# Patient Record
Sex: Female | Born: 1990 | Race: White | Hispanic: No | Marital: Married | Smoking: Never smoker
Health system: Southern US, Community
[De-identification: ages and names within clinical notes are randomized; demographics above are authoritative.]

## PROBLEM LIST (undated history)

## (undated) ENCOUNTER — Inpatient Hospital Stay: Payer: Self-pay

## (undated) DIAGNOSIS — Z87442 Personal history of urinary calculi: Secondary | ICD-10-CM

## (undated) DIAGNOSIS — F32A Depression, unspecified: Secondary | ICD-10-CM

## (undated) DIAGNOSIS — N200 Calculus of kidney: Secondary | ICD-10-CM

## (undated) DIAGNOSIS — F419 Anxiety disorder, unspecified: Secondary | ICD-10-CM

## (undated) DIAGNOSIS — O26839 Pregnancy related renal disease, unspecified trimester: Secondary | ICD-10-CM

## (undated) DIAGNOSIS — D649 Anemia, unspecified: Secondary | ICD-10-CM

## (undated) HISTORY — DX: Pregnancy related renal disease, unspecified trimester: O26.839

## (undated) HISTORY — PX: WISDOM TOOTH EXTRACTION: SHX21

## (undated) HISTORY — DX: Calculus of kidney: N20.0

---

## 2014-09-12 ENCOUNTER — Emergency Department
Admission: EM | Admit: 2014-09-12 | Discharge: 2014-09-12 | Disposition: A | Discharge status: Home / Self Care | Payer: Managed Care, Other (non HMO) | Attending: Emergency Medicine | Admitting: Emergency Medicine

## 2014-09-12 ENCOUNTER — Encounter: Payer: Self-pay | Admitting: *Deleted

## 2014-09-12 ENCOUNTER — Emergency Department: Payer: Managed Care, Other (non HMO)

## 2014-09-12 DIAGNOSIS — R1032 Left lower quadrant pain: Secondary | ICD-10-CM | POA: Diagnosis present

## 2014-09-12 DIAGNOSIS — R319 Hematuria, unspecified: Secondary | ICD-10-CM

## 2014-09-12 DIAGNOSIS — Z79899 Other long term (current) drug therapy: Secondary | ICD-10-CM | POA: Diagnosis not present

## 2014-09-12 DIAGNOSIS — N200 Calculus of kidney: Secondary | ICD-10-CM

## 2014-09-12 DIAGNOSIS — Z3202 Encounter for pregnancy test, result negative: Secondary | ICD-10-CM | POA: Insufficient documentation

## 2014-09-12 DIAGNOSIS — R109 Unspecified abdominal pain: Secondary | ICD-10-CM

## 2014-09-12 LAB — URINALYSIS COMPLETE WITH MICROSCOPIC (ARMC ONLY)
Bilirubin Urine: NEGATIVE
GLUCOSE, UA: NEGATIVE mg/dL
Leukocytes, UA: NEGATIVE
Nitrite: NEGATIVE
Protein, ur: 30 mg/dL — AB
SPECIFIC GRAVITY, URINE: 1.024 (ref 1.005–1.030)
pH: 7 (ref 5.0–8.0)

## 2014-09-12 LAB — COMPREHENSIVE METABOLIC PANEL
ALT: 37 U/L (ref 14–54)
AST: 27 U/L (ref 15–41)
Albumin: 4.8 g/dL (ref 3.5–5.0)
Alkaline Phosphatase: 71 U/L (ref 38–126)
Anion gap: 13 (ref 5–15)
BUN: 16 mg/dL (ref 6–20)
CALCIUM: 9.7 mg/dL (ref 8.9–10.3)
CO2: 23 mmol/L (ref 22–32)
Chloride: 105 mmol/L (ref 101–111)
Creatinine, Ser: 0.89 mg/dL (ref 0.44–1.00)
GFR calc Af Amer: >60 mL/min (ref >60)
GFR calc non Af Amer: >60 mL/min (ref >60)
Glucose, Bld: 106 mg/dL — ABNORMAL HIGH (ref 65–99)
Potassium: 3.6 mmol/L (ref 3.5–5.1)
Sodium: 141 mmol/L (ref 135–145)
Total Bilirubin: 1.7 mg/dL — ABNORMAL HIGH (ref 0.3–1.2)
Total Protein: 7.9 g/dL (ref 6.5–8.1)

## 2014-09-12 LAB — CBC
HEMATOCRIT: 41.9 % (ref 35.0–47.0)
HEMOGLOBIN: 14.1 g/dL (ref 12.0–16.0)
MCH: 29.4 pg (ref 26.0–34.0)
MCHC: 33.6 g/dL (ref 32.0–36.0)
MCV: 87.4 fL (ref 80.0–100.0)
Platelets: 342 10*3/uL (ref 150–440)
RBC: 4.8 MIL/uL (ref 3.80–5.20)
RDW: 12.3 % (ref 11.5–14.5)
WBC: 7.9 10*3/uL (ref 3.6–11.0)

## 2014-09-12 LAB — POCT PREGNANCY, URINE: PREG TEST UR: NEGATIVE

## 2014-09-12 MED ORDER — HYDROCODONE-ACETAMINOPHEN 5-325 MG PO TABS: 1.0000 | ORAL_TABLET | ORAL | Status: DC | PRN

## 2014-09-12 MED ORDER — ACYCLOVIR 400 MG PO TABS: 800.0000 mg | ORAL_TABLET | Freq: Every day | ORAL | Status: DC

## 2014-09-12 MED ORDER — ONDANSETRON HCL 4 MG/2ML IJ SOLN
4.0000 mg | Freq: Once | INTRAMUSCULAR | Status: AC
  Administered 2014-09-12: 4 mg via INTRAVENOUS

## 2014-09-12 MED ORDER — KETOROLAC TROMETHAMINE 10 MG PO TABS: 10.0000 mg | ORAL_TABLET | Freq: Three times a day (TID) | ORAL | Status: DC

## 2014-09-12 MED ORDER — SODIUM CHLORIDE 0.9 % IV SOLN
1000.0000 mL | Freq: Once | INTRAVENOUS | Status: AC
  Administered 2014-09-12: 1000 mL via INTRAVENOUS

## 2014-09-12 MED ORDER — ONDANSETRON HCL 4 MG/2ML IJ SOLN
INTRAMUSCULAR | Status: AC
  Filled 2014-09-12: qty 2

## 2014-09-12 MED ORDER — KETOROLAC TROMETHAMINE 60 MG/2ML IM SOLN
INTRAMUSCULAR | Status: AC
  Filled 2014-09-12: qty 2

## 2014-09-12 MED ORDER — KETOROLAC TROMETHAMINE 30 MG/ML IJ SOLN
30.0000 mg | Freq: Once | INTRAMUSCULAR | Status: AC
  Administered 2014-09-12: 30 mg via INTRAVENOUS

## 2014-09-12 NOTE — ED Notes (Signed)
Has ahd same pain off and on for past month, states when she has bm pain lessons, pain is worse now, had bm yesterday but pain started later

## 2014-09-12 NOTE — ED Notes (Signed)
States pain much improved, has vomired twice, face has more color in it

## 2014-09-12 NOTE — ED Provider Notes (Signed)
[facility] Emergency Department Provider Note ____________________________________________  Time seen: 1555  I have reviewed the triage vital signs and the nursing notes.  HISTORY  Chief Complaint Flank Pain    HPI Sherry Townsend is a 24 y.o. female who reports to the ED after the direct referral from the [facility], sighting abdominal pain, nausea, and vomiting, with sudden onset this morning. She reports her last oral intake was [employer] meal last night, she went to bed without discomfort, and awoke initially without any discomfort. She describes pain to the left lower quadrant with some referral into the left pelvic region as well. Her pain at onset was 8 out of 10, and now after initial triage fluids and IV medications she rates her pain at a 1 out of 10. She noted 3 episodes of nausea and vomiting today. She states that this pain is not related to her and denies any known history of kidneys stones, ovarian cysts, endometriosis or uterine fibroids.  History reviewed. No pertinent past medical history.  There are no active problems to display for this patient.  History reviewed. No pertinent past surgical history.  Current Outpatient Rx  Name  Route  Sig  Dispense  Refill  . acyclovir (ZOVIRAX) 400 MG tablet   Oral   Take 2 tablets (800 mg total) by mouth 5 (five) times daily.   70 tablet   0   . HYDROcodone-acetaminophen (NORCO) 5-325 MG per tablet   Oral   Take 1 tablet by mouth every 4 (four) hours as needed for moderate pain.   6 tablet   0   . ketorolac (TORADOL) 10 MG tablet   Oral   Take 1 tablet (10 mg total) by mouth every 8 (eight) hours.   15 tablet   0    Allergies Review of patient's allergies indicates no known allergies.  No family history on file.  Social History History  Substance Use Topics  . Smoking status: Never Smoker   . Smokeless tobacco: Not on file  . Alcohol Use: Yes   Review of  Systems  Constitutional: Negative for fever. Eyes: Negative for visual changes. ENT: Negative for sore throat. Cardiovascular: Negative for chest pain. Respiratory: Negative for shortness of breath. Gastrointestinal: Positive for abdominal pain, nausea and vomiting, which are intermittent and currently resolved. No diarrhea. Genitourinary: Negative for dysuria. Musculoskeletal: Negative for back pain. Skin: Negative for rash. Neurological: Negative for headaches, focal weakness or numbness. ____________________________________________  PHYSICAL EXAM:  VITAL SIGNS: ED Triage Vitals  Enc Vitals Group     BP 09/12/14 1000 115/65 mmHg     Pulse Rate 09/12/14 1000 95     Resp --      Temp 09/12/14 1000 97.4 F (36.3 C)     Temp Source 09/12/14 1000 Oral     SpO2 09/12/14 1000 100 %     Weight 09/12/14 1000 140 lb (63.504 kg)     Height 09/12/14 1000  (1.626 m)     Head Cir --      Peak Flow --      Pain Score 09/12/14 1458 2     Pain Loc --      Pain Edu? --      Excl. in GC? --    Constitutional: Alert and oriented. Well appearing and in no distress. Eyes: Conjunctivae are normal. PERRL. Normal extraocular movements. ENT   Head: Normocephalic and atraumatic.   Nose: No congestion/rhinnorhea.   Mouth/Throat: Mucous membranes are  moist.   Neck: No stridor. Hematological/Lymphatic/Immunilogical: No cervical lymphadenopathy. Cardiovascular: Normal rate, regular rhythm.  Respiratory: Normal respiratory effort.No wheezes/rales/rhonchi. Gastrointestinal: Soft and nontender. No distention. Minimal tenderness to deep palpation over the left lower quad into the left pelvic region. No CVA tenderness. Musculoskeletal: Nontender with normal range of motion in all extremities. Neurologic:  Normal speech and language. No gross focal neurologic deficits are appreciated. Skin:  Skin is warm, dry and intact. No rash noted. Psychiatric: Mood and affect are normal. Patient  exhibits appropriate insight and judgment. ___________________________________________   LABS (pertinent positives/negatives) Labs Reviewed  COMPREHENSIVE METABOLIC PANEL - Abnormal; Notable for the following:    Glucose, Bld 106 (*)    Total Bilirubin 1.7 (*)    All other components within normal limits  URINALYSIS COMPLETEWITH MICROSCOPIC ([facility] ONLY) - Abnormal; Notable for the following:    Color, Urine YELLOW (*)    APPearance HAZY (*)    Ketones, ur TRACE (*)    Hgb urine dipstick 2+ (*)    Protein, ur 30 (*)    Bacteria, UA RARE (*)    Squamous Epithelial / LPF 0-5 (*)    All other components within normal limits  CBC  POC URINE PREG, ED  POCT PREGNANCY, URINE  ____________________________________________   CT Abd/Pelvis - Stone Protocol IMPRESSION: Bilateral nephrolithiasis. No ureteral stones or hydronephrosis ____________________________________________  PROCEDURES  Zofran 4 mg IVP x 2 NS 1000 ml bolus Toradol 30 mg IVP ____________________________________________  INITIAL IMPRESSION / ASSESSMENT AND PLAN / ED COURSE  Microscopic hematuria and left flank pain likely due to kidney stones. Radiology results confirm renal stones bilaterally, not greater than 7mm, without obstruction. Patient reports complete resolution of pain at time of discharge. Patient given home management instructions and Norco, Toradol, and Zofran for acute or recurrent symptoms.  ____________________________________________  FINAL CLINICAL IMPRESSION(S) / ED DIAGNOSES  Final diagnoses:  Nephrolithiasis     Lissa HoardJenise V Bacon Gwenna Fuston, PA-C 09/12/14 1846  Governor Rooksebecca Lord, MD 09/13/14 [phone]

## 2014-09-12 NOTE — Discharge Instructions (Signed)
Kidney Stones °Kidney stones (urolithiasis) are deposits that form inside your kidneys. The intense pain is caused by the stone moving through the urinary tract. When the stone moves, the ureter goes into spasm around the stone. The stone is usually passed in the urine.  °CAUSES  °· A disorder that makes certain neck glands produce too much parathyroid hormone (primary hyperparathyroidism). °· A buildup of uric acid crystals, similar to gout in your joints. °· Narrowing (stricture) of the ureter. °· A kidney obstruction present at birth (congenital obstruction). °· Previous surgery on the kidney or ureters. °· Numerous kidney infections. °SYMPTOMS  °· Feeling sick to your stomach (nauseous). °· Throwing up (vomiting). °· Blood in the urine (hematuria). °· Pain that usually spreads (radiates) to the groin. °· Frequency or urgency of urination. °DIAGNOSIS  °· Taking a history and physical exam. °· Blood or urine tests. °· CT scan. °· Occasionally, an examination of the inside of the urinary bladder (cystoscopy) is performed. °TREATMENT  °· Observation. °· Increasing your fluid intake. °· Extracorporeal shock wave lithotripsy--This is a noninvasive procedure that uses shock waves to break up kidney stones. °· Surgery may be needed if you have severe pain or persistent obstruction. There are various surgical procedures. Most of the procedures are performed with the use of small instruments. Only small incisions are needed to accommodate these instruments, so recovery time is minimized. °The size, location, and chemical composition are all important variables that will determine the proper choice of action for you. Talk to your health care provider to better understand your situation so that you will minimize the risk of injury to yourself and your kidney.  °HOME CARE INSTRUCTIONS  °· Drink enough water and fluids to keep your urine clear or pale yellow. This will help you to pass the stone or stone fragments. °· Strain  all urine through the provided strainer. Keep all particulate matter and stones for your health care provider to see. The stone causing the pain may be as small as a grain of salt. It is very important to use the strainer each and every time you pass your urine. The collection of your stone will allow your health care provider to analyze it and verify that a stone has actually passed. The stone analysis will often identify what you can do to reduce the incidence of recurrences. °· Only take over-the-counter or prescription medicines for pain, discomfort, or fever as directed by your health care provider. °· Make a follow-up appointment with your health care provider as directed. °· Get follow-up X-rays if required. The absence of pain does not always mean that the stone has passed. It may have only stopped moving. If the urine remains completely obstructed, it can cause loss of kidney function or even complete destruction of the kidney. It is your responsibility to make sure X-rays and follow-ups are completed. Ultrasounds of the kidney can show blockages and the status of the kidney. Ultrasounds are not associated with any radiation and can be performed easily in a matter of minutes. °SEEK MEDICAL CARE IF: °· You experience pain that is progressive and unresponsive to any pain medicine you have been prescribed. °SEEK IMMEDIATE MEDICAL CARE IF:  °· Pain cannot be controlled with the prescribed medicine. °· You have a fever or shaking chills. °· The severity or intensity of pain increases over 18 hours and is not relieved by pain medicine. °· You develop a new onset of abdominal pain. °· You feel faint or pass out. °·   You are unable to urinate. MAKE SURE YOU:   Understand these instructions.  Will watch your condition.  Will get help right away if you are not doing well or get worse. Document Released: 03/28/2005 Document Revised: 11/28/2012 Document Reviewed: 08/29/2012 [facility] Patient Information 2015  [place]. This information is not intended to replace advice given to you by your health care provider. Make sure you discuss any questions you have with your health care provider.  Your CT scan confirmed kidney stones on both sides.  The largest measures 7mm, and the other 5mm.  These should pass without much difficulty.  Take the prescription meds as directed.  [facility] your provider [facility]. Hydrate well enough to keep urine clear. Strain urine if symptoms return.

## 2014-09-13 ENCOUNTER — Telehealth: Payer: Self-pay | Admitting: Emergency Medicine

## 2014-09-13 ENCOUNTER — Other Ambulatory Visit: Payer: Self-pay | Admitting: Emergency Medicine

## 2014-09-13 MED ORDER — ONDANSETRON HCL 4 MG PO TABS: 4.0000 mg | ORAL_TABLET | Freq: Three times a day (TID) | ORAL | Status: DC | PRN

## 2014-09-13 NOTE — ED Provider Notes (Signed)
-----------------------------------------   10:06 AM on 09/13/2014 -----------------------------------------  Nurse received phone call from patient patient was dispensed a prescription for acyclovir, however I reviewed the chart and there was no indication that this wasn't intended prescription. The a second or pressure and was canceled. The PAs note indicated the patient should receive Zofran and she did not. I did write for this prescription and the nurse did call this information. I also reviewed the chart closely and added on a urine culture given the presumptive diagnosis was possible kidney stone given the microhematuria but no ureterolithiasis seen specifically on the CT scan I chose to not on the urine culture.  Governor Rooksebecca Edwin Cherian, MD 09/13/14 1007

## 2014-09-14 LAB — URINE CULTURE

## 2015-04-12 NOTE — L&D Delivery Note (Signed)
Obstetrical Delivery Note   Date of Delivery:   09/20/2015 Primary OB:   [facility] Gestational Age/EDD: 2267w0d (Dated by 7wk3d ultrasound) Antepartum complications: anemia and nephrolithiasis  Delivered By:   Farrel ConnersGUTIERREZ, Jamilla Galli, CNM  Delivery Type:   spontaneous vaginal delivery after unsuccessful vacuum assisted attempt from outlet with two pulls and two pop offs. Procedure Details:   Called to see patient as fetal tachycardia had worsened to 200-210. Patient had already received a dose of Ampicillin and Gentamycin as well as 650  Mgm Tylenol. Station was +3. After bladder was drained of urine a flat Kiwi vacuum was applied to the fetal head. Vacuum was abandoned after two pulls and two pop offs. With good maternal effort, a viable female infant was born at [id number] in LOP with left nuchal hand. Baby was trying to cry and grimace and nares and mouth were suctioned with a bulb and the cord was clamped and then cut by FOB. Baby to warmer and awaiting neonatal team. Cord gases were obtained. Placenta delivered spontaneously intact. Uterine atony followed and was resolved with evacuation of clots from the lower uterine segment, bimanual, IV Pitocin and Cytotec 800 mcg PR. Anesthesia:    epidural and local for repair Intrapartum complications: Chorio/ thick meconium stained amniotic fluid/ fetal tachycardia/ maternal fever GBS:    negative Laceration:    Periurethral on left, superficial and small lac at introitus repaired for hemostasis. Episiotomy:    none Placenta:    Via active 3rd stage. To pathology: yes Estimated Blood Loss:  450ml  Baby:    Liveborn female, Apgars 6/8, weight 3600 gms (7#15oz)/ Margot AblesParker    Penni Penado, Jill SideOLLEEN, CNM

## 2015-04-20 ENCOUNTER — Emergency Department
Admission: EM | Admit: 2015-04-20 | Discharge: 2015-04-20 | Disposition: A | Discharge status: Home / Self Care | Payer: Managed Care, Other (non HMO) | Attending: Emergency Medicine | Admitting: Emergency Medicine

## 2015-04-20 ENCOUNTER — Encounter: Payer: Self-pay | Admitting: Emergency Medicine

## 2015-04-20 ENCOUNTER — Emergency Department: Payer: Managed Care, Other (non HMO)

## 2015-04-20 DIAGNOSIS — Z3A19 19 weeks gestation of pregnancy: Secondary | ICD-10-CM | POA: Insufficient documentation

## 2015-04-20 DIAGNOSIS — O209 Hemorrhage in early pregnancy, unspecified: Secondary | ICD-10-CM | POA: Diagnosis not present

## 2015-04-20 DIAGNOSIS — Z791 Long term (current) use of non-steroidal anti-inflammatories (NSAID): Secondary | ICD-10-CM | POA: Insufficient documentation

## 2015-04-20 DIAGNOSIS — N939 Abnormal uterine and vaginal bleeding, unspecified: Secondary | ICD-10-CM

## 2015-04-20 DIAGNOSIS — Z3490 Encounter for supervision of normal pregnancy, unspecified, unspecified trimester: Secondary | ICD-10-CM

## 2015-04-20 HISTORY — DX: Calculus of kidney: N20.0

## 2015-04-20 LAB — URINALYSIS COMPLETE WITH MICROSCOPIC (ARMC ONLY)
BILIRUBIN URINE: NEGATIVE
Bacteria, UA: NONE SEEN
Glucose, UA: NEGATIVE mg/dL
KETONES UR: NEGATIVE mg/dL
Nitrite: NEGATIVE
Protein, ur: 30 mg/dL — AB
Specific Gravity, Urine: 1.006 (ref 1.005–1.030)
pH: 6 (ref 5.0–8.0)

## 2015-04-20 LAB — COMPREHENSIVE METABOLIC PANEL
ALT: 33 U/L (ref 14–54)
AST: 26 U/L (ref 15–41)
Albumin: 4.3 g/dL (ref 3.5–5.0)
Alkaline Phosphatase: 78 U/L (ref 38–126)
Anion gap: 5 (ref 5–15)
BILIRUBIN TOTAL: 0.5 mg/dL (ref 0.3–1.2)
BUN: 7 mg/dL (ref 6–20)
CO2: 25 mmol/L (ref 22–32)
CREATININE: 0.55 mg/dL (ref 0.44–1.00)
Calcium: 10.1 mg/dL (ref 8.9–10.3)
Chloride: 106 mmol/L (ref 101–111)
GFR calc Af Amer: >60 mL/min (ref >60)
Glucose, Bld: 98 mg/dL (ref 65–99)
Potassium: 3.4 mmol/L — ABNORMAL LOW (ref 3.5–5.1)
Sodium: 136 mmol/L (ref 135–145)
TOTAL PROTEIN: 7.7 g/dL (ref 6.5–8.1)

## 2015-04-20 LAB — CBC WITH DIFFERENTIAL/PLATELET
BASOS ABS: 0 10*3/uL (ref 0–0.1)
Basophils Relative: 0 %
Eosinophils Absolute: 0.2 10*3/uL (ref 0–0.7)
Eosinophils Relative: 1 %
HCT: 35.4 % (ref 35.0–47.0)
Hemoglobin: 12.1 g/dL (ref 12.0–16.0)
LYMPHS ABS: 2.5 10*3/uL (ref 1.0–3.6)
Lymphocytes Relative: 17 %
MCH: 29.8 pg (ref 26.0–34.0)
MCHC: 34.1 g/dL (ref 32.0–36.0)
MCV: 87.5 fL (ref 80.0–100.0)
Monocytes Absolute: 0.9 10*3/uL (ref 0.2–0.9)
Monocytes Relative: 6 %
Neutro Abs: 11.4 10*3/uL — ABNORMAL HIGH (ref 1.4–6.5)
Neutrophils Relative %: 76 %
Platelets: 289 10*3/uL (ref 150–440)
RBC: 4.05 MIL/uL (ref 3.80–5.20)
RDW: 12.4 % (ref 11.5–14.5)
WBC: 15 10*3/uL — AB (ref 3.6–11.0)

## 2015-04-20 LAB — WET PREP, GENITAL
Clue Cells Wet Prep HPF POC: NONE SEEN
SPERM: NONE SEEN
Trich, Wet Prep: NONE SEEN
Yeast Wet Prep HPF POC: NONE SEEN

## 2015-04-20 MED ORDER — CEPHALEXIN 500 MG PO CAPS: 500.0000 mg | ORAL_CAPSULE | Freq: Three times a day (TID) | ORAL | Status: DC

## 2015-04-20 MED ORDER — CEPHALEXIN 500 MG PO CAPS
500.0000 mg | ORAL_CAPSULE | Freq: Once | ORAL | Status: AC
  Administered 2015-04-20: 500 mg via ORAL

## 2015-04-20 MED ORDER — CEPHALEXIN 500 MG PO CAPS
ORAL_CAPSULE | ORAL | Status: AC
  Administered 2015-04-20: 500 mg via ORAL
  Filled 2015-04-20: qty 1

## 2015-04-20 NOTE — ED Provider Notes (Signed)
[facility] Emergency Department Provider Note   ____________________________________________  Time seen: 2045  I have reviewed the triage vital signs and the nursing notes.   HISTORY  Chief Complaint Hematuria   History limited by: Not Limited   HPI Sherry Townsend is a 25 y.o. female at roughly [redacted] weeks pregnant who presents to the emergency department today because of concern for possible vaginal bleeding. The patient states that she has started noticing bright red blood. She is primarily notices and she wipes after urinating. The symptoms started yesterday. It has been intermittent. She does think it is coming below the urethral opening. She has additionally started having some lower abdominal cramping. She does have a history of kidney stones but states this does not feel similar to that. No fevers.     Past Medical History  Diagnosis Date  . Kidney stone     There are no active problems to display for this patient.   History reviewed. No pertinent past surgical history.  Current Outpatient Rx  Name  Route  Sig  Dispense  Refill  . HYDROcodone-acetaminophen (NORCO) 5-325 MG per tablet   Oral   Take 1 tablet by mouth every 4 (four) hours as needed for moderate pain.   6 tablet   0   . ketorolac (TORADOL) 10 MG tablet   Oral   Take 1 tablet (10 mg total) by mouth every 8 (eight) hours.   15 tablet   0   . ondansetron (ZOFRAN) 4 MG tablet   Oral   Take 1 tablet (4 mg total) by mouth every 8 (eight) hours as needed for nausea or vomiting.   10 tablet   0     Allergies Review of patient's allergies indicates no known allergies.  History reviewed. No pertinent family history.  Social History Social History  Substance Use Topics  . Smoking status: Never Smoker   . Smokeless tobacco: None  . Alcohol Use: Yes    Review of Systems  Constitutional: Negative for fever. Cardiovascular: Negative for chest pain. Respiratory:  Negative for shortness of breath. Gastrointestinal: Negative for abdominal pain, vomiting and diarrhea. Neurological: Negative for headaches, focal weakness or numbness.   10-point ROS otherwise negative.  ____________________________________________   PHYSICAL EXAM:  VITAL SIGNS: ED Triage Vitals  Enc Vitals Group     BP 04/20/15 1820 139/75 mmHg     Pulse Rate 04/20/15 1820 110     Resp 04/20/15 1820 20     Temp 04/20/15 1820 98 F (36.7 C)     Temp Source 04/20/15 1820 Oral     SpO2 04/20/15 1820 100 %     Weight 04/20/15 1820 141 lb (63.957 kg)     Height 04/20/15 1820 5\' 4"  (1.626 m)     Head Cir --      Peak Flow --      Pain Score 04/20/15 1821 4   Constitutional: Alert and oriented. Well appearing and in no distress. Eyes: Conjunctivae are normal. PERRL. Normal extraocular movements. ENT   Head: Normocephalic and atraumatic.   Nose: No congestion/rhinnorhea.   Mouth/Throat: Mucous membranes are moist.   Neck: No stridor. Hematological/Lymphatic/Immunilogical: No cervical lymphadenopathy. Cardiovascular: Normal rate, regular rhythm.  No murmurs, rubs, or gallops. Respiratory: Normal respiratory effort without tachypnea nor retractions. Breath sounds are clear and equal bilaterally. No wheezes/rales/rhonchi. Gastrointestinal: Soft and nontender. No distention. There is no CVA tenderness. Genitourinary: No external lesions. No blood or clots in vaginal vault. No CMT.  No adnexal fullness or tenderness.  Musculoskeletal: Normal range of motion in all extremities. No joint effusions.  No lower extremity tenderness nor edema. Neurologic:  Normal speech and language. No gross focal neurologic deficits are appreciated.  Skin:  Skin is warm, dry and intact. No rash noted. Psychiatric: Mood and affect are normal. Speech and behavior are normal. Patient exhibits appropriate insight and judgment.  ____________________________________________    LABS (pertinent  positives/negatives)  Labs Reviewed  WET PREP, GENITAL - Abnormal; Notable for the following:    WBC, Wet Prep HPF POC FEW (*)    All other components within normal limits  CBC WITH DIFFERENTIAL/PLATELET - Abnormal; Notable for the following:    WBC 15.0 (*)    Neutro Abs 11.4 (*)    All other components within normal limits  COMPREHENSIVE METABOLIC PANEL - Abnormal; Notable for the following:    Potassium 3.4 (*)    All other components within normal limits  URINALYSIS COMPLETEWITH MICROSCOPIC ([facility] ONLY) - Abnormal; Notable for the following:    Color, Urine YELLOW (*)    APPearance CLEAR (*)    Hgb urine dipstick 3+ (*)    Protein, ur 30 (*)    Leukocytes, UA 1+ (*)    Squamous Epithelial / LPF 6-30 (*)    All other components within normal limits  CHLAMYDIA/NGC RT PCR ([facility] ONLY)  ABO/RH     ____________________________________________   EKG  None  ____________________________________________    RADIOLOGY  US  IMPRESSION: Single live intrauterine pregnancy estimated gestational age [redacted] weeks 6 days for estimated date of delivery 09/08/2015.  This exam is performed on an emergent basis and does not comprehensively evaluate fetal size, dating, or anatomy; follow-up complete OB US should be considered if further fetal assessment is warranted.   ____________________________________________   PROCEDURES  Procedure(s) performed: None  Critical Care performed: No  ____________________________________________   INITIAL IMPRESSION / ASSESSMENT AND PLAN / ED COURSE  Pertinent labs & imaging results that were available during my care of the patient were reviewed by me and considered in my medical decision making (see chart for details).  Patient presented to the emergency department today with concerns for vaginal bleeding in the present since of a pregnancy. On exam patient appears well. Pelvic exam without any external lesions or obvious  bleeding. Blood work shows that she is Rh+. Nonanemic. Mild leukocytosis. Additionally urine shows some white blood cells as well as blood. This point I do think that it is unlikely the patient is suffering from kidney stone she does state that the pain is dissimilar to her kidney stone pain. Additionally she does not appear to be in any distress. However given that she did have some white blood cells in urine I will treat for urinary tract infection. Discussed with patient importance of follow-up.  ____________________________________________   FINAL CLINICAL IMPRESSION(S) / ED DIAGNOSES  Final diagnoses:  Abnormal vaginal bleeding  Pregnancy     Phineas SemenGraydon Harry Shuck, MD 04/20/15 2300

## 2015-04-20 NOTE — Discharge Instructions (Signed)
Please seek medical attention for any high fevers, chest pain, shortness of breath, change in behavior, persistent vomiting, bloody stool or any other new or concerning symptoms. ° ° °Prenatal Care °WHAT IS PRENATAL CARE?  °Prenatal care is the process of caring for a pregnant woman before she gives birth. Prenatal care makes sure that she and her baby remain as healthy as possible throughout pregnancy. Prenatal care may be provided by a midwife, family practice health care provider, or a childbirth and pregnancy specialist (obstetrician). Prenatal care may include physical examinations, testing, treatments, and education on nutrition, lifestyle, and social support services. °WHY IS PRENATAL CARE SO IMPORTANT?  °Early and consistent prenatal care increases the chance that you and your baby will remain healthy throughout your pregnancy. This type of care also decreases a baby's risk of being born too early (prematurely), or being born smaller than expected (small for gestational age). Any underlying medical conditions you may have that could pose a risk during your pregnancy are discussed during prenatal care visits. You will also be monitored regularly for any new conditions that may arise during your pregnancy so they can be treated quickly and effectively. °WHAT HAPPENS DURING PRENATAL CARE VISITS? °Prenatal care visits may include the following: °Discussion °Tell your health care provider about any new signs or symptoms you have experienced since your last visit. These might include: °· Nausea or vomiting. °· Increased or decreased level of energy. °· Difficulty sleeping. °· Back or leg pain. °· Weight changes. °· Frequent urination. °· Shortness of breath with physical activity. °· Changes in your skin, such as the development of a rash or itchiness. °· Vaginal discharge or bleeding. °· Feelings of excitement or nervousness. °· Changes in your baby's movements. °You may want to write down any questions or topics  you want to discuss with your health care provider and bring them with you to your appointment. °Examination °During your first prenatal care visit, you will likely have a complete physical exam. Your health care provider will often examine your vagina, cervix, and the position of your uterus, as well as check your heart, lungs, and other body systems. As your pregnancy progresses, your health care provider will measure the size of your uterus and your baby's position inside your uterus. He or she may also examine you for early signs of labor. Your prenatal visits may also include checking your blood pressure and, after about 10-12 weeks of pregnancy, listening to your baby's heartbeat. °Testing °Regular testing often includes: °· Urinalysis. This checks your urine for glucose, protein, or signs of infection. °· Blood count. This checks the levels of white and red blood cells in your body. °· Tests for sexually transmitted infections (STIs). Testing for STIs at the beginning of pregnancy is routinely done and is required in many states. °· Antibody testing. You will be checked to see if you are immune to certain illnesses, such as rubella, that can affect a developing fetus. °· Glucose screen. Around 24-28 weeks of pregnancy, your blood glucose level will be checked for signs of gestational diabetes. Follow-up tests may be recommended. °· Group B strep. This is a bacteria that is commonly found inside a woman's vagina. This test will inform your health care provider if you need an antibiotic to reduce the amount of this bacteria in your body prior to labor and childbirth. °· Ultrasound. Many pregnant women undergo an ultrasound screening around 18-20 weeks of pregnancy to evaluate the health of the fetus and check for   any developmental abnormalities.  HIV (human immunodeficiency virus) testing. Early in your pregnancy, you will be screened for HIV. If you are at high risk for HIV, this test may be repeated during  your third trimester of pregnancy. You may be offered other testing based on your age, personal or family medical history, or other factors.  HOW OFTEN SHOULD I PLAN TO SEE MY HEALTH CARE PROVIDER FOR PRENATAL CARE? Your prenatal care check-up schedule depends on any medical conditions you have before, or develop during, your pregnancy. If you do not have any underlying medical conditions, you will likely be seen for checkups:  Monthly, during the first 6 months of pregnancy.  Twice a month during months 7 and 8 of pregnancy.  Weekly starting in the 9th month of pregnancy and until delivery. If you develop signs of early labor or other concerning signs or symptoms, you may need to see your health care provider more often. Ask your health care provider what prenatal care schedule is best for you. WHAT CAN I DO TO KEEP MYSELF AND MY BABY AS HEALTHY AS POSSIBLE DURING MY PREGNANCY?  Take a prenatal vitamin containing 400 micrograms (0.4 mg) of folic acid every day. Your health care provider may also ask you to take additional vitamins such as iodine, vitamin D, iron, copper, and zinc.  Take 1500-2000 mg of calcium daily starting at your 20th week of pregnancy until you deliver your baby.  Make sure you are up to date on your vaccinations. Unless directed otherwise by your health care provider:  You should receive a tetanus, diphtheria, and pertussis (Tdap) vaccination between the 27th and 36th week of your pregnancy, regardless of when your last Tdap immunization occurred. This helps protect your baby from whooping cough (pertussis) after he or she is born.  You should receive an annual inactivated influenza vaccine (IIV) to help protect you and your baby from influenza. This can be done at any point during your pregnancy.  Eat a well-rounded diet that includes:  Fresh fruits and vegetables.  Lean proteins.  Calcium-rich foods such as milk, yogurt, hard cheeses, and dark, leafy  greens.  Whole grain breads.  Do noteat seafood high in mercury, including:  Swordfish.  Tilefish.  Shark.  King mackerel.  More than 6 oz tuna per week.  Do not eat:  Raw or undercooked meats or eggs.  Unpasteurized foods, such as soft cheeses (brie, blue, or feta), juices, and milks.  Lunch meats.  Hot dogs that have not been heated until they are steaming.  Drink enough water to keep your urine clear or pale yellow. For many women, this may be 10 or more 8 oz glasses of water each day. Keeping yourself hydrated helps deliver nutrients to your baby and may prevent the start of pre-term uterine contractions.  Do not use any tobacco products including cigarettes, chewing tobacco, or electronic cigarettes. If you need help quitting, ask your health care provider.  Do not drink beverages containing alcohol. No safe level of alcohol consumption during pregnancy has been determined.  Do not use any illegal drugs. These can harm your developing baby or cause a miscarriage.  Ask your health care provider or pharmacist before taking any prescription or over-the-counter medicines, herbs, or supplements.  Limit your caffeine intake to no more than 200 mg per day.  Exercise. Unless told otherwise by your health care provider, try to get 30 minutes of moderate exercise most days of the week. Do not  do high-impact  activities, contact sports, or activities with a high risk of falling, such as horseback riding or downhill skiing.  Get plenty of rest.  Avoid anything that raises your body temperature, such as hot tubs and saunas.  If you own a cat, do not empty its litter box. Bacteria contained in cat feces can cause an infection called toxoplasmosis. This can result in serious harm to the fetus.  Stay away from chemicals such as insecticides, lead, mercury, and cleaning or paint products that contain solvents.  Do not have any X-rays taken unless medically necessary.  Take a  childbirth and breastfeeding preparation class. Ask your health care provider if you need a referral or recommendation.   This information is not intended to replace advice given to you by your health care provider. Make sure you discuss any questions you have with your health care provider.   Document Released: 03/31/2003 Document Revised: 04/18/2014 Document Reviewed: 06/12/2013 Elsevier Interactive Patient Education [employer].

## 2015-04-20 NOTE — ED Notes (Signed)
Pelvic cart set up in pt exam room.

## 2015-04-20 NOTE — ED Notes (Signed)
Reports bright red blood noted when she wipes after urinating. [redacted] wks pregnant.

## 2015-04-21 LAB — ABO/RH: ABO/RH(D): B POS

## 2015-04-21 LAB — CHLAMYDIA/NGC RT PCR (ARMC ONLY)
CHLAMYDIA TR: NOT DETECTED
N gonorrhoeae: NOT DETECTED

## 2015-05-12 ENCOUNTER — Encounter: Payer: Self-pay | Admitting: *Deleted

## 2015-05-12 ENCOUNTER — Inpatient Hospital Stay
Admission: EM | Admit: 2015-05-12 | Discharge: 2015-05-14 | DRG: 781 | Disposition: A | Discharge status: Home / Self Care | Payer: Managed Care, Other (non HMO) | Attending: Obstetrics and Gynecology | Admitting: Obstetrics and Gynecology

## 2015-05-12 DIAGNOSIS — Z3A22 22 weeks gestation of pregnancy: Secondary | ICD-10-CM

## 2015-05-12 DIAGNOSIS — R109 Unspecified abdominal pain: Secondary | ICD-10-CM | POA: Diagnosis present

## 2015-05-12 DIAGNOSIS — O26892 Other specified pregnancy related conditions, second trimester: Principal | ICD-10-CM | POA: Diagnosis present

## 2015-05-12 DIAGNOSIS — D72829 Elevated white blood cell count, unspecified: Secondary | ICD-10-CM | POA: Diagnosis present

## 2015-05-12 DIAGNOSIS — N200 Calculus of kidney: Secondary | ICD-10-CM

## 2015-05-12 DIAGNOSIS — O26839 Pregnancy related renal disease, unspecified trimester: Secondary | ICD-10-CM

## 2015-05-12 DIAGNOSIS — N132 Hydronephrosis with renal and ureteral calculous obstruction: Secondary | ICD-10-CM | POA: Diagnosis present

## 2015-05-12 DIAGNOSIS — R10A1 Flank pain, right side: Secondary | ICD-10-CM | POA: Diagnosis present

## 2015-05-12 DIAGNOSIS — Z87442 Personal history of urinary calculi: Secondary | ICD-10-CM

## 2015-05-12 LAB — CBC
HEMATOCRIT: 30.2 % — AB (ref 35.0–47.0)
HEMOGLOBIN: 10.3 g/dL — AB (ref 12.0–16.0)
MCH: 29.9 pg (ref 26.0–34.0)
MCHC: 34.3 g/dL (ref 32.0–36.0)
MCV: 87.3 fL (ref 80.0–100.0)
Platelets: 270 10*3/uL (ref 150–440)
RBC: 3.46 MIL/uL — ABNORMAL LOW (ref 3.80–5.20)
RDW: 12.7 % (ref 11.5–14.5)
WBC: 19.7 10*3/uL — ABNORMAL HIGH (ref 3.6–11.0)

## 2015-05-12 LAB — URINALYSIS COMPLETE WITH MICROSCOPIC (ARMC ONLY)
Bilirubin Urine: NEGATIVE
GLUCOSE, UA: NEGATIVE mg/dL
Leukocytes, UA: NEGATIVE
Nitrite: NEGATIVE
PH: 6 (ref 5.0–8.0)
PROTEIN: 30 mg/dL — AB
SPECIFIC GRAVITY, URINE: 1.031 — AB (ref 1.005–1.030)

## 2015-05-12 LAB — BASIC METABOLIC PANEL
ANION GAP: 9 (ref 5–15)
BUN: 10 mg/dL (ref 6–20)
CALCIUM: 9.2 mg/dL (ref 8.9–10.3)
CO2: 20 mmol/L — AB (ref 22–32)
Chloride: 107 mmol/L (ref 101–111)
Creatinine, Ser: 0.9 mg/dL (ref 0.44–1.00)
GFR calc Af Amer: >60 mL/min (ref >60)
GFR calc non Af Amer: >60 mL/min (ref >60)
GLUCOSE: 86 mg/dL (ref 65–99)
Potassium: 3.6 mmol/L (ref 3.5–5.1)
Sodium: 136 mmol/L (ref 135–145)

## 2015-05-12 MED ORDER — DOCUSATE SODIUM 100 MG PO CAPS: 100.0000 mg | ORAL_CAPSULE | Freq: Two times a day (BID) | ORAL | Status: DC | PRN

## 2015-05-12 MED ORDER — SODIUM CHLORIDE 0.9 % IV BOLUS (SEPSIS)
1500.0000 mL | Freq: Once | INTRAVENOUS | Status: AC
Start: 2015-05-12 — End: 2015-05-13
  Administered 2015-05-12: 1500 mL via INTRAVENOUS

## 2015-05-12 MED ORDER — PRENATAL MULTIVITAMIN CH
1.0000 | ORAL_TABLET | Freq: Every day | ORAL | Status: DC
  Administered 2015-05-13: 1 via ORAL
  Filled 2015-05-12: qty 1

## 2015-05-12 MED ORDER — ONDANSETRON HCL 4 MG PO TABS: 8.0000 mg | ORAL_TABLET | Freq: Three times a day (TID) | ORAL | Status: DC | PRN

## 2015-05-12 MED ORDER — OXYCODONE HCL 5 MG PO TABS
5.0000 mg | ORAL_TABLET | Freq: Four times a day (QID) | ORAL | Status: DC | PRN
Start: 2015-05-12 — End: 2015-05-14
  Administered 2015-05-13: 10 mg via ORAL
  Administered 2015-05-13: 5 mg via ORAL
  Administered 2015-05-13: 10 mg via ORAL
  Administered 2015-05-13 (×2): 5 mg via ORAL
  Administered 2015-05-14 (×2): 10 mg via ORAL
  Filled 2015-05-12 (×2): qty 1
  Filled 2015-05-12 (×2): qty 2
  Filled 2015-05-12: qty 1
  Filled 2015-05-12 (×2): qty 2

## 2015-05-12 MED ORDER — SODIUM CHLORIDE 0.9 % IV SOLN: 0.5000 mg/h | INTRAVENOUS | Status: DC

## 2015-05-12 MED ORDER — CALCIUM CARBONATE ANTACID 500 MG PO CHEW: 2.0000 | CHEWABLE_TABLET | ORAL | Status: DC | PRN

## 2015-05-12 MED ORDER — ONDANSETRON HCL 4 MG/2ML IJ SOLN
4.0000 mg | Freq: Four times a day (QID) | INTRAMUSCULAR | Status: DC | PRN
  Administered 2015-05-12 – 2015-05-13 (×2): 4 mg via INTRAVENOUS
  Filled 2015-05-12 (×2): qty 2

## 2015-05-12 MED ORDER — HYDROMORPHONE HCL 1 MG/ML IJ SOLN
0.5000 mg | INTRAMUSCULAR | Status: AC | PRN
  Administered 2015-05-13: 0.5 mg via INTRAVENOUS
  Filled 2015-05-12: qty 1

## 2015-05-12 MED ORDER — HYDROMORPHONE HCL 1 MG/ML IJ SOLN
0.5000 mg | INTRAMUSCULAR | Status: DC | PRN
  Administered 2015-05-12: 0.5 mg via INTRAVENOUS
  Filled 2015-05-12: qty 1

## 2015-05-12 MED ORDER — DIPHENHYDRAMINE HCL 25 MG PO CAPS: 25.0000 mg | ORAL_CAPSULE | Freq: Four times a day (QID) | ORAL | Status: DC | PRN

## 2015-05-12 MED ORDER — CEFTRIAXONE SODIUM 1 G IJ SOLR
1.0000 g | Freq: Once | INTRAMUSCULAR | Status: AC
Start: 2015-05-12 — End: 2015-05-13
  Administered 2015-05-13: 1 g via INTRAVENOUS
  Filled 2015-05-12 (×2): qty 10

## 2015-05-12 MED ORDER — SODIUM CHLORIDE 0.9 % IV SOLN
INTRAVENOUS | Status: AC
  Administered 2015-05-13 (×4): via INTRAVENOUS

## 2015-05-12 MED ORDER — ACETAMINOPHEN 500 MG PO TABS: 1000.0000 mg | ORAL_TABLET | Freq: Four times a day (QID) | ORAL | Status: DC | PRN

## 2015-05-12 NOTE — ED Notes (Signed)
Pt reports right flank pain since 3am today.  Pt also has nausea.  Pt reports hx of kidney stones.  Pt is [redacted] weeks pregnant.   No vag bleeding.

## 2015-05-12 NOTE — OB Triage Note (Signed)
Rcved pt from ER. Found FHR with doppler (160). Signed consents and explained plan of care. Pt verbalized understanding.

## 2015-05-12 NOTE — H&P (Addendum)
Obstetrics Admission History & Physical  05/12/2015 - 10:30 PM Primary OBGYN: [facility]  Chief Complaint: right flank pain  History of Present Illness  25 y.o. G1 @ [redacted]w[redacted]d (Dating: EDC 6/4, dated by 7wk u/s), with the above CC. Pregnancy complicated by: h/o nephrolithiasis.  [name] states that starting last night and then worsened at 0300 today she started having right back/flank pain. She states that it's always there but can increase with sharp feelings. Prior h/o similar pains when she was first diagnosed with stones in the middle of last year. She does have some nausea and vomiting and not much of an appetite, but no fevers, chills, chest pain, SOB, decreased FM, uterine cramping, VB, LOF, dysuria or hematuria. She was seen a few weeks ago in the ER for hematuria and sent on keflex; fetal u/s done at that time was norma. No renal scan was done or a UCx but pelvic exam negative. She has had a normal anatomy scan in the office.   Review of Systems: her 12 point review of systems is negative or as noted in the History of Present Illness.  PMHx:  Past Medical History  Diagnosis Date  . Kidney stone    PSHx: No past surgical history on file. Medications: tylenol, PNV  Allergies: has No Known Allergies. OBHx:  OB History  Gravida Para Term Preterm AB SAB TAB Ectopic Multiple Living  1             # Outcome Date GA Lbr Len/2nd Weight Sex Delivery Anes PTL Lv  1 Current               GYNHx:  History of abnormal pap smears: No. History of STIs: No..             FHx: No family history on file. Soc Hx:  Social History   Social History  . Marital Status: Single    Spouse Name: N/A  . Number of Children: N/A  . Years of Education: N/A   Occupational History  . Not on file.   Social History Main Topics  . Smoking status: Never Smoker   . Smokeless tobacco: Not on file  . Alcohol Use: No  . Drug Use: Not on file  . Sexual Activity: Not on file   Other Topics  Concern  . Not on file   Social History Narrative    Objective    Current Vital Signs 24h Vital Sign Ranges  T 98 F (36.7 C) Temp  Avg: 97.8 F (36.6 C)  Min: 97.5 F (36.4 C)  Max: 98 F (36.7 C)  BP 110/68 mmHg BP  Min: 110/68  Max: 118/64  HR 86 Pulse  Avg: 95  Min: 86  Max: 104  RR 16 Resp  Avg: 17  Min: 16  Max: 18  SaO2 99 % Not Delivered SpO2  Avg: 99.7 %  Min: 99 %  Max: 100 %       24 Hour I/O Current Shift I/O  Time Ins Outs       EFM: normal fetal dopplers   General: Well nourished, well developed female in no acute distress.  Skin:  Warm and dry.  Cardiovascular: Regular rate and rhythm. Respiratory:  Clear to auscultation bilateral. Normal respiratory effort Abdomen: gravid, nttp Back: slightly tender to palpation in low right back and ?for CVAT Neuro/Psych:  Normal mood and affect.   Labs   Recent Labs Lab 05/12/15 1740  NA 136  K 3.6  CL 107  CO2 20*  BUN 10  CREATININE 0.90  GLUCOSE 86  CALCIUM 9.2    Recent Labs Lab 05/12/15 1740  WBC 19.7*  HGB 10.3*  HCT 30.2*  PLT 270   Results for Sherry Townsend, Sherry Townsend (MRN [id number]) as of 05/12/2015 22:32  Ref. Range 05/12/2015 17:40  Appearance Latest Ref Range: CLEAR  CLEAR (A)  Bacteria, UA Latest Ref Range: NONE SEEN  RARE (A)  Bilirubin Urine Latest Ref Range: NEGATIVE  NEGATIVE  Color, Urine Latest Ref Range: YELLOW  YELLOW (A)  Glucose Latest Ref Range: NEGATIVE mg/dL NEGATIVE  Hgb urine dipstick Latest Ref Range: NEGATIVE  2+ (A)  Ketones, ur Latest Ref Range: NEGATIVE mg/dL 2+ (A)  Leukocytes, UA Latest Ref Range: NEGATIVE  NEGATIVE  Mucous Unknown PRESENT  Nitrite Latest Ref Range: NEGATIVE  NEGATIVE  pH Latest Ref Range: 5.0-8.0  6.0  Protein Latest Ref Range: NEGATIVE mg/dL 30 (A)  RBC / HPF Latest Ref Range: 0-5 RBC/hpf TOO NUMEROUS TO C...  Specific Gravity, Urine Latest Ref Range: 1.005-1.030  1.031 (H)  Squamous Epithelial / LPF Latest Ref Range: NONE SEEN  6-30 (A)  WBC, UA  Latest Ref Range: 0-5 WBC/hpf 0-5   Radiology none  Assessment & Plan   25 y.o. G1 @ [redacted]w[redacted]d with possible pyelo vs stone vs GI process. Pt currently stable. Will admit to AP service *IUP: fetal status reassuring. qday PNV. Daily FHTs *Right flank pain: more likely renal process with stone and/or pyelo/ID etiology. Cr and electrolytes normal -Will treat with rocephin 1gm IV x 1 -1.5L NS bolus and then 16mL/h MIVF -Renal u/s ordered -UCx ordered -possibility of appy or GI etiology but low. Will continue to watch for s/s -Rpt CBC ordered for noon tomorrow *Analgesia: IV for breakthrough. PO PRNs with apap and oxycodone ordered *FEN/GI: regular diet. IVFs. anti-emetics ordered *PPx: OOB ad lib. *Dispo: possibly tomorrow if pain under control, s/s are better and CBC is improved. Would consider sending home on UTI ppx abx for rest of pregnancy given high risk for pyelo. I also d/w pt that this can be a recurrent issue and as long as no obstruction, normal labs and s/s are under control, she can be discharged to home. Pt and family amenable to plan  Cornelia Copa. MD [facility] Pager [phone]

## 2015-05-12 NOTE — ED Notes (Signed)
Pt now has right side abd pain.  No vag bleeding.  Pt increased in lower back.  Report called to kelly rn on 3rd floor.

## 2015-05-13 ENCOUNTER — Inpatient Hospital Stay: Payer: Managed Care, Other (non HMO)

## 2015-05-13 DIAGNOSIS — R109 Unspecified abdominal pain: Secondary | ICD-10-CM

## 2015-05-13 LAB — CBC WITH DIFFERENTIAL/PLATELET
BASOS ABS: 0.1 10*3/uL (ref 0–0.1)
Basophils Relative: 0 %
EOS PCT: 1 %
Eosinophils Absolute: 0.1 10*3/uL (ref 0–0.7)
HCT: 26.4 % — ABNORMAL LOW (ref 35.0–47.0)
HEMOGLOBIN: 8.8 g/dL — AB (ref 12.0–16.0)
LYMPHS ABS: 1.3 10*3/uL (ref 1.0–3.6)
LYMPHS PCT: 8 %
MCH: 29.7 pg (ref 26.0–34.0)
MCHC: 33.5 g/dL (ref 32.0–36.0)
MCV: 88.6 fL (ref 80.0–100.0)
Monocytes Absolute: 1.1 10*3/uL — ABNORMAL HIGH (ref 0.2–0.9)
Monocytes Relative: 7 %
NEUTROS ABS: 12.9 10*3/uL — AB (ref 1.4–6.5)
NEUTROS PCT: 84 %
PLATELETS: 221 10*3/uL (ref 150–440)
RBC: 2.98 MIL/uL — AB (ref 3.80–5.20)
RDW: 12.8 % (ref 11.5–14.5)
WBC: 15.4 10*3/uL — AB (ref 3.6–11.0)

## 2015-05-13 MED ORDER — ONDANSETRON HCL 4 MG/2ML IJ SOLN
4.0000 mg | INTRAMUSCULAR | Status: DC | PRN
  Administered 2015-05-13 – 2015-05-14 (×2): 4 mg via INTRAVENOUS
  Filled 2015-05-13 (×2): qty 2

## 2015-05-13 MED ORDER — MEPERIDINE HCL 25 MG/ML IJ SOLN
12.5000 mg | Freq: Once | INTRAMUSCULAR | Status: AC
  Administered 2015-05-13: 12.5 mg via INTRAVENOUS
  Filled 2015-05-13: qty 1

## 2015-05-13 MED ORDER — PROMETHAZINE HCL 25 MG/ML IJ SOLN
12.5000 mg | Freq: Four times a day (QID) | INTRAMUSCULAR | Status: DC | PRN
Start: 2015-05-13 — End: 2015-05-13

## 2015-05-13 MED ORDER — TAMSULOSIN HCL 0.4 MG PO CAPS
0.4000 mg | ORAL_CAPSULE | Freq: Every day | ORAL | Status: DC
  Administered 2015-05-13: 0.4 mg via ORAL
  Filled 2015-05-13 (×2): qty 1

## 2015-05-13 MED ORDER — PROMETHAZINE HCL 25 MG PO TABS
12.5000 mg | ORAL_TABLET | Freq: Four times a day (QID) | ORAL | Status: DC | PRN
  Administered 2015-05-13: 12.5 mg via ORAL
  Filled 2015-05-13: qty 1

## 2015-05-13 MED ORDER — PROMETHAZINE HCL 25 MG RE SUPP: 25.0000 mg | Freq: Four times a day (QID) | RECTAL | Status: DC | PRN

## 2015-05-13 NOTE — Consult Note (Signed)
Urology Consult  I have been asked to see the patient by Dr. Sallee Provencal, for evaluation and management of right flank pain, history of kidney stones.  Chief Complaint: Right flank pain  History of Present Illness: Sherry Townsend is a 25 y.o. year old with a history of bilateral nephrolithiasis who was admitted to the OB/GYN service currently [redacted] weeks pregnant with acute onset right flank pain which started 2 days ago.  On admission, she underwent renal ultrasound showed right severe hydronephrosis with bilateral nephrolithiasis.  She was also noted to have a leukocytosis to 19 upon admission, improved today to 15.  No fevers or chills. She has had significant nausea and vomiting which is improved with pain control.  UA on admission with microscopic hematuria but otherwise no evidence of infection. She was started on empiric antibiotics.  Today, she is doing much better. Her pain is migrated from her right flank down to her right lower quadrant. Her nausea and vomiting have resolved. She remains afebrile, normotensive, without tachycardia.  She does have a personal history of stones. She was seen in the emergency room back in June 2016 with bilateral nonobstructing stones the greatest of which was 7 mm in the right lower pole. She is never occurred intervention for stones.  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   Past Medical History  Diagnosis Date  . Kidney stone     No past surgical history on file.  Home Medications:    Medication List    ASK your doctor about these medications        acetaminophen 500 MG tablet  Commonly known as:  TYLENOL  Take 500 mg by mouth every 6 (six) hours as needed.     cephALEXin 500 MG capsule  Commonly known as:  KEFLEX  Take 1 capsule (500 mg total) by mouth 3 (three) times daily.     HYDROcodone-acetaminophen 5-325 MG tablet  Commonly known as:  NORCO  Take 1 tablet by mouth every 4 (four)  hours as needed for moderate pain.     ondansetron 4 MG tablet  Commonly known as:  ZOFRAN  Take 1 tablet (4 mg total) by mouth every 8 (eight) hours as needed for nausea or vomiting.     prenatal multivitamin Tabs tablet  Take 1 tablet by mouth daily at 12 noon.        Allergies: No Known Allergies  No family history on file.  Social History:  reports that she has never smoked. She does not have any smokeless tobacco history on file. She reports that she does not drink alcohol. Her drug history is not on file.  ROS: A complete review of systems was performed.  All systems are negative except for pertinent findings as noted.  Physical Exam:  Vital signs in last 24 hours: Temp:  [97.7 F (36.5 C)-98.5 F (36.9 C)] 98 F (36.7 C) (02/01 1617) Pulse Rate:  [74-92] 74 (02/01 1617) Resp:  [16-18] 18 (02/01 1617) BP: (86-110)/(47-68) 105/52 mmHg (02/01 1617) SpO2:  [99 %-100 %] 100 % (01/31 2357) Constitutional:  Alert and oriented, No acute distress HEENT: [place] AT, moist mucus membranes.  Trachea midline, no masses Cardiovascular: Regular rate and rhythm, no clubbing, cyanosis, or edema. Respiratory: Normal respiratory effort, lungs clear bilaterally GI: Gravid uterus. Mild tenderness in the right lateral and lower abdomen to deep palpation. GU: No CVA tenderness Skin: No rashes, bruises or suspicious lesions Lymph: No cervical or inguinal  adenopathy Neurologic: Grossly intact, no focal deficits, moving all 4 extremities Psychiatric: Normal mood and affect   Laboratory Data:   Recent Labs  05/12/15 1740 05/13/15 1132  WBC 19.7* 15.4*  HGB 10.3* 8.8*  HCT 30.2* 26.4*    Recent Labs  05/12/15 1740  NA 136  K 3.6  CL 107  CO2 20*  GLUCOSE 86  BUN 10  CREATININE 0.90  CALCIUM 9.2   No results for input(s): LABPT, INR in the last 72 hours. No results for input(s): LABURIN in the last 72 hours. Results for orders placed or performed during the hospital encounter  of 05/12/15  Urine culture     Status: None (Preliminary result)   Collection Time: 05/12/15  8:28 PM  Result Value Ref Range Status   Specimen Description URINE, RANDOM  Final   Special Requests NONE  Final   Culture NO GROWTH < 12 HOURS  Final   Report Status PENDING  Incomplete   UA from yesterday reviewed, 2+ ketones, 2 numerous to count red blood cells, 0-5 white blood cells, rare bacteria, 6:30 screw was epithelials.  Radiologic Imaging: US Renal  05/13/2015  CLINICAL DATA:  Right flank pain.  Kidney stone EXAM: RENAL / URINARY TRACT ULTRASOUND COMPLETE COMPARISON:  09/12/2014 CT FINDINGS: Right Kidney: Length: 12 cm. High-grade hydronephrosis without visualized obstruction. The bladder is empty and ureteral jets could not be evaluated. Upper pole stone measuring 6 mm Left Kidney: Length: 11 cm. Echogenicity within normal limits. No mass or hydronephrosis visualized. 12 mm pelvic calculus. Bladder: Completely decompressed.  Gravid uterus IMPRESSION: 1. High-grade right hydronephrosis. The bladder is empty and presence of ureteral jets could not be established. 2. Bilateral nephrolithiasis. Electronically Signed   By: Marnee Spring M.D.   On: 05/13/2015 01:37   CLINICAL DATA: Left flank pain on off for the past month. Hematuria.  EXAM: CT ABDOMEN AND PELVIS WITHOUT CONTRAST  TECHNIQUE: Multidetector CT imaging of the abdomen and pelvis was performed following the standard protocol without IV contrast.  COMPARISON: None.  FINDINGS: Lower chest: Lung bases are clear. No effusions. Heart is normal size.  Hepatobiliary: No biliary ductal dilatation or focal hepatic lesion. Gallbladder grossly unremarkable.  Pancreas: No focal abnormality or ductal dilatation.  Spleen: No focal abnormality. Normal size.  Adrenals/Urinary Tract: 5 mm nonobstructing stone in the midpole of the right kidney. 7 mm nonobstructing stone in the midpole of the left kidney. No ureteral  stones or hydronephrosis. Urinary bladder is decompressed, grossly unremarkable.  Stomach/Bowel: Stomach, large and small bowel grossly unremarkable. Appendix is visualized and unremarkable.  Vascular/Lymphatic: No retroperitoneal or mesenteric adenopathy. Aorta normal caliber.  Reproductive: No mass or other significant abnormality.  Other: No free fluid or free air.  Musculoskeletal: No focal bone lesion or acute bony abnormality.  IMPRESSION: Bilateral nephrolithiasis. No ureteral stones or hydronephrosis.   Electronically Signed  By: Charlett Nose M.D.  On: 09/12/2014 17:03   Impression/Assessment:  25 year old female currently [redacted] weeks pregnant with severe right hydronephrosis, history of nephrolithiasis with presumed right ureteral stone, leukocytosis which is improving. No other signs or symptoms of sepsis including no fevers, chills, tachycardia or hypotension, or evidence of UTI.  Her pain is also well controlled with pain medications.  Discussed various options today including surgical intervention versus expulsive therapy/observation. Based on previous imaging, the stone is likely of a size that should be able to pass spontaneously, unless it is enlarged significantly over the past 6 months.  We discussed the risk and  benefits of each option in detail today including the risk of intervention including the extremely low but real risk of fetal loss.    She would prefer to continue medical expulsive therapy.  We discussed warning symptoms including fevers, chills, inability to tolerate anything by mouth, uncontrolled pain, or any other concerning symptoms at which time she was instructed to seek medical attention immediately as this can be a sign of a life-threatening infection and may require urgent intervention.  Plan:  #1 recommend initiation of Flomax 0.4 mg daily to help with ureteral discomfort and stone passage #2 repeat CBC tomorrow, if normalized, okay for  discharge #3 strain urine during admission and upon discharge #4 patient should follow up with [facility] after delivery to address her nonobstructing kidney stones and assess for resolution of hydronephrosis #5 please call urology urgently if clinical status changes   05/13/2015, 6:34 PM  Vanna Scotland,  MD  Thank you for involving me in this patient's care. [place] page with any further questions or concerns.

## 2015-05-13 NOTE — Progress Notes (Addendum)
PAD #1 Right flank pain   S: Has been nauseous this AM. The pain continues on the right flank, but usually the pain meds help control pain to an extent. The worse the pain gets, the more nauseous she feels. She tolerated liquids, but has not ate any solid food this AM. Feeling baby move. No diarrhea.  O: BP 97/57 mmHg  Pulse 86  Temp(Src) 98.1 F (36.7 C) (Oral)  Resp 17  Ht  (1.626 m)  Wt 65.772 kg (145 lb)  BMI 24.88 kg/m2  SpO2 100%  LMP 12/07/2014  Afebrile since admission General: alert/awake/ sitting up in bed Positive CVAT on right Results for orders placed or performed during the hospital encounter of 05/12/15 (from the past 24 hour(s))  Basic metabolic panel     Status: Abnormal   Collection Time: 05/12/15  5:40 PM  Result Value Ref Range   Sodium 136 135 - 145 mmol/L   Potassium 3.6 3.5 - 5.1 mmol/L   Chloride 107 101 - 111 mmol/L   CO2 20 (L) 22 - 32 mmol/L   Glucose, Bld 86 65 - 99 mg/dL   BUN 10 6 - 20 mg/dL   Creatinine, Ser 4.09 0.44 - 1.00 mg/dL   Calcium 9.2 8.9 - 81.1 mg/dL   GFR calc non Af Amer >60 >60 mL/min   GFR calc Af Amer >60 >60 mL/min   Anion gap 9 5 - 15  CBC     Status: Abnormal   Collection Time: 05/12/15  5:40 PM  Result Value Ref Range   WBC 19.7 (H) 3.6 - 11.0 K/uL   RBC 3.46 (L) 3.80 - 5.20 MIL/uL   Hemoglobin 10.3 (L) 12.0 - 16.0 g/dL   HCT 91.4 (L) 78.2 - 95.6 %   MCV 87.3 80.0 - 100.0 fL   MCH 29.9 26.0 - 34.0 pg   MCHC 34.3 32.0 - 36.0 g/dL   RDW 21.3 08.6 - 57.8 %   Platelets 270 150 - 440 K/uL  Urinalysis complete, with microscopic ([facility] only)     Status: Abnormal   Collection Time: 05/12/15  5:40 PM  Result Value Ref Range   Color, Urine YELLOW (A) YELLOW   APPearance CLEAR (A) CLEAR   Glucose, UA NEGATIVE NEGATIVE mg/dL   Bilirubin Urine NEGATIVE NEGATIVE   Ketones, ur 2+ (A) NEGATIVE mg/dL   Specific Gravity, Urine 1.031 (H) 1.005 - 1.030   Hgb urine dipstick 2+ (A) NEGATIVE   pH 6.0 5.0 - 8.0   Protein, ur 30 (A)  NEGATIVE mg/dL   Nitrite NEGATIVE NEGATIVE   Leukocytes, UA NEGATIVE NEGATIVE   RBC / HPF TOO NUMEROUS TO COUNT 0 - 5 RBC/hpf   WBC, UA 0-5 0 - 5 WBC/hpf   Bacteria, UA RARE (A) NONE SEEN   Squamous Epithelial / LPF 6-30 (A) NONE SEEN   Mucous PRESENT   Urine culture     Status: None (Preliminary result)   Collection Time: 05/12/15  8:28 PM  Result Value Ref Range   Specimen Description URINE, RANDOM    Special Requests NONE    Culture NO GROWTH < 12 HOURS    Report Status PENDING    Renal ultrasound reveals stones bilaterally and high grade right hydronephrosis. Utereral jets not seen/ bladder empty  A: Right flank pain most likely due to stones and right hydronephrosis Nausea persisting Elevated WBC on admission  P: repeat CBC at 1200 Consider urology consult at that time. Add phenergan if needed  Sherry Townsend,  Sherry Townsend, CNM  WBC count decreased to 15.4K Resting comfortably at this time No abdominal pain Consulted Dr Leonides Sake, urologist who will be by to see patient today Sherry Townsend, CNM

## 2015-05-13 NOTE — Discharge Summary (Signed)
Discharge Summary   Admit Date: 05/12/2015 Discharge Date: 05/14/2015 Discharging Service: Obstetrics  Primary OBGYN: [facility] Admitting Physician: Damon Bing, MD  Discharge Physician: Tresea Mall, CNM  Referring Provider: Self  Primary Care Provider: None  Admission Diagnoses: *Intrauterine pregnancy @ 22/2 *Right flank pain  Discharge Diagnoses: *Presumed Right Ureteral Stone  Consult Orders: IP CONSULT TO UROLOGY   Surgeries/Procedures Performed: Imaging per consult with Urology  History and Physical: Obstetrics Admission History & Physical  05/12/2015 - 10:30 PM Primary OBGYN: [facility]  Chief Complaint: right flank pain  History of Present Illness  25 y.o. G1 @ [redacted]w[redacted]d (Dating: EDC 6/4, dated by 7wk u/s), with the above CC. Pregnancy complicated by: h/o nephrolithiasis.  Ms. Favor Kreh states that starting last night and then worsened at 0300 today she started having right back/flank pain. She states that it's always there but can increase with sharp feelings. Prior h/o similar pains when she was first diagnosed with stones in the middle of last year. She does have some nausea and vomiting and not much of an appetite, but no fevers, chills, chest pain, SOB, decreased FM, uterine cramping, VB, LOF, dysuria or hematuria. She was seen a few weeks ago in the ER for hematuria and sent on keflex; fetal u/s done at that time was norma. No renal scan was done or a UCx but pelvic exam negative. She has had a normal anatomy scan in the office.   Review of Systems: her 12 point review of systems is negative or as noted in the History of Present Illness.  PMHx:  Past Medical History  Diagnosis Date  . Kidney stone    PSHx: No past surgical history on file. Medications: tylenol, PNV  Allergies: has No Known Allergies. OBHx:  OB History  Gravida Para Term Preterm AB SAB TAB Ectopic Multiple Living  1             # Outcome  Date GA Lbr Len/2nd Weight Sex Delivery Anes PTL Lv  1 Current               GYNHx:  History of abnormal pap smears: No. History of STIs: No..  FHx: No family history on file. Soc Hx:  Social History   Social History  . Marital Status: Single    Spouse Name: N/A  . Number of Children: N/A  . Years of Education: N/A   Occupational History  . Not on file.   Social History Main Topics  . Smoking status: Never Smoker   . Smokeless tobacco: Not on file  . Alcohol Use: No  . Drug Use: Not on file  . Sexual Activity: Not on file   Other Topics Concern  . Not on file   Social History Narrative    Objective    Current Vital Signs 24h Vital Sign Ranges  T 98 F (36.7 C) Temp Avg: 97.8 F (36.6 C) Min: 97.5 F (36.4 C) Max: 98 F (36.7 C)  BP 110/68 mmHg BP Min: 110/68 Max: 118/64  HR 86 Pulse Avg: 95 Min: 86 Max: 104  RR 16 Resp Avg: 17 Min: 16 Max: 18  SaO2 99 % Not Delivered SpO2 Avg: 99.7 % Min: 99 % Max: 100 %       24 Hour I/O Current Shift I/O  Time Ins Outs       EFM: normal fetal dopplers   General: Well nourished, well developed female in no acute distress.  Skin: Warm and dry.  Cardiovascular: Regular  rate and rhythm. Respiratory: Clear to auscultation bilateral. Normal respiratory effort Abdomen: gravid, nttp Back: slightly tender to palpation in low right back and ?for CVAT Neuro/Psych: Normal mood and affect.   Labs   Last Labs      Recent Labs Lab 05/12/15 1740  NA 136  K 3.6  CL 107  CO2 20*  BUN 10  CREATININE 0.90  GLUCOSE 86  CALCIUM 9.2      Last Labs      Recent Labs Lab 05/12/15 1740  WBC 19.7*  HGB 10.3*  HCT 30.2*  PLT 270     CBC Latest Ref Rng 05/14/2015 05/13/2015 05/12/2015  WBC 3.6 - 11.0 K/uL 12.0(H) 15.4(H) 19.7(H)  Hemoglobin 12.0 - 16.0 g/dL 8.2(L)  8.8(L) 10.3(L)  Hematocrit 35.0 - 47.0 % 24.3(L) 26.4(L) 30.2(L)  Platelets 150 - 440 K/uL 207 221 270     Results for ALYSABETH, SCALIA (MRN [id number]) as of 05/12/2015 22:32  Ref. Range 05/12/2015 17:40  Appearance Latest Ref Range: CLEAR  CLEAR (A)  Bacteria, UA Latest Ref Range: NONE SEEN  RARE (A)  Bilirubin Urine Latest Ref Range: NEGATIVE  NEGATIVE  Color, Urine Latest Ref Range: YELLOW  YELLOW (A)  Glucose Latest Ref Range: NEGATIVE mg/dL NEGATIVE  Hgb urine dipstick Latest Ref Range: NEGATIVE  2+ (A)  Ketones, ur Latest Ref Range: NEGATIVE mg/dL 2+ (A)  Leukocytes, UA Latest Ref Range: NEGATIVE  NEGATIVE  Mucous Unknown PRESENT  Nitrite Latest Ref Range: NEGATIVE  NEGATIVE  pH Latest Ref Range: 5.0-8.0  6.0  Protein Latest Ref Range: NEGATIVE mg/dL 30 (A)  RBC / HPF Latest Ref Range: 0-5 RBC/hpf TOO NUMEROUS TO C...  Specific Gravity, Urine Latest Ref Range: 1.005-1.030  1.031 (H)  Squamous Epithelial / LPF Latest Ref Range: NONE SEEN  6-30 (A)  WBC, UA Latest Ref Range: 0-5 WBC/hpf 0-5   Radiology none  Assessment & Plan  25 y.o. G1 @ [redacted]w[redacted]d with possible pyelo vs stone vs GI process. Pt currently stable. Will admit to AP service *IUP: fetal status reassuring. qday PNV. Daily FHTs *Right flank pain: more likely renal process with stone and/or pyelo/ID etiology. Cr and electrolytes normal -Will treat with rocephin 1gm IV x 1 -1.5L NS bolus and then 13mL/h MIVF -Renal u/s ordered -UCx ordered -possibility of appy or GI etiology but low. Will continue to watch for s/s -Rpt CBC ordered for noon tomorrow *Analgesia: IV for breakthrough. PO PRNs with apap and oxycodone ordered *FEN/GI: regular diet. IVFs. anti-emetics ordered *PPx: OOB ad lib. *Dispo: Home today     Hospital Course: *IUP: fetal status reassuring. qday prenatal vitamins. Daily FHTs *Right flank pain: Differential diagnosis more likely renal  process with stone and/or pyelo/ID etiology; possibility of GI etiology such as appendicitis considered but considered low. Admission Cr and electrolytes normal with WBC 19.7. Patient treated with rocephin 1gm IV x 1 and IVF bolus and MIVF. Renal u/s showed: Right Kidney: Length: 12 cm. High-grade hydronephrosis without visualized obstruction. The bladder is empty and ureteral jets could not be evaluated. Upper pole stone measuring 6 mm Left Kidney: Length: 11 cm. Echogenicity within normal limits. No mass or hydronephrosis visualized. 12 mm pelvic calculus. Bladder: Completely decompressed. Gravid uterus IMPRESSION: 1. High-grade right hydronephrosis. The bladder is empty and presence of ureteral jets could not be established. 2. Bilateral nephrolithiasis.  *Analgesia: IV for breakthrough. PO PRNs with apap and oxycodone ordered *FEN/GI: regular diet. IVFs. anti-emetics ordered *PPx: OOB ad lib.  Discharge Exam:  ROS normal. General: alert and oriented and in no distress. Pain level is controlled. Taking PO and voiding without difficulty. Ambulating without difficulty. + Fetal Movement. No LOF, VB. No contractions.   Discharge Disposition:  Home  Discharge Medications:   Medication List    STOP taking these medications        acetaminophen 500 MG tablet  Commonly known as:  TYLENOL     cephALEXin 500 MG capsule  Commonly known as:  KEFLEX     HYDROcodone-acetaminophen 5-325 MG tablet  Commonly known as:  NORCO     ondansetron 4 MG tablet  Commonly known as:  ZOFRAN      TAKE these medications        oxyCODONE-acetaminophen 5-325 MG tablet  Commonly known as:  ROXICET  Take 1-2 tablets by mouth every 6 (six) hours as needed for severe pain.     prenatal multivitamin Tabs tablet  Take 1 tablet by mouth daily at 12 noon.     tamsulosin 0.4 MG Caps capsule  Commonly known as:  FLOMAX  Take 1 capsule (0.4 mg total) by mouth daily.       Follow up in 1-2 weeks with  [facility]  [facility], CNM   This patient and plan were discussed with Dr Elesa Massed 05/14/2015

## 2015-05-14 DIAGNOSIS — O26839 Pregnancy related renal disease, unspecified trimester: Secondary | ICD-10-CM

## 2015-05-14 DIAGNOSIS — N200 Calculus of kidney: Secondary | ICD-10-CM

## 2015-05-14 HISTORY — DX: Calculus of kidney: O26.839

## 2015-05-14 HISTORY — DX: Calculus of kidney: N20.0

## 2015-05-14 LAB — URINE CULTURE: Culture: 3000

## 2015-05-14 LAB — CBC
HCT: 24.3 % — ABNORMAL LOW (ref 35.0–47.0)
HEMOGLOBIN: 8.2 g/dL — AB (ref 12.0–16.0)
MCH: 29.4 pg (ref 26.0–34.0)
MCHC: 33.5 g/dL (ref 32.0–36.0)
MCV: 87.8 fL (ref 80.0–100.0)
Platelets: 207 10*3/uL (ref 150–440)
RBC: 2.77 MIL/uL — AB (ref 3.80–5.20)
RDW: 13 % (ref 11.5–14.5)
WBC: 12 10*3/uL — ABNORMAL HIGH (ref 3.6–11.0)

## 2015-05-14 MED ORDER — OXYCODONE-ACETAMINOPHEN 5-325 MG PO TABS: 1.0000 | ORAL_TABLET | Freq: Four times a day (QID) | ORAL | Status: DC | PRN

## 2015-05-14 MED ORDER — TAMSULOSIN HCL 0.4 MG PO CAPS: 0.4000 mg | ORAL_CAPSULE | Freq: Every day | ORAL | Status: DC

## 2015-05-14 NOTE — Progress Notes (Signed)
Discharge instr reviewed with pt and husband.  Verb u/o.  Rx given for home use.  Pt wanted to make her own appt with [facility];  Address and phone number provided.  Inst. To keep regular OB appt,.

## 2015-05-19 ENCOUNTER — Ambulatory Visit (INDEPENDENT_AMBULATORY_CARE_PROVIDER_SITE_OTHER): Payer: Managed Care, Other (non HMO) | Admitting: Urology

## 2015-05-19 ENCOUNTER — Encounter: Payer: Self-pay | Admitting: Urology

## 2015-05-19 VITALS — BP 108/69 | HR 101 | Ht 64.0 in | Wt 148.0 lb

## 2015-05-19 DIAGNOSIS — O26839 Pregnancy related renal disease, unspecified trimester: Secondary | ICD-10-CM | POA: Diagnosis not present

## 2015-05-19 DIAGNOSIS — N201 Calculus of ureter: Secondary | ICD-10-CM

## 2015-05-19 DIAGNOSIS — N200 Calculus of kidney: Secondary | ICD-10-CM

## 2015-05-19 LAB — URINALYSIS, COMPLETE
Bilirubin, UA: NEGATIVE
GLUCOSE, UA: NEGATIVE
Ketones, UA: NEGATIVE
LEUKOCYTES UA: NEGATIVE
Nitrite, UA: NEGATIVE
PH UA: 7 (ref 5.0–7.5)
PROTEIN UA: NEGATIVE
RBC, UA: NEGATIVE
Specific Gravity, UA: <1.005 — ABNORMAL LOW (ref 1.005–1.030)
Urobilinogen, Ur: 0.2 mg/dL (ref 0.2–1.0)

## 2015-05-19 LAB — MICROSCOPIC EXAMINATION
BACTERIA UA: NONE SEEN
RBC, UA: NONE SEEN /hpf (ref 0–?)

## 2015-05-19 NOTE — Progress Notes (Signed)
05/19/2015 10:49 AM   Sherry Townsend 28-Jul-1990 [id number]  Referring provider: Nadara Mustard, MD [address] [place], [place] [address]  Chief Complaint  Patient presents with  . New Patient (Initial Visit)    renal stones    HPI: Sherry Townsend is a 24yo seen in followup for nephrolithiasis.  She is [redacted] weeks pregnant. She was seen in the hospital for nephrolithiasis and right hydronephrosis. Her last pain episode  Was 5 days ago. She was not given a strainer at discharge. No gross hematuria. She has mild frequency and nocturia   PMH: Past Medical History  Diagnosis Date  . Kidney stone   . Kidney stone complicating pregnancy 05/14/2015    Surgical History: Past Surgical History  Procedure Laterality Date  . Wisdom tooth extraction      Home Medications:    Medication List       This list is accurate as of: 05/19/15 10:49 AM.  Always use your most recent med list.               oxyCODONE-acetaminophen 5-325 MG tablet  Commonly known as:  ROXICET  Take 1-2 tablets by mouth every 6 (six) hours as needed for severe pain.     prenatal multivitamin Tabs tablet  Take 1 tablet by mouth daily at 12 noon.     tamsulosin 0.4 MG Caps capsule  Commonly known as:  FLOMAX  Take 1 capsule (0.4 mg total) by mouth daily.        Allergies: No Known Allergies  Family History: Family History  Problem Relation Age of Onset  . Nephrolithiasis Maternal Grandfather   . Nephrolithiasis Maternal Aunt     Social History:  reports that she has never smoked. She does not have any smokeless tobacco history on file. She reports that she does not drink alcohol or use illicit drugs.  ROS: UROLOGY Frequent Urination?: Yes Hard to postpone urination?: No Burning/pain with urination?: No Get up at night to urinate?: Yes Leakage of urine?: No Urine stream starts and stops?: No Trouble starting stream?: No Do you have to strain to urinate?: No Blood in urine?:  No Urinary tract infection?: No Sexually transmitted disease?: No Injury to kidneys or bladder?: No Painful intercourse?: No Weak stream?: No Currently pregnant?: Yes Vaginal bleeding?: No Last menstrual period?: Pregnant  Gastrointestinal Nausea?: No Vomiting?: No Indigestion/heartburn?: No Diarrhea?: No Constipation?: No  Constitutional Fever: No Night sweats?: No Weight loss?: No Fatigue?: No  Skin Skin rash/lesions?: No Itching?: No  Eyes Blurred vision?: No Double vision?: No  Ears/Nose/Throat Sore throat?: No Sinus problems?: No  Hematologic/Lymphatic Swollen glands?: No Easy bruising?: No  Cardiovascular Leg swelling?: No Chest pain?: No  Respiratory Cough?: No Shortness of breath?: No  Endocrine Excessive thirst?: No  Musculoskeletal Back pain?: No Joint pain?: No  Neurological Headaches?: No Dizziness?: No  Psychologic Depression?: No  Physical Exam: BP 108/69 mmHg  Pulse 101  Ht  (1.626 m)  Wt 67.132 kg (148 lb)  BMI 25.39 kg/m2  LMP 12/07/2014  Constitutional:  Alert and oriented, No acute distress. HEENT: [place] AT, moist mucus membranes.  Trachea midline, no masses. Cardiovascular: No clubbing, cyanosis, or edema. Respiratory: Normal respiratory effort, no increased work of breathing. GI: Abdomen is soft, nontender, nondistended, no abdominal masses GU: No CVA tenderness.  Skin: No rashes, bruises or suspicious lesions. Lymph: No cervical or inguinal adenopathy. Neurologic: Grossly intact, no focal deficits, moving all 4 extremities. Psychiatric: Normal mood and affect.  Laboratory Data: Lab Results  Component Value Date   WBC 12.0* 05/14/2015   HGB 8.2* 05/14/2015   HCT 24.3* 05/14/2015   MCV 87.8 05/14/2015   PLT 207 05/14/2015    Lab Results  Component Value Date   CREATININE 0.90 05/12/2015    No results found for: PSA  No results found for: TESTOSTERONE  No results found for: HGBA1C  Urinalysis     Component Value Date/Time   COLORURINE YELLOW* 05/12/2015 1740   APPEARANCEUR CLEAR* 05/12/2015 1740   LABSPEC 1.031* 05/12/2015 1740   PHURINE 6.0 05/12/2015 1740   GLUCOSEU NEGATIVE 05/12/2015 1740   HGBUR 2+* 05/12/2015 1740   BILIRUBINUR NEGATIVE 05/12/2015 1740   KETONESUR 2+* 05/12/2015 1740   PROTEINUR 30* 05/12/2015 1740   NITRITE NEGATIVE 05/12/2015 1740   LEUKOCYTESUR NEGATIVE 05/12/2015 1740    Pertinent Imaging: Renal US  Assessment & Plan:    1. Kidney stone complicating pregnancy, unspecified trimester -repeat renal US - Urinalysis, Complete   No Follow-up on file.  Malen Gauze, MD  [facility] [address] [place], [place] [address] [phone]

## 2015-05-28 ENCOUNTER — Ambulatory Visit
Admission: RE | Admit: 2015-05-28 | Discharge: 2015-05-28 | Disposition: A | Discharge status: Home / Self Care | Payer: Managed Care, Other (non HMO) | Source: Ambulatory Visit | Attending: Urology | Admitting: Urology

## 2015-05-28 DIAGNOSIS — O26839 Pregnancy related renal disease, unspecified trimester: Secondary | ICD-10-CM | POA: Insufficient documentation

## 2015-05-28 DIAGNOSIS — N200 Calculus of kidney: Secondary | ICD-10-CM | POA: Diagnosis present

## 2015-05-28 DIAGNOSIS — N201 Calculus of ureter: Secondary | ICD-10-CM | POA: Insufficient documentation

## 2015-06-02 ENCOUNTER — Ambulatory Visit (INDEPENDENT_AMBULATORY_CARE_PROVIDER_SITE_OTHER): Payer: Managed Care, Other (non HMO) | Admitting: Urology

## 2015-06-02 ENCOUNTER — Encounter: Payer: Self-pay | Admitting: Urology

## 2015-06-02 VITALS — BP 101/65 | HR 84 | Temp 97.5°F | Resp 16 | Wt 149.0 lb

## 2015-06-02 DIAGNOSIS — N200 Calculus of kidney: Secondary | ICD-10-CM

## 2015-06-02 NOTE — Progress Notes (Signed)
06/02/2015 9:45 AM   Sherry Townsend 02-03-1991 [id number]  Referring provider: Nadara Mustard, MD [address] [place], [place] [address]  Chief Complaint  Patient presents with  . Discuss [place] results    HPI: Ms Sherry Townsend is a 25yo here for followup for renal calculi.  She denies any flank pain. She underwent renal US which showed 1.6cm left renal pelvis calculus and a 4mm right mid pole calculus. She denies worsening LUTS.   PMH: Past Medical History  Diagnosis Date  . Kidney stone   . Kidney stone complicating pregnancy 05/14/2015    Surgical History: Past Surgical History  Procedure Laterality Date  . Wisdom tooth extraction      Home Medications:    Medication List       This list is accurate as of: 06/02/15  9:45 AM.  Always use your most recent med list.               oxyCODONE-acetaminophen 5-325 MG tablet  Commonly known as:  ROXICET  Take 1-2 tablets by mouth every 6 (six) hours as needed for severe pain.     prenatal multivitamin Tabs tablet  Take 1 tablet by mouth daily at 12 noon.     tamsulosin 0.4 MG Caps capsule  Commonly known as:  FLOMAX  Take 1 capsule (0.4 mg total) by mouth daily.        Allergies: No Known Allergies  Family History: Family History  Problem Relation Age of Onset  . Nephrolithiasis Maternal Grandfather   . Nephrolithiasis Maternal Aunt     Social History:  reports that she has never smoked. She does not have any smokeless tobacco history on file. She reports that she does not drink alcohol or use illicit drugs.  ROS: UROLOGY Frequent Urination?: No Hard to postpone urination?: No Burning/pain with urination?: No Get up at night to urinate?: No Leakage of urine?: No Urine stream starts and stops?: No Trouble starting stream?: No Do you have to strain to urinate?: No Blood in urine?: No Urinary tract infection?: No Sexually transmitted disease?: No Injury to kidneys or bladder?: No Painful  intercourse?: No Weak stream?: No Currently pregnant?: Yes Vaginal bleeding?: No Last menstrual period?: Pregnant  Gastrointestinal Nausea?: No Vomiting?: No Indigestion/heartburn?: No Diarrhea?: No Constipation?: No  Constitutional Fever: No Night sweats?: No Weight loss?: No Fatigue?: No  Skin Skin rash/lesions?: No Itching?: No  Eyes Blurred vision?: No Double vision?: No  Ears/Nose/Throat Sore throat?: No Sinus problems?: No  Hematologic/Lymphatic Swollen glands?: No Easy bruising?: No  Cardiovascular Leg swelling?: No Chest pain?: No  Respiratory Cough?: No Shortness of breath?: No  Endocrine Excessive thirst?: No  Musculoskeletal Back pain?: No Joint pain?: No  Neurological Headaches?: No Dizziness?: No  Psychologic Depression?: No Anxiety?: No  Physical Exam: BP 101/65 mmHg  Pulse 84  Temp(Src) 97.5 F (36.4 C)  Resp 16  Wt 67.586 kg (149 lb)  LMP 12/07/2014  Constitutional:  Alert and oriented, No acute distress. HEENT: [place] AT, moist mucus membranes.  Trachea midline, no masses. Cardiovascular: No clubbing, cyanosis, or edema. Respiratory: Normal respiratory effort, no increased work of breathing. GI: Abdomen is soft, nontender, nondistended, no abdominal masses GU: No CVA tenderness.  Skin: No rashes, bruises or suspicious lesions. Lymph: No cervical or inguinal adenopathy. Neurologic: Grossly intact, no focal deficits, moving all 4 extremities. Psychiatric: Normal mood and affect.  Laboratory Data: Lab Results  Component Value Date   WBC 12.0* 05/14/2015   HGB 8.2* 05/14/2015  HCT 24.3* 05/14/2015   MCV 87.8 05/14/2015   PLT 207 05/14/2015    Lab Results  Component Value Date   CREATININE 0.90 05/12/2015    No results found for: PSA  No results found for: TESTOSTERONE  No results found for: HGBA1C  Urinalysis    Component Value Date/Time   COLORURINE YELLOW* 05/12/2015 1740   APPEARANCEUR CLEAR* 05/12/2015  1740   LABSPEC 1.031* 05/12/2015 1740   PHURINE 6.0 05/12/2015 1740   GLUCOSEU Negative 05/19/2015 1028   HGBUR 2+* 05/12/2015 1740   BILIRUBINUR Negative 05/19/2015 1028   BILIRUBINUR NEGATIVE 05/12/2015 1740   KETONESUR 2+* 05/12/2015 1740   PROTEINUR 30* 05/12/2015 1740   NITRITE Negative 05/19/2015 1028   NITRITE NEGATIVE 05/12/2015 1740   LEUKOCYTESUR Negative 05/19/2015 1028   LEUKOCYTESUR NEGATIVE 05/12/2015 1740    Pertinent Imaging: renal US  Assessment & Plan:   1. Bilateral renal calculi -continue observation -RTC 1 month  There are no diagnoses linked to this encounter.  No Follow-up on file.  Malen Gauze, MD  [facility] [address], [address] [place], [place] [address] [phone]

## 2015-06-05 DIAGNOSIS — N200 Calculus of kidney: Secondary | ICD-10-CM | POA: Insufficient documentation

## 2015-06-23 ENCOUNTER — Encounter: Payer: Self-pay | Admitting: Urology

## 2015-06-23 ENCOUNTER — Ambulatory Visit (INDEPENDENT_AMBULATORY_CARE_PROVIDER_SITE_OTHER): Payer: Managed Care, Other (non HMO) | Admitting: Urology

## 2015-06-23 VITALS — BP 108/72 | HR 91 | Ht 64.0 in | Wt 153.0 lb

## 2015-06-23 DIAGNOSIS — N200 Calculus of kidney: Secondary | ICD-10-CM | POA: Diagnosis not present

## 2015-06-23 LAB — URINALYSIS, COMPLETE
BILIRUBIN UA: NEGATIVE
Glucose, UA: NEGATIVE
Ketones, UA: NEGATIVE
LEUKOCYTES UA: NEGATIVE
Nitrite, UA: NEGATIVE
PH UA: 7 (ref 5.0–7.5)
PROTEIN UA: NEGATIVE
Specific Gravity, UA: 1.015 (ref 1.005–1.030)
Urobilinogen, Ur: 0.2 mg/dL (ref 0.2–1.0)

## 2015-06-23 LAB — MICROSCOPIC EXAMINATION
BACTERIA UA: NONE SEEN
RBC, UA: >30 /hpf — AB (ref 0–?)

## 2015-06-23 NOTE — Progress Notes (Signed)
06/23/2015 9:50 AM   [name] 1990-12-02 [id number]  Referring provider: Nadara Mustard, MD [address] [place], [place] [address]  Chief Complaint  Patient presents with  . Nephrolithiasis    HPI: Sherry Townsend is a 24yo seen in followup for bilateral nephrolithiasis. Her renal US 1 months ago showed a left 1.6cm renal pelvis calculi and a right 5-71mm mid pole calculus . No pain. No stone events since last visit   PMH: Past Medical History  Diagnosis Date  . Kidney stone   . Kidney stone complicating pregnancy 05/14/2015    Surgical History: Past Surgical History  Procedure Laterality Date  . Wisdom tooth extraction      Home Medications:    Medication List       This list is accurate as of: 06/23/15  9:50 AM.  Always use your most recent med list.               oxyCODONE-acetaminophen 5-325 MG tablet  Commonly known as:  ROXICET  Take 1-2 tablets by mouth every 6 (six) hours as needed for severe pain.     prenatal multivitamin Tabs tablet  Take 1 tablet by mouth daily at 12 noon.        Allergies: No Known Allergies  Family History: Family History  Problem Relation Age of Onset  . Nephrolithiasis Maternal Grandfather   . Nephrolithiasis Maternal Aunt     Social History:  reports that she has never smoked. She does not have any smokeless tobacco history on file. She reports that she does not drink alcohol or use illicit drugs.  ROS: UROLOGY Frequent Urination?: No Hard to postpone urination?: No Burning/pain with urination?: No Get up at night to urinate?: No Leakage of urine?: No Urine stream starts and stops?: No Trouble starting stream?: No Do you have to strain to urinate?: No Blood in urine?: No Urinary tract infection?: No Sexually transmitted disease?: No Injury to kidneys or bladder?: No Painful intercourse?: No Weak stream?: No Currently pregnant?: Yes Vaginal bleeding?: No Last menstrual period?: pregnant  [redacted]wks  Gastrointestinal Nausea?: No Vomiting?: No Indigestion/heartburn?: No Diarrhea?: No Constipation?: No  Constitutional Fever: No Night sweats?: No Weight loss?: No Fatigue?: No  Skin Skin rash/lesions?: No Itching?: No  Eyes Blurred vision?: No Double vision?: No  Ears/Nose/Throat Sore throat?: No Sinus problems?: No  Hematologic/Lymphatic Swollen glands?: No Easy bruising?: No  Cardiovascular Leg swelling?: No Chest pain?: No  Respiratory Cough?: No Shortness of breath?: No  Endocrine Excessive thirst?: No  Musculoskeletal Back pain?: No Joint pain?: No  Neurological Headaches?: No Dizziness?: No  Psychologic Depression?: No Anxiety?: No  Physical Exam: BP 108/72 mmHg  Pulse 91  Ht  (1.626 m)  Wt 69.4 kg (153 lb)  BMI 26.25 kg/m2  LMP 12/07/2014  Constitutional:  Alert and oriented, No acute distress. HEENT: [place] AT, moist mucus membranes.  Trachea midline, no masses. Cardiovascular: No clubbing, cyanosis, or edema. Respiratory: Normal respiratory effort, no increased work of breathing. GI: Abdomen is soft, nontender, nondistended, no abdominal masses GU: No CVA tenderness.  Skin: No rashes, bruises or suspicious lesions. Lymph: No cervical or inguinal adenopathy. Neurologic: Grossly intact, no focal deficits, moving all 4 extremities. Psychiatric: Normal mood and affect.  Laboratory Data: Lab Results  Component Value Date   WBC 12.0* 05/14/2015   HGB 8.2* 05/14/2015   HCT 24.3* 05/14/2015   MCV 87.8 05/14/2015   PLT 207 05/14/2015    Lab Results  Component Value Date  CREATININE 0.90 05/12/2015    No results found for: PSA  No results found for: TESTOSTERONE  No results found for: HGBA1C  Urinalysis    Component Value Date/Time   COLORURINE YELLOW* 05/12/2015 1740   APPEARANCEUR CLEAR* 05/12/2015 1740   LABSPEC 1.031* 05/12/2015 1740   PHURINE 6.0 05/12/2015 1740   GLUCOSEU Negative 05/19/2015 1028    HGBUR 2+* 05/12/2015 1740   BILIRUBINUR Negative 05/19/2015 1028   BILIRUBINUR NEGATIVE 05/12/2015 1740   KETONESUR 2+* 05/12/2015 1740   PROTEINUR 30* 05/12/2015 1740   NITRITE Negative 05/19/2015 1028   NITRITE NEGATIVE 05/12/2015 1740   LEUKOCYTESUR Negative 05/19/2015 1028   LEUKOCYTESUR NEGATIVE 05/12/2015 1740    Pertinent Imaging: Renal US  Assessment & Plan:    1. Nephrolithiasis -renal US in 1 month -RTC 1 month - Urinalysis, Complete   No Follow-up on file.  Malen GauzeMCKENZIE, Meilin Brosh L, MD  [facility] [address], [address] [place], [place] [address] [phone]

## 2015-07-01 ENCOUNTER — Ambulatory Visit
Admission: RE | Admit: 2015-07-01 | Discharge: 2015-07-01 | Disposition: A | Discharge status: Home / Self Care | Payer: Managed Care, Other (non HMO) | Source: Ambulatory Visit | Attending: Urology | Admitting: Urology

## 2015-07-01 DIAGNOSIS — N200 Calculus of kidney: Secondary | ICD-10-CM | POA: Diagnosis present

## 2015-07-01 DIAGNOSIS — N1330 Unspecified hydronephrosis: Secondary | ICD-10-CM | POA: Diagnosis not present

## 2015-07-21 ENCOUNTER — Ambulatory Visit (INDEPENDENT_AMBULATORY_CARE_PROVIDER_SITE_OTHER): Payer: Managed Care, Other (non HMO) | Admitting: Urology

## 2015-07-21 VITALS — BP 118/72 | HR 112 | Wt 159.0 lb

## 2015-07-21 DIAGNOSIS — N200 Calculus of kidney: Secondary | ICD-10-CM

## 2015-07-21 LAB — MICROSCOPIC EXAMINATION
Bacteria, UA: NONE SEEN
Epithelial Cells (non renal): >10 /hpf — AB (ref 0–10)

## 2015-07-21 LAB — URINALYSIS, COMPLETE
Bilirubin, UA: NEGATIVE
GLUCOSE, UA: NEGATIVE
KETONES UA: NEGATIVE
NITRITE UA: NEGATIVE
Protein, UA: NEGATIVE
SPEC GRAV UA: 1.015 (ref 1.005–1.030)
Urobilinogen, Ur: 0.2 mg/dL (ref 0.2–1.0)
pH, UA: 7 (ref 5.0–7.5)

## 2015-07-21 NOTE — Progress Notes (Signed)
07/21/2015 11:18 AM   Sherry Townsend 03/03/1991 [id number]  Referring provider: Nadara Mustardobert P Harris, MD [address] [place], [place] [address]  Chief Complaint  Patient presents with  . Results    26month [redacted]wks pregnant    HPI: Ms Barry DienesOwens is a 25yo who is [redacted] weeks pregnant with large bilateral renal calculi. She denies any pain.   No hematuria. No LUTS.    PMH: Past Medical History  Diagnosis Date  . Kidney stone   . Kidney stone complicating pregnancy 05/14/2015    Surgical History: Past Surgical History  Procedure Laterality Date  . Wisdom tooth extraction      Home Medications:    Medication List       This list is accurate as of: 07/21/15 11:18 AM.  Always use your most recent med list.               ferrous sulfate 325 (65 FE) MG tablet  Take 325 mg by mouth daily with breakfast.     oxyCODONE-acetaminophen 5-325 MG tablet  Commonly known as:  ROXICET  Take 1-2 tablets by mouth every 6 (six) hours as needed for severe pain.     prenatal multivitamin Tabs tablet  Take 1 tablet by mouth daily at 12 noon.        Allergies: No Known Allergies  Family History: Family History  Problem Relation Age of Onset  . Nephrolithiasis Maternal Grandfather   . Nephrolithiasis Maternal Aunt     Social History:  reports that she has never smoked. She does not have any smokeless tobacco history on file. She reports that she does not drink alcohol or use illicit drugs.  ROS: UROLOGY Frequent Urination?: No Hard to postpone urination?: No Burning/pain with urination?: No Get up at night to urinate?: No Leakage of urine?: No Urine stream starts and stops?: No Trouble starting stream?: No Do you have to strain to urinate?: No Blood in urine?: No Urinary tract infection?: No Sexually transmitted disease?: No Injury to kidneys or bladder?: No Painful intercourse?: No Weak stream?: No Currently pregnant?: Yes Vaginal bleeding?: No Last menstrual period?:  Pregnant  Gastrointestinal Nausea?: No Vomiting?: No Indigestion/heartburn?: No Diarrhea?: No Constipation?: No  Constitutional Fever: No Night sweats?: No Weight loss?: No Fatigue?: No  Skin Skin rash/lesions?: No Itching?: No  Eyes Blurred vision?: No Double vision?: No  Ears/Nose/Throat Sore throat?: No Sinus problems?: No  Hematologic/Lymphatic Swollen glands?: No Easy bruising?: No  Cardiovascular Leg swelling?: No Chest pain?: No  Respiratory Cough?: No Shortness of breath?: No  Endocrine Excessive thirst?: No  Musculoskeletal Back pain?: No Joint pain?: No  Neurological Headaches?: No Dizziness?: No  Psychologic Depression?: No Anxiety?: No  Physical Exam: BP 118/72 mmHg  Pulse 112  Wt 72.122 kg (159 lb)  LMP 12/07/2014  Constitutional:  Alert and oriented, No acute distress. HEENT: [place] AT, moist mucus membranes.  Trachea midline, no masses. Cardiovascular: No clubbing, cyanosis, or edema. Respiratory: Normal respiratory effort, no increased work of breathing. GI: Abdomen is soft, nontender, nondistended, no abdominal masses GU: No CVA tenderness.  Skin: No rashes, bruises or suspicious lesions. Lymph: No cervical or inguinal adenopathy. Neurologic: Grossly intact, no focal deficits, moving all 4 extremities. Psychiatric: Normal mood and affect.  Laboratory Data: Lab Results  Component Value Date   WBC 12.0* 05/14/2015   HGB 8.2* 05/14/2015   HCT 24.3* 05/14/2015   MCV 87.8 05/14/2015   PLT 207 05/14/2015    Lab Results  Component Value Date  CREATININE 0.90 05/12/2015    No results found for: PSA  No results found for: TESTOSTERONE  No results found for: HGBA1C  Urinalysis    Component Value Date/Time   COLORURINE YELLOW* 05/12/2015 1740   APPEARANCEUR Hazy* 06/23/2015 0915   APPEARANCEUR CLEAR* 05/12/2015 1740   LABSPEC 1.031* 05/12/2015 1740   PHURINE 6.0 05/12/2015 1740   GLUCOSEU Negative 06/23/2015 0915    HGBUR 2+* 05/12/2015 1740   BILIRUBINUR Negative 06/23/2015 0915   BILIRUBINUR NEGATIVE 05/12/2015 1740   KETONESUR 2+* 05/12/2015 1740   PROTEINUR Negative 06/23/2015 0915   PROTEINUR 30* 05/12/2015 1740   NITRITE Negative 06/23/2015 0915   NITRITE NEGATIVE 05/12/2015 1740   LEUKOCYTESUR Negative 06/23/2015 0915   LEUKOCYTESUR NEGATIVE 05/12/2015 1740    Pertinent Imaging:   Assessment & Plan:    1. Nephrolithiasis -renal US 4 weeks - Urinalysis, Complete   No Follow-up on file.  Wilkie Aye, MD  [facility] [address] [place], [place] [address] [phone]

## 2015-08-24 ENCOUNTER — Ambulatory Visit
Admission: RE | Admit: 2015-08-24 | Discharge: 2015-08-24 | Disposition: A | Discharge status: Home / Self Care | Payer: Managed Care, Other (non HMO) | Source: Ambulatory Visit | Attending: Urology | Admitting: Urology

## 2015-08-24 DIAGNOSIS — N1339 Other hydronephrosis: Secondary | ICD-10-CM | POA: Diagnosis not present

## 2015-08-24 DIAGNOSIS — N200 Calculus of kidney: Secondary | ICD-10-CM | POA: Insufficient documentation

## 2015-08-31 ENCOUNTER — Ambulatory Visit (INDEPENDENT_AMBULATORY_CARE_PROVIDER_SITE_OTHER): Payer: Managed Care, Other (non HMO) | Admitting: Urology

## 2015-08-31 ENCOUNTER — Encounter: Payer: Self-pay | Admitting: Urology

## 2015-08-31 VITALS — BP 119/72 | HR 75 | Ht 64.0 in | Wt 168.0 lb

## 2015-08-31 DIAGNOSIS — N200 Calculus of kidney: Secondary | ICD-10-CM | POA: Diagnosis not present

## 2015-08-31 NOTE — Progress Notes (Signed)
08/31/2015 3:58 PM   Sherry Townsend 06/19/1990 [id number]  Referring provider: Nadara Mustardobert P Harris, MD [address] [place], [place] [address]  Chief Complaint  Patient presents with  . Nephrolithiasis    73month  . Results    HPI:  1 - Nephrolithiasis - First episode urolithiasis diagnosed 05/2015 during 2nd trimester  With Rt ureteral and bilateral renal stones. 08/2015 US (at 38wk preg) - minimal hydro / physiologic and Rt 7mm mid, 13mm Lt lower stones.  Today "Sherry Townsend" is seen in f/u above. No interval colic. Expecting first daughter anytime. Planning on delivery [facility] with [facility]. 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    PMH: Past Medical History  Diagnosis Date  . Kidney stone   . Kidney stone complicating pregnancy 05/14/2015    Surgical History: Past Surgical History  Procedure Laterality Date  . Wisdom tooth extraction      Home Medications:    Medication List       This list is accurate as of: 08/31/15  3:58 PM.  Always use your most recent med list.               ferrous sulfate 325 (65 FE) MG tablet  Take 325 mg by mouth daily with breakfast.     oxyCODONE-acetaminophen 5-325 MG tablet  Commonly known as:  ROXICET  Take 1-2 tablets by mouth every 6 (six) hours as needed for severe pain.     prenatal multivitamin Tabs tablet  Take 1 tablet by mouth daily at 12 noon.        Allergies: No Known Allergies  Family History: Family History  Problem Relation Age of Onset  . Nephrolithiasis Maternal Grandfather   . Nephrolithiasis Maternal Aunt     Social History:  reports that she has never smoked. She does not have any smokeless tobacco history on file. She reports that she does not drink alcohol or use illicit drugs.  ROS: UROLOGY Frequent Urination?: No Hard to postpone urination?: No Burning/pain with urination?: No Get up at night to urinate?: No Leakage of urine?: No Urine stream starts and stops?: No Trouble  starting stream?: No Do you have to strain to urinate?: No Blood in urine?: No Urinary tract infection?: No Sexually transmitted disease?: No Injury to kidneys or bladder?: No Painful intercourse?: No Weak stream?: No Currently pregnant?: No Vaginal bleeding?: No Last menstrual period?: Pregnant [redacted]wks  Gastrointestinal Nausea?: No Vomiting?: No Indigestion/heartburn?: No Diarrhea?: No Constipation?: No  Constitutional Fever: No Night sweats?: No Weight loss?: No Fatigue?: No  Skin Skin rash/lesions?: No Itching?: No  Eyes Blurred vision?: No Double vision?: No  Ears/Nose/Throat Sore throat?: No Sinus problems?: No  Hematologic/Lymphatic Swollen glands?: No Easy bruising?: No  Cardiovascular Leg swelling?: No Chest pain?: No  Respiratory Cough?: No Shortness of breath?: No  Endocrine Excessive thirst?: No  Musculoskeletal Back pain?: No Joint pain?: No  Neurological Headaches?: No Dizziness?: No  Psychologic Depression?: No Anxiety?: No  Physical Exam: BP 119/72 mmHg  Pulse 75  Ht 5\' 4"  (1.626 m)  Wt 168 lb (76.204 kg)  BMI 28.82 kg/m2  LMP 12/07/2014  Constitutional:  Alert and oriented, No acute distress. HEENT: [place] AT, moist mucus membranes.  Trachea midline, no masses. Cardiovascular: No clubbing, cyanosis, or edema. Respiratory: Normal respiratory effort, no increased work of breathing. GI: Abdomen is soft, nontender, nondistended, no abdominal masses GU: No CVA tenderness. Gravid uterus above umbilicus. No LE edema. Skin: No rashes, bruises or suspicious  lesions. Lymph: No cervical or inguinal adenopathy. Neurologic: Grossly intact, no focal deficits, moving all 4 extremities. Psychiatric: Normal mood and affect.  Laboratory Data: Lab Results  Component Value Date   WBC 12.0* 05/14/2015   HGB 8.2* 05/14/2015   HCT 24.3* 05/14/2015   MCV 87.8 05/14/2015   PLT 207 05/14/2015    Lab Results  Component Value Date    CREATININE 0.90 05/12/2015    No results found for: PSA  No results found for: TESTOSTERONE  No results found for: HGBA1C  Urinalysis    Component Value Date/Time   COLORURINE YELLOW* 05/12/2015 1740   APPEARANCEUR Cloudy* 07/21/2015 1118   APPEARANCEUR CLEAR* 05/12/2015 1740   LABSPEC 1.031* 05/12/2015 1740   PHURINE 6.0 05/12/2015 1740   GLUCOSEU Negative 07/21/2015 1118   HGBUR 2+* 05/12/2015 1740   BILIRUBINUR Negative 07/21/2015 1118   BILIRUBINUR NEGATIVE 05/12/2015 1740   KETONESUR 2+* 05/12/2015 1740   PROTEINUR Negative 07/21/2015 1118   PROTEINUR 30* 05/12/2015 1740   NITRITE Negative 07/21/2015 1118   NITRITE NEGATIVE 05/12/2015 1740   LEUKOCYTESUR Trace* 07/21/2015 1118   LEUKOCYTESUR NEGATIVE 05/12/2015 1740    Pertinent Imaging:  as per HPI  Assessment & Plan:    1. Nephrolithiasis - small non-obstructing intra-renal stones. VERY unlikely these will impact rest of pregnancy. She would like to get stone free in elective setting after pregnancy over and I agree. Also STRONGLY emphasized importnace of GU eval with imaging in future BEFORE coming off any birth control or future elective pregnancies if possible.  2 - RTC 4mos with KUB, will likely be aroudn 3-8most post-partum by then.    Return in about 4 months (around 01/01/2016).  Sebastian Ache, MD  [facility] [address] [place], [place] [address] [phone]

## 2015-09-19 ENCOUNTER — Inpatient Hospital Stay
Admission: EM | Admit: 2015-09-19 | Discharge: 2015-09-22 | DRG: 775 | Disposition: A | Discharge status: Home / Self Care | Payer: Managed Care, Other (non HMO) | Attending: Certified Nurse Midwife | Admitting: Certified Nurse Midwife

## 2015-09-19 ENCOUNTER — Encounter: Payer: Self-pay | Admitting: *Deleted

## 2015-09-19 DIAGNOSIS — Z3A40 40 weeks gestation of pregnancy: Secondary | ICD-10-CM

## 2015-09-19 DIAGNOSIS — N200 Calculus of kidney: Secondary | ICD-10-CM | POA: Diagnosis present

## 2015-09-19 DIAGNOSIS — Z888 Allergy status to other drugs, medicaments and biological substances status: Secondary | ICD-10-CM | POA: Diagnosis not present

## 2015-09-19 DIAGNOSIS — O411230 Chorioamnionitis, third trimester, not applicable or unspecified: Secondary | ICD-10-CM | POA: Diagnosis present

## 2015-09-19 LAB — CHLAMYDIA/NGC RT PCR (ARMC ONLY)
CHLAMYDIA TR: NOT DETECTED
N GONORRHOEAE: NOT DETECTED

## 2015-09-19 LAB — CBC
HEMATOCRIT: 35.5 % (ref 35.0–47.0)
HEMOGLOBIN: 11.9 g/dL — AB (ref 12.0–16.0)
MCH: 29.2 pg (ref 26.0–34.0)
MCHC: 33.5 g/dL (ref 32.0–36.0)
MCV: 87.4 fL (ref 80.0–100.0)
Platelets: 253 10*3/uL (ref 150–440)
RBC: 4.06 MIL/uL (ref 3.80–5.20)
RDW: 15 % — ABNORMAL HIGH (ref 11.5–14.5)
WBC: 16.1 10*3/uL — ABNORMAL HIGH (ref 3.6–11.0)

## 2015-09-19 LAB — TYPE AND SCREEN
ABO/RH(D): B POS
Antibody Screen: NEGATIVE

## 2015-09-19 LAB — OB RESULTS CONSOLE GC/CHLAMYDIA
CHLAMYDIA, DNA PROBE: NEGATIVE
Gonorrhea: NEGATIVE

## 2015-09-19 MED ORDER — OXYTOCIN BOLUS FROM INFUSION
500.0000 mL | INTRAVENOUS | Status: DC
  Administered 2015-09-20: 500 mL via INTRAVENOUS

## 2015-09-19 MED ORDER — LACTATED RINGERS IV SOLN
500.0000 mL | INTRAVENOUS | Status: DC | PRN
  Administered 2015-09-20: 300 mL via INTRAVENOUS

## 2015-09-19 MED ORDER — AMMONIA AROMATIC IN INHA
0.3000 mL | Freq: Once | RESPIRATORY_TRACT | Status: DC | PRN
Start: 2015-09-19 — End: 2015-09-20

## 2015-09-19 MED ORDER — OXYTOCIN 40 UNITS IN LACTATED RINGERS INFUSION - SIMPLE MED
2.5000 [IU]/h | INTRAVENOUS | Status: DC
  Filled 2015-09-19: qty 1000

## 2015-09-19 MED ORDER — LIDOCAINE HCL (PF) 1 % IJ SOLN: 30.0000 mL | INTRAMUSCULAR | Status: DC | PRN

## 2015-09-19 MED ORDER — TERBUTALINE SULFATE 1 MG/ML IJ SOLN: 0.2500 mg | Freq: Once | INTRAMUSCULAR | Status: DC | PRN

## 2015-09-19 MED ORDER — BUTORPHANOL TARTRATE 1 MG/ML IJ SOLN
1.0000 mg | INTRAMUSCULAR | Status: DC | PRN
  Administered 2015-09-19: 1 mg via INTRAVENOUS
  Filled 2015-09-19: qty 1

## 2015-09-19 MED ORDER — ONDANSETRON HCL 4 MG/2ML IJ SOLN
4.0000 mg | Freq: Four times a day (QID) | INTRAMUSCULAR | Status: DC | PRN
  Administered 2015-09-20 (×2): 4 mg via INTRAVENOUS
  Filled 2015-09-19 (×2): qty 2

## 2015-09-19 MED ORDER — LACTATED RINGERS IV SOLN
INTRAVENOUS | Status: DC
  Administered 2015-09-19: 18:00:00 via INTRAVENOUS

## 2015-09-19 MED ORDER — OXYTOCIN 40 UNITS IN LACTATED RINGERS INFUSION - SIMPLE MED
1.0000 m[IU]/min | INTRAVENOUS | Status: DC
  Administered 2015-09-19: 1 m[IU]/min via INTRAVENOUS

## 2015-09-19 MED ORDER — MISOPROSTOL 200 MCG PO TABS
800.0000 ug | ORAL_TABLET | Freq: Once | ORAL | Status: AC | PRN
  Administered 2015-09-20: 800 ug via RECTAL

## 2015-09-19 NOTE — H&P (Signed)
OB History & Physical   History of Present Illness:  Chief Complaint:  "My bag of water started leaking at 4PM. It started out clear then it became green. My contractions started to get more regular after that. HPI:  Sherry Townsend is a 25 y.o. G1P0 female at [redacted]w[redacted]d dated by a 7wk3d ultrasound.  Her pregnancy has been complicated by nephrolithiasis.  She presented to L&D for evaluation of onset of labor/ SROM.  Patient was scheduled for IOL tomorrow night. Was 1 cm dilated in the office yesterday when she had a reactive NST and a normal AFI.    Prenatal care site: Prenatal care at [facility]. She desires to breastfeed. Received TDAP 4/24. Undecided about contraception      Maternal Medical History:   Past Medical History  Diagnosis Date  . Kidney stone   . Kidney stone complicating pregnancy 05/14/2015    Past Surgical History  Procedure Laterality Date  . Wisdom tooth extraction      Allergies  Allergen Reactions  . Toradol [Ketorolac Tromethamine] Other (See Comments)    Numbness of legs    Prior to Admission medications   Medication Sig Start Date End Date Taking? Authorizing Provider  ferrous sulfate 325 (65 FE) MG tablet Take 325 mg by mouth daily with breakfast.   Yes Historical Provider, MD  Prenatal Vit-Fe Fumarate-FA (PRENATAL MULTIVITAMIN) TABS tablet Take 1 tablet by mouth daily at 12 noon.   Yes Historical Provider, MD    Social History: She  reports that she has never smoked. She does not have any smokeless tobacco history on file. She reports that she does not drink alcohol or use illicit drugs.  Family History: family history includes Heart disease in her maternal grandmother; Nephrolithiasis in her maternal aunt and maternal grandfather.   Review of Systems: Negative x 10 systems reviewed except as noted in the HPI.      Physical Exam:  Vital Signs: BP 130/84 mmHg  Pulse 87  Temp(Src) 98.2 F (36.8 C) (Oral)  Resp 14  Ht  (1.626 m)  Wt 78.019 kg (172  lb)  BMI 29.51 kg/m2  LMP 12/07/2014 General: no acute distress.  HEENT: normocephalic, atraumatic Heart: regular rate & rhythm.  No murmurs Lungs: clear to auscultation bilaterally Abdomen: soft, gravid, non-tender;  EFW: 6#10oz Pelvic:   External: Normal external female genitalia  Cervix: Dilation: 1.5 / Effacement (%): 80 / Station: -1   ROM: grossly ruptured with moderate meconium stained amniotic fluid Extremities: non-tender, symmetric, trace edema bilaterally.  DTRs: +2 Neurologic: Alert & oriented x 3.    Pertinent Results:  Prenatal Labs: Blood type/Rh B positive  Antibody screen negative  Rubella varicella Immune immune  RPR Non reactive  HBsAg negative  HIV Negative  GC negative  Chlamydia negative  Genetic screening Informaseq negative (XX)  1 hour GTT 138  3 hour GTT NA  GBS negative on 5/8   Baseline FHR: baseline 140 with accelerations to 170s with moderate variability Toco: contractions q2-25min apart, palpate moderate   Assessment:  Sherry Townsend is a 25 y.o. G1P0 female at [redacted]w[redacted]d with SROM for meconium stained amniotic fluid and in early labor  Cat 1 tracing -monitor closely for fetal tolerance  No cervical change over the last couple hours, although contractions a little stronger Plan:  1. Admit to Labor & Delivery  2. CBC, T&S, Clrs, IVF 3. GBS negative.   4. Consents obtained. 5. Stadol for pain, epidural when progressing 6. Will start some gently  Pitocin augmentation.  Farrel ConnersGUTIERREZ, Shannyn Jankowiak  09/19/2015 10:13 PM

## 2015-09-20 ENCOUNTER — Inpatient Hospital Stay: Payer: Managed Care, Other (non HMO) | Admitting: Anesthesiology

## 2015-09-20 ENCOUNTER — Encounter: Payer: Self-pay | Admitting: Anesthesiology

## 2015-09-20 MED ORDER — BENZOCAINE-MENTHOL 20-0.5 % EX AERO
1.0000 "application " | INHALATION_SPRAY | CUTANEOUS | Status: DC | PRN
  Filled 2015-09-20: qty 56

## 2015-09-20 MED ORDER — MISOPROSTOL 200 MCG PO TABS
ORAL_TABLET | ORAL | Status: AC
  Filled 2015-09-20: qty 4

## 2015-09-20 MED ORDER — WITCH HAZEL-GLYCERIN EX PADS: 1.0000 "application " | MEDICATED_PAD | CUTANEOUS | Status: DC | PRN

## 2015-09-20 MED ORDER — NALOXONE HCL 0.4 MG/ML IJ SOLN: 0.4000 mg | INTRAMUSCULAR | Status: DC | PRN

## 2015-09-20 MED ORDER — HYDROCODONE-ACETAMINOPHEN 7.5-325 MG/15ML PO SOLN: 10.0000 mL | ORAL | Status: DC | PRN

## 2015-09-20 MED ORDER — DIPHENHYDRAMINE HCL 50 MG/ML IJ SOLN: 12.5000 mg | INTRAMUSCULAR | Status: DC | PRN

## 2015-09-20 MED ORDER — NALBUPHINE HCL 10 MG/ML IJ SOLN: 5.0000 mg | INTRAMUSCULAR | Status: DC | PRN

## 2015-09-20 MED ORDER — SODIUM CHLORIDE 0.9% FLUSH: 3.0000 mL | INTRAVENOUS | Status: DC | PRN

## 2015-09-20 MED ORDER — ONDANSETRON HCL 4 MG/2ML IJ SOLN: 4.0000 mg | Freq: Three times a day (TID) | INTRAMUSCULAR | Status: DC | PRN

## 2015-09-20 MED ORDER — IBUPROFEN 100 MG/5ML PO SUSP
600.0000 mg | Freq: Four times a day (QID) | ORAL | Status: DC | PRN
  Administered 2015-09-20 – 2015-09-21 (×5): 600 mg via ORAL
  Filled 2015-09-20 (×6): qty 30

## 2015-09-20 MED ORDER — NALBUPHINE HCL 10 MG/ML IJ SOLN: 5.0000 mg | Freq: Once | INTRAMUSCULAR | Status: DC | PRN

## 2015-09-20 MED ORDER — SODIUM CHLORIDE 0.9 % IV SOLN
INTRAVENOUS | Status: AC
  Administered 2015-09-20: 09:00:00
  Filled 2015-09-20: qty 2000

## 2015-09-20 MED ORDER — SODIUM CHLORIDE 0.9 % IV SOLN
INTRAVENOUS | Status: DC | PRN
  Administered 2015-09-20 (×2): 4 mL via EPIDURAL

## 2015-09-20 MED ORDER — DIBUCAINE 1 % RE OINT: 1.0000 "application " | TOPICAL_OINTMENT | RECTAL | Status: DC | PRN

## 2015-09-20 MED ORDER — FAMOTIDINE IN NACL 20-0.9 MG/50ML-% IV SOLN
20.0000 mg | Freq: Two times a day (BID) | INTRAVENOUS | Status: DC
  Administered 2015-09-20: 20 mg via INTRAVENOUS
  Filled 2015-09-20: qty 50

## 2015-09-20 MED ORDER — SODIUM CHLORIDE FLUSH 0.9 % IV SOLN
INTRAVENOUS | Status: AC
  Filled 2015-09-20: qty 30

## 2015-09-20 MED ORDER — NALOXONE HCL 2 MG/2ML IJ SOSY
1.0000 ug/kg/h | PREFILLED_SYRINGE | INTRAMUSCULAR | Status: DC | PRN
  Filled 2015-09-20: qty 2

## 2015-09-20 MED ORDER — FENTANYL 2.5 MCG/ML W/ROPIVACAINE 0.2% IN NS 100 ML EPIDURAL INFUSION (ARMC-ANES)
EPIDURAL | Status: AC
  Administered 2015-09-20: 10 mL/h via EPIDURAL
  Filled 2015-09-20: qty 100

## 2015-09-20 MED ORDER — LIDOCAINE-EPINEPHRINE (PF) 1.5 %-1:200000 IJ SOLN
INTRAMUSCULAR | Status: DC | PRN
  Administered 2015-09-20: 3 mL via EPIDURAL

## 2015-09-20 MED ORDER — ACETAMINOPHEN 160 MG/5ML PO SOLN
650.0000 mg | Freq: Four times a day (QID) | ORAL | Status: DC | PRN
  Filled 2015-09-20: qty 20.3

## 2015-09-20 MED ORDER — GENTAMICIN SULFATE 40 MG/ML IJ SOLN
100.0000 mg | Freq: Once | INTRAMUSCULAR | Status: DC
Start: 2015-09-20 — End: 2015-09-20

## 2015-09-20 MED ORDER — ONDANSETRON HCL 4 MG PO TABS: 4.0000 mg | ORAL_TABLET | ORAL | Status: DC | PRN

## 2015-09-20 MED ORDER — PRENATAL MULTIVITAMIN CH
1.0000 | ORAL_TABLET | Freq: Every day | ORAL | Status: DC
  Administered 2015-09-21: 1 via ORAL
  Filled 2015-09-20: qty 1

## 2015-09-20 MED ORDER — SODIUM CHLORIDE 0.9 % IV SOLN: 2.0000 g | Freq: Four times a day (QID) | INTRAVENOUS | Status: DC

## 2015-09-20 MED ORDER — GENTAMICIN SULFATE 40 MG/ML IJ SOLN
100.0000 mg | Freq: Once | INTRAVENOUS | Status: AC
  Administered 2015-09-20: 100 mg via INTRAVENOUS
  Filled 2015-09-20: qty 2.5

## 2015-09-20 MED ORDER — AMMONIA AROMATIC IN INHA
RESPIRATORY_TRACT | Status: AC
  Filled 2015-09-20: qty 10

## 2015-09-20 MED ORDER — GENTAMICIN IN SALINE 1.6-0.9 MG/ML-% IV SOLN
80.0000 mg | Freq: Once | INTRAVENOUS | Status: AC
  Administered 2015-09-20: 80 mg via INTRAVENOUS

## 2015-09-20 MED ORDER — COCONUT OIL OIL
1.0000 "application " | TOPICAL_OIL | Status: DC | PRN
  Administered 2015-09-20: 1 via TOPICAL
  Filled 2015-09-20: qty 120

## 2015-09-20 MED ORDER — MEPERIDINE HCL 25 MG/ML IJ SOLN: 6.2500 mg | INTRAMUSCULAR | Status: DC | PRN

## 2015-09-20 MED ORDER — DOCUSATE SODIUM 50 MG/5ML PO LIQD
100.0000 mg | Freq: Every day | ORAL | Status: DC
  Administered 2015-09-20 – 2015-09-21 (×2): 100 mg via ORAL
  Filled 2015-09-20 (×4): qty 10

## 2015-09-20 MED ORDER — OXYTOCIN 10 UNIT/ML IJ SOLN
INTRAMUSCULAR | Status: AC
  Filled 2015-09-20: qty 2

## 2015-09-20 MED ORDER — DIPHENHYDRAMINE HCL 25 MG PO CAPS: 25.0000 mg | ORAL_CAPSULE | ORAL | Status: DC | PRN

## 2015-09-20 MED ORDER — LIDOCAINE HCL (PF) 1 % IJ SOLN
INTRAMUSCULAR | Status: DC | PRN
  Administered 2015-09-20: 3 mL via SUBCUTANEOUS

## 2015-09-20 MED ORDER — FENTANYL 2.5 MCG/ML W/ROPIVACAINE 0.2% IN NS 100 ML EPIDURAL INFUSION (ARMC-ANES): 10.0000 mL/h | EPIDURAL | Status: DC

## 2015-09-20 MED ORDER — ONDANSETRON HCL 4 MG/2ML IJ SOLN: 4.0000 mg | INTRAMUSCULAR | Status: DC | PRN

## 2015-09-20 MED ORDER — ACETAMINOPHEN 160 MG/5ML PO SOLN
650.0000 mg | Freq: Once | ORAL | Status: AC
Start: 2015-09-20 — End: 2015-09-20
  Administered 2015-09-20: 640 mg via ORAL
  Filled 2015-09-20: qty 20
  Filled 2015-09-20: qty 5

## 2015-09-20 MED ORDER — SIMETHICONE 80 MG PO CHEW: 80.0000 mg | CHEWABLE_TABLET | ORAL | Status: DC | PRN

## 2015-09-20 MED ORDER — GENTAMICIN IN SALINE 1.6-0.9 MG/ML-% IV SOLN
80.0000 mg | Freq: Once | INTRAVENOUS | Status: DC
  Filled 2015-09-20: qty 50

## 2015-09-20 MED ORDER — LIDOCAINE HCL (PF) 1 % IJ SOLN
INTRAMUSCULAR | Status: AC
  Filled 2015-09-20: qty 30

## 2015-09-20 MED ORDER — AMPICILLIN SODIUM 2 G IJ SOLR
2.0000 g | Freq: Four times a day (QID) | INTRAMUSCULAR | Status: AC
  Administered 2015-09-20 (×2): 2 g via INTRAVENOUS
  Filled 2015-09-20 (×3): qty 2000

## 2015-09-20 NOTE — Anesthesia Procedure Notes (Signed)
Epidural Patient location during procedure: OB Start time: 09/20/2015 1:24 AM End time: 09/20/2015 1:33 AM  Staffing Anesthesiologist: Lenard SimmerKARENZ, Greycen Felter Performed by: anesthesiologist   Preanesthetic Checklist Completed: patient identified, site marked, surgical consent, pre-op evaluation, timeout performed, IV checked, risks and benefits discussed and monitors and equipment checked  Epidural Patient position: sitting Prep: ChloraPrep Patient monitoring: heart rate, continuous pulse ox and blood pressure Approach: midline Location: L3-L4 Injection technique: LOR saline  Needle:  Needle type: Tuohy  Needle gauge: 17 G Needle length: 9 cm and 9 Needle insertion depth: 5 cm Catheter type: closed end flexible Catheter size: 19 Gauge Catheter at skin depth: 9 cm Test dose: negative and 1.5% lidocaine with Epi 1:200 K  Assessment Events: blood not aspirated, injection not painful, no injection resistance, negative IV test and no paresthesia  Additional Notes   Patient tolerated the insertion well without complications.Reason for block:procedure for pain

## 2015-09-20 NOTE — Discharge Summary (Signed)
Physician Obstetric Discharge Summary  Patient ID: Sherry Townsend MRN: [id number] DOB/AGE: 26/08/1990 24 y.o.   Date of Admission: 09/19/2015  Date of Discharge: 09/22/2015  Admitting Diagnosis: Onset of Labor at 3989w0d  Secondary Diagnosis: Meconium stained amniotic fluid  Mode of Delivery: normal spontaneous vaginal delivery 09/20/2015 after unsuccessful vacuum assisted delivery     Discharge Diagnosis: Term intrauterine pregnancy-delivered at 41 weeks. Chorioamnionitis and fetal tachycardia, persistent occiput posterior   Intrapartum Procedures: epidural, pitocin augmentation and placement of intrauterine catheter/ antibiotics/ attempted vacuum assisted delivery   Post partum procedures: IV antibiotics  Complications:none   Brief Hospital Course  Sherry Townsend is a G1P1001 who had a SVD on 09/20/2015 complicated by chorioamnionitis and persistent OP presentation and thick meconium stained amniotic fluid;  for further details of this delivery, please refer to the delivery note.  Patient had an uncomplicated postpartum course. Her fever resolved on the day of delivery. By time of discharge on PPD#2, her pain was controlled on oral pain medications; she had appropriate lochia and was ambulating, voiding without difficulty and tolerating regular diet.  She was deemed stable for discharge to home.     Labs: CBC Latest Ref Rng 09/21/2015 09/19/2015 05/14/2015  WBC 3.6 - 11.0 K/uL 25.1(H) 16.1(H) 12.0(H)  Hemoglobin 12.0 - 16.0 g/dL 4.0(J9.1(L) 11.9(L) 8.2(L)  Hematocrit 35.0 - 47.0 % 26.9(L) 35.5 24.3(L)  Platelets 150 - 440 K/uL 190 253 207   B POS/ RI/ VI/ GBS neg  Physical exam:  Blood pressure 115/68, pulse 88, temperature 97.8 F (36.6 C), temperature source Oral, resp. rate 19, height 5\' 4"  (1.626 m), weight 78.019 kg (172 lb), last menstrual period 12/07/2014, SpO2 96 %, unknown if currently breastfeeding. General: alert and no distress Lochia: appropriate Abdomen: soft, NT Uterine  Fundus: NT/ firm at U and sl deviated to the left (needs to urinate) Extremities: No evidence of DVT seen on physical exam. +1 lower extremity edema.  Discharge Instructions: Per After Visit Summary. Activity: Advance as tolerated. Pelvic rest for 6 weeks.  Also refer to Discharge Instructions Diet: Regular Medications:   Medication List    TAKE these medications        ferrous sulfate 325 (65 FE) MG tablet  Take 325 mg by mouth daily with breakfast.     ibuprofen 100 MG/5ML suspension  Commonly known as:  ADVIL,MOTRIN  Take 30 mLs (600 mg total) by mouth every 6 (six) hours as needed for mild pain or moderate pain.     norethindrone 0.35 MG tablet  Commonly known as:  CAMILA  Take 1 tablet (0.35 mg total) by mouth daily.  Start taking on:  11/01/2015     oxyCODONE-acetaminophen 5-325 MG tablet  Commonly known as:  ROXICET  Take 1-2 tablets by mouth every 6 (six) hours as needed for severe pain.     prenatal multivitamin Tabs tablet  Take 1 tablet by mouth daily at 12 noon.       Outpatient follow up:  Follow-up Information    Schedule an appointment as soon as possible for a visit with Sherry Townsend, CNM.   Specialty:  Certified Nurse Midwife   Why:  6 week postpartum check   Contact information:   [address] [place] [address] [phone]      Postpartum contraception: oral progesterone-only contraceptive  Discharged Condition: stable  Discharged to: home   Newborn Data: Disposition:home with mother/ cultures were negative  Apgars: APGAR (1 MIN): 6   APGAR (5 MINS): 8  APGAR (10 MINS):    Baby Feeding: Breast/ Sherry Townsend, CNM 09/22/2015 1:01 PM

## 2015-09-20 NOTE — Progress Notes (Signed)
L&D Progress Note  S: c/o increased pain on her LLQ.  O: 127/68  completely dilated and 0 station since 0600. Labored down for about an hour before starting to push. Has been pushing intermittently for about an hour on her side with the peanut ball to try to get baby to turn OA from LOP position. Had to stop pushing as she began to have pain in her LLQ and began coughing and vomiting/dry heaving with contractions-so could not push. Turned to left side and anesthesia contacted about a bolus of the epidural. Epidural rate increased and was effective. Also begun on Pepcid and given Zofran. She has been feeling warm to touch, but just now has a fever of 100.4.  FHR 170s with accelerations to 180s, moderate variability, some decelerations with contractions/ but mostly the ultrasound gets pushed out of position with pushing. IUPC: contractions q1-4 min apart on 7 miu/min, will have runs of contractions then space. Station is +1 to +2.  A: Chorioamnionitis Occiput posterior presentation  P: Start ampicillin and gentamycin Tylenol 650 mgm po  Continue to push-may try to assist with vacuum if baby low enough.  Farrel ConnersGUTIERREZ, Yordy Matton, CNM

## 2015-09-20 NOTE — Progress Notes (Signed)
L&D Progress Note  S: Desires epidural, nauseous with contractions   O: 130/84, afebrile General: sitting up, coughing, vomiting/dry heaving with contractions FHR 140 with accelerations to 150s to 170s, moderate variability, occasional variable, early deceleration Cervix: 3/80%/-1 IUPC inserted after AROM of forebag-mvus 180 on 3 miu/min Pitocin  A/P: Patient very uncomfortable-will consult anesthesia for epidural After comfortable will titrate Pitocin to 200 mvus Overall reassuring FHR tracing.  Farrel ConnersGUTIERREZ, Sherry Townsend, CNM

## 2015-09-20 NOTE — Anesthesia Preprocedure Evaluation (Signed)
Anesthesia Evaluation  Patient identified by MRN, date of birth, ID band Patient awake    Reviewed: Allergy & Precautions, H&P , NPO status , Patient's Chart, lab work & pertinent test results, reviewed documented beta blocker date and time   History of Anesthesia Complications Negative for: history of anesthetic complications  Airway Mallampati: III  TM Distance: >3 FB Neck ROM: full    Dental no notable dental hx. (+) Teeth Intact   Pulmonary neg pulmonary ROS,    Pulmonary exam normal breath sounds clear to auscultation       Cardiovascular Exercise Tolerance: Good negative cardio ROS Normal cardiovascular exam Rhythm:regular Rate:Normal     Neuro/Psych negative neurological ROS  negative psych ROS   GI/Hepatic negative GI ROS, Neg liver ROS,   Endo/Other  negative endocrine ROS  Renal/GU Renal disease (kidney stones)  negative genitourinary   Musculoskeletal   Abdominal   Peds  Hematology negative hematology ROS (+)   Anesthesia Other Findings Past Medical History:   Kidney stone                                                 Kidney stone complicating pregnancy             05/14/2015     Reproductive/Obstetrics (+) Pregnancy                             Anesthesia Physical Anesthesia Plan  ASA: II  Anesthesia Plan: Epidural   Post-op Pain Management:    Induction:   Airway Management Planned:   Additional Equipment:   Intra-op Plan:   Post-operative Plan:   Informed Consent: I have reviewed the patients History and Physical, chart, labs and discussed the procedure including the risks, benefits and alternatives for the proposed anesthesia with the patient or authorized representative who has indicated his/her understanding and acceptance.   Dental Advisory Given  Plan Discussed with: Anesthesiologist, CRNA and Surgeon  Anesthesia Plan Comments:          Anesthesia Quick Evaluation

## 2015-09-21 LAB — RPR: RPR: NONREACTIVE

## 2015-09-21 LAB — CBC
HCT: 26.9 % — ABNORMAL LOW (ref 35.0–47.0)
Hemoglobin: 9.1 g/dL — ABNORMAL LOW (ref 12.0–16.0)
MCH: 29.6 pg (ref 26.0–34.0)
MCHC: 33.9 g/dL (ref 32.0–36.0)
MCV: 87.3 fL (ref 80.0–100.0)
PLATELETS: 190 10*3/uL (ref 150–440)
RBC: 3.08 MIL/uL — AB (ref 3.80–5.20)
RDW: 15.3 % — ABNORMAL HIGH (ref 11.5–14.5)
WBC: 25.1 10*3/uL — ABNORMAL HIGH (ref 3.6–11.0)

## 2015-09-21 NOTE — Progress Notes (Signed)
Pt reportedly doing well (per nursing) Unable to see patient as multiple attempts, she is in [facility] or other places Counseled to call with any concerns

## 2015-09-21 NOTE — Anesthesia Postprocedure Evaluation (Signed)
Anesthesia Post Note  Patient: Sherry Townsend  Procedure(s) Performed: * No procedures listed *  Patient location during evaluation: Mother Baby Anesthesia Type: Epidural Level of consciousness: awake and alert Pain management: pain level controlled Vital Signs Assessment: post-procedure vital signs reviewed and stable Respiratory status: spontaneous breathing Cardiovascular status: stable Postop Assessment: no headache Anesthetic complications: no    Last Vitals:  Filed Vitals:   09/20/15 2327 09/21/15 0404  BP: 105/60 103/67  Pulse: 102 82  Temp: 36.7 C 36.4 C  Resp: 18 12    Last Pain:  Filed Vitals:   09/21/15 0554  PainSc: 2                  Calbert Hulsebus,  Alessandra BevelsJennifer M

## 2015-09-22 LAB — SURGICAL PATHOLOGY

## 2015-09-22 MED ORDER — IBUPROFEN 100 MG/5ML PO SUSP: 600.0000 mg | Freq: Four times a day (QID) | ORAL | Status: DC | PRN

## 2015-09-22 MED ORDER — NORETHINDRONE 0.35 MG PO TABS: 1.0000 | ORAL_TABLET | Freq: Every day | ORAL | Status: DC

## 2015-09-22 NOTE — Progress Notes (Signed)
D/C order from MD.  Reviewed d/c instructions and prescriptions with patient and answered any questions.  Patient d/c home with infant via wheelchair by nursing/auxillary. 

## 2015-09-22 NOTE — Discharge Instructions (Signed)
Vaginal Delivery, Care After Refer to this sheet in the next few weeks. These discharge instructions provide you with information on caring for yourself after delivery. Your caregiver may also give you specific instructions. Your treatment has been planned according to the most current medical practices available, but problems sometimes occur. Call your caregiver if you have any problems or questions after you go home. HOME CARE INSTRUCTIONS 1. Take over-the-counter or prescription medicines only as directed by your caregiver or pharmacist. 2. Do not drink alcohol, especially if you are breastfeeding or taking medicine to relieve pain. 3. Do not smoke tobacco. 4. Continue to use good perineal care. Good perineal care includes: 1. Wiping your perineum from back to front 2. Keeping your perineum clean. 3. You can do sitz baths twice a day, to help keep this area clean 5. Do not use tampons, douche or have sex until your caregiver says it is okay. 6. Shower only and avoid sitting in submerged water, aside from sitz baths 7. Wear a well-fitting bra that provides breast support. 8. Eat healthy foods. 9. Drink enough fluids to keep your urine clear or pale yellow. 10. Eat high-fiber foods such as whole grain cereals and breads, brown rice, beans, and fresh fruits and vegetables every day. These foods may help prevent or relieve constipation. 11. Avoid constipation with high fiber foods or medications, such as miralax or metamucil 12. Follow your caregiver's recommendations regarding resumption of activities such as climbing stairs, driving, lifting, exercising, or traveling. No heavy lifting x 4 weeks. No driving x 1 week. 13. No tampons, douching or intercourse x 6 weeks 14. Try to have someone help you with your household activities and your newborn for at least a few days after you leave the hospital. 15. Rest as much as possible. Try to rest or take a nap when your newborn is  sleeping. 16. Increase your activities gradually. 17. Keep all of your scheduled postpartum appointments. It is very important to keep your scheduled follow-up appointments. At these appointments, your caregiver will be checking to make sure that you are healing physically and emotionally. SEEK MEDICAL CARE IF:   You are passing large clots from your vagina. Save any clots to show your caregiver.  You have a foul smelling discharge from your vagina.  You have trouble urinating.  You are urinating frequently.  You have pain when you urinate.  You have a change in your bowel movements.  You have increasing redness, pain, or swelling near your vaginal incision (episiotomy) or vaginal tear.  You have pus draining from your episiotomy or vaginal tear.  Your episiotomy or vaginal tear is separating.  You have painful, hard, or reddened breasts.  You have a severe headache.  You have blurred vision or see spots.  You feel sad or depressed.  You have thoughts of hurting yourself or your newborn.  You have questions about your care, the care of your newborn, or medicines.  You are dizzy or light-headed.  You have a rash.  You have nausea or vomiting.  You were breastfeeding and have not had a menstrual period within 12 weeks after you stopped breastfeeding.  You are not breastfeeding and have not had a menstrual period by the 12th week after delivery.  You have a fever 100.4 or more SEEK IMMEDIATE MEDICAL CARE IF:   You have persistent pain.  You have chest pain.  You have shortness of breath.  You faint.  You have leg pain.  You  have stomach pain.  Your vaginal bleeding saturates three or more sanitary pads in 1 hour. MAKE SURE YOU:   Understand these instructions.  Will watch your condition.  Will get help right away if you are not doing well or get worse. Document Released: 03/25/2000 Document Revised: 08/12/2013 Document Reviewed: 11/23/2011 [facility]  Patient Information 2015 [place], [place]. This information is not intended to replace advice given to you by your health care provider. Make sure you discuss any questions you have with your health care provider.  Sitz Bath A sitz bath is a warm water bath taken in the sitting position. The water covers only the hips and butt (buttocks). We recommend using one that fits in the toilet, to help with ease of use and cleanliness. It may be used for either healing or cleaning purposes. Sitz baths are also used to relieve pain, itching, or muscle tightening (spasms). The water may contain medicine. Moist heat will help you heal and relax.  HOME CARE  Take 3 to 4 sitz baths a day. 18. Fill the bathtub half-full with warm water. 19. Sit in the water and open the drain a little. 20. Turn on the warm water to keep the tub half-full. Keep the water running constantly. 21. Soak in the water for 15 to 20 minutes. 22. After the sitz bath, pat the affected area dry. GET HELP RIGHT AWAY IF: You get worse instead of better. Stop the sitz baths if you get worse. MAKE SURE YOU:  Understand these instructions.  Will watch your condition.  Will get help right away if you are not doing well or get worse. Document Released: 05/05/2004 Document Revised: 12/21/2011 Document Reviewed: 07/26/2010 [facility] Patient Information 2015 [place], [place]. This information is not intended to replace advice given to you by your health care provider. Make sure you discuss any questions you have with your health care provider.

## 2015-12-15 ENCOUNTER — Ambulatory Visit (INDEPENDENT_AMBULATORY_CARE_PROVIDER_SITE_OTHER): Payer: Managed Care, Other (non HMO) | Admitting: Urology

## 2015-12-15 VITALS — Ht 63.5 in | Wt 134.6 lb

## 2015-12-15 DIAGNOSIS — N200 Calculus of kidney: Secondary | ICD-10-CM

## 2015-12-15 LAB — URINALYSIS, COMPLETE
Bilirubin, UA: NEGATIVE
GLUCOSE, UA: NEGATIVE
KETONES UA: NEGATIVE
Nitrite, UA: NEGATIVE
SPEC GRAV UA: 1.01 (ref 1.005–1.030)
Urobilinogen, Ur: 0.2 mg/dL (ref 0.2–1.0)
pH, UA: 6 (ref 5.0–7.5)

## 2015-12-15 LAB — MICROSCOPIC EXAMINATION
RBC, UA: >30 /hpf — AB (ref 0–?)
WBC, UA: >30 /hpf — AB (ref 0–?)

## 2015-12-15 NOTE — Progress Notes (Signed)
12/15/2015 9:57 AM   Sherry Townsend 05/14/1990 [id number]  Referring provider: Nadara Mustardobert P Harris, MD [address] [place], [place] [address]  Chief Complaint  Patient presents with  . Nephrolithiasis    HPI: Sherry Townsend is a 24yo seen in followup for nephrolithiasis and ureteral calculus.  She has intermittent moderate sharp left flank pain. She underwent CT scan which showed a 5mm left UVJ calculus, 13mm left renal and 3mm right renal calculus. Pain is well controlled currently      PMH: Past Medical History:  Diagnosis Date  . Kidney stone   . Kidney stone complicating pregnancy 05/14/2015    Surgical History: Past Surgical History:  Procedure Laterality Date  . WISDOM TOOTH EXTRACTION      Home Medications:    Medication List       Accurate as of 12/15/15  9:57 AM. Always use your most recent med list.          HYDROcodone-acetaminophen 5-325 MG tablet Commonly known as:  NORCO/VICODIN Take by mouth.   ibuprofen 100 MG/5ML suspension Commonly known as:  ADVIL,MOTRIN Take 30 mLs (600 mg total) by mouth every 6 (six) hours as needed for mild pain or moderate pain.   norethindrone 0.35 MG tablet Commonly known as:  MICRONOR,CAMILA,ERRIN Take 1 tablet by mouth daily.   promethazine 25 MG tablet Commonly known as:  PHENERGAN Take by mouth.       Allergies:  Allergies  Allergen Reactions  . Toradol [Ketorolac Tromethamine] Other (See Comments)    Numbness of legs    Family History: Family History  Problem Relation Age of Onset  . Nephrolithiasis Maternal Grandfather   . Nephrolithiasis Maternal Aunt   . Heart disease Maternal Grandmother     Social History:  reports that she has never smoked. She does not have any smokeless tobacco history on file. She reports that she does not drink alcohol or use drugs.  ROS: UROLOGY Frequent Urination?: No Hard to postpone urination?: No Burning/pain with urination?: No Get up at night to urinate?:  No Leakage of urine?: No Urine stream starts and stops?: No Trouble starting stream?: No Do you have to strain to urinate?: No Blood in urine?: No Urinary tract infection?: No Sexually transmitted disease?: No Injury to kidneys or bladder?: No Painful intercourse?: No Weak stream?: No Currently pregnant?: No Vaginal bleeding?: No Last menstrual period?: No  Gastrointestinal Nausea?: No Vomiting?: No Indigestion/heartburn?: No Diarrhea?: No Constipation?: No  Constitutional Fever: No Night sweats?: No Weight loss?: No Fatigue?: No  Skin Skin rash/lesions?: No Itching?: No  Eyes Blurred vision?: No Double vision?: No  Ears/Nose/Throat Sore throat?: No Sinus problems?: No  Hematologic/Lymphatic Swollen glands?: No Easy bruising?: No  Cardiovascular Leg swelling?: No Chest pain?: No  Respiratory Cough?: No Shortness of breath?: No  Endocrine Excessive thirst?: No  Musculoskeletal Back pain?: No Joint pain?: No  Neurological Headaches?: No Dizziness?: No  Psychologic Depression?: No Anxiety?: No  Physical Exam: Ht 5' 3.5" (1.613 m)   Wt 61.1 kg (134 lb 9.6 oz)   BMI 23.47 kg/m   Constitutional:  Alert and oriented, No acute distress. HEENT: [place] AT, moist mucus membranes.  Trachea midline, no masses. Cardiovascular: No clubbing, cyanosis, or edema. Respiratory: Normal respiratory effort, no increased work of breathing. GI: Abdomen is soft, nontender, nondistended, no abdominal masses GU: No CVA tenderness.  Skin: No rashes, bruises or suspicious lesions. Lymph: No cervical or inguinal adenopathy. Neurologic: Grossly intact, no focal deficits, moving all 4 extremities.  Psychiatric: Normal mood and affect.  Laboratory Data: Lab Results  Component Value Date   WBC 25.1 (H) 09/21/2015   HGB 9.1 (L) 09/21/2015   HCT 26.9 (L) 09/21/2015   MCV 87.3 09/21/2015   PLT 190 09/21/2015    Lab Results  Component Value Date   CREATININE 0.90  05/12/2015    No results found for: PSA  No results found for: TESTOSTERONE  No results found for: HGBA1C  Urinalysis    Component Value Date/Time   COLORURINE YELLOW (A) 05/12/2015 1740   APPEARANCEUR Cloudy (A) 07/21/2015 1118   LABSPEC 1.031 (H) 05/12/2015 1740   PHURINE 6.0 05/12/2015 1740   GLUCOSEU Negative 07/21/2015 1118   HGBUR 2+ (A) 05/12/2015 1740   BILIRUBINUR Negative 07/21/2015 1118   KETONESUR 2+ (A) 05/12/2015 1740   PROTEINUR Negative 07/21/2015 1118   PROTEINUR 30 (A) 05/12/2015 1740   NITRITE Negative 07/21/2015 1118   NITRITE NEGATIVE 05/12/2015 1740   LEUKOCYTESUR Trace (A) 07/21/2015 1118    Pertinent Imaging: CT stone study  Assessment & Plan:    1. Nephrolithiasis - Schedule for bilateral URS - Urinalysis, Complete - CULTURE, URINE COMPREHENSIVE   No Follow-up on file.  Wilkie Aye, MD  [facility] [address], [address] [place], [place] [address] [phone]

## 2015-12-16 ENCOUNTER — Other Ambulatory Visit: Payer: Self-pay | Admitting: Radiology

## 2015-12-16 ENCOUNTER — Telehealth: Payer: Self-pay | Admitting: Radiology

## 2015-12-16 DIAGNOSIS — N200 Calculus of kidney: Secondary | ICD-10-CM

## 2015-12-16 NOTE — Telephone Encounter (Signed)
Notified patient of surgery scheduled with Dr. Apolinar JunesBrandon on 12/28/2015,pre-admit phone interview on 12/18/2015 between 9 AM and 1p.m., and to call Friday prior to surgery for arrival time to same day surgery. Patient voices understanding.

## 2015-12-18 ENCOUNTER — Encounter
Admission: RE | Admit: 2015-12-18 | Discharge: 2015-12-18 | Disposition: A | Discharge status: Home / Self Care | Payer: Managed Care, Other (non HMO) | Source: Ambulatory Visit | Attending: Urology | Admitting: Urology

## 2015-12-18 HISTORY — DX: Anemia, unspecified: D64.9

## 2015-12-18 LAB — CULTURE, URINE COMPREHENSIVE

## 2015-12-18 NOTE — Patient Instructions (Signed)
  Your procedure is scheduled on: 12-28-15 Report to Same Day Surgery 2nd floor medical mall To find out your arrival time please call [phone] between 1PM - 3PM on 12-25-15  Remember: Instructions that are not followed completely may result in serious medical risk, up to and including death, or upon the discretion of your surgeon and anesthesiologist your surgery may need to be rescheduled.    _x___ 1. Do not eat food or drink liquids after midnight. No gum chewing or hard candies.     __x__ 2. No Alcohol for 24 hours before or after surgery.   __x__3. No Smoking for 24 prior to surgery.   ____  4. Bring all medications with you on the day of surgery if instructed.    __x__ 5. Notify your doctor if there is any change in your medical condition     (cold, fever, infections).     Do not wear jewelry, make-up, hairpins, clips or nail polish.  Do not wear lotions, powders, or perfumes. You may wear deodorant.  Do not shave 48 hours prior to surgery. Men may shave face and neck.  Do not bring valuables to the hospital.    [facility] is not responsible for any belongings or valuables.               Contacts, dentures or bridgework may not be worn into surgery.  Leave your suitcase in the car. After surgery it may be brought to your room.  For patients admitted to the hospital, discharge time is determined by your treatment team.   Patients discharged the day of surgery will not be allowed to drive home.    Please read over the following fact sheets that you were given:   [facility] Preparing for Surgery and or MRSA Information   ____ Take these medicines the morning of surgery with A SIP OF WATER:    1. NONE  2.  3.  4.  5.  6.  ____ Fleet Enema (as directed)   ____ Use CHG Soap or sage wipes as directed on instruction sheet   ____ Use inhalers on the day of surgery and bring to hospital day of surgery  ____ Stop metformin 2 days prior to surgery    ____ Take 1/2 of  usual insulin dose the night before surgery and none on the morning of  surgery.   ____ Stop aspirin or coumadin, or plavix  _x__ Stop Anti-inflammatories such as Advil, Aleve, Ibuprofen, Motrin, Naproxen,          Naprosyn, Goodies powders or aspirin products 7 DAYS PRIOR TO SURGERY-Ok to take Tylenol/ HYDROCODONE  ____ Stop supplements until after surgery.    ____ Bring C-Pap to the hospital.

## 2015-12-28 ENCOUNTER — Ambulatory Visit
Admission: RE | Admit: 2015-12-28 | Discharge: 2015-12-28 | Disposition: A | Discharge status: Home / Self Care | Payer: Managed Care, Other (non HMO) | Source: Ambulatory Visit | Attending: Urology | Admitting: Urology

## 2015-12-28 ENCOUNTER — Encounter
Admission: RE | Disposition: A | Discharge status: Home / Self Care | Payer: Self-pay | Source: Ambulatory Visit | Attending: Urology

## 2015-12-28 ENCOUNTER — Ambulatory Visit: Payer: Managed Care, Other (non HMO) | Admitting: Anesthesiology

## 2015-12-28 ENCOUNTER — Ambulatory Visit: Payer: Managed Care, Other (non HMO)

## 2015-12-28 DIAGNOSIS — Z87442 Personal history of urinary calculi: Secondary | ICD-10-CM | POA: Insufficient documentation

## 2015-12-28 DIAGNOSIS — Z8249 Family history of ischemic heart disease and other diseases of the circulatory system: Secondary | ICD-10-CM | POA: Diagnosis not present

## 2015-12-28 DIAGNOSIS — Z888 Allergy status to other drugs, medicaments and biological substances status: Secondary | ICD-10-CM | POA: Diagnosis not present

## 2015-12-28 DIAGNOSIS — Z79899 Other long term (current) drug therapy: Secondary | ICD-10-CM | POA: Diagnosis not present

## 2015-12-28 DIAGNOSIS — D649 Anemia, unspecified: Secondary | ICD-10-CM | POA: Insufficient documentation

## 2015-12-28 DIAGNOSIS — Z841 Family history of disorders of kidney and ureter: Secondary | ICD-10-CM | POA: Insufficient documentation

## 2015-12-28 DIAGNOSIS — N200 Calculus of kidney: Secondary | ICD-10-CM

## 2015-12-28 HISTORY — PX: CYSTOSCOPY W/ RETROGRADES: SHX1426

## 2015-12-28 HISTORY — PX: CYSTOSCOPY WITH STENT PLACEMENT: SHX5790

## 2015-12-28 HISTORY — PX: URETEROSCOPY WITH HOLMIUM LASER LITHOTRIPSY: SHX6645

## 2015-12-28 LAB — POCT PREGNANCY, URINE: PREG TEST UR: NEGATIVE

## 2015-12-28 SURGERY — CYSTOSCOPY, WITH RETROGRADE PYELOGRAM
Anesthesia: General | Site: Ureter | Laterality: Left | Wound class: Clean Contaminated

## 2015-12-28 MED ORDER — ONDANSETRON HCL 4 MG/2ML IJ SOLN
4.0000 mg | Freq: Once | INTRAMUSCULAR | Status: AC | PRN
  Administered 2015-12-28: 4 mg via INTRAVENOUS

## 2015-12-28 MED ORDER — FENTANYL CITRATE (PF) 100 MCG/2ML IJ SOLN
INTRAMUSCULAR | Status: DC | PRN
  Administered 2015-12-28: 200 ug via INTRAVENOUS

## 2015-12-28 MED ORDER — OXYBUTYNIN CHLORIDE 5 MG PO TABS: 5.0000 mg | ORAL_TABLET | Freq: Three times a day (TID) | ORAL | 0 refills | Status: DC | PRN

## 2015-12-28 MED ORDER — CEFAZOLIN SODIUM-DEXTROSE 2-4 GM/100ML-% IV SOLN
2.0000 g | INTRAVENOUS | Status: AC
  Administered 2015-12-28: 2 g via INTRAVENOUS

## 2015-12-28 MED ORDER — FAMOTIDINE 20 MG PO TABS
ORAL_TABLET | ORAL | Status: AC
  Administered 2015-12-28: 20 mg via ORAL
  Filled 2015-12-28: qty 1

## 2015-12-28 MED ORDER — ACETAMINOPHEN 10 MG/ML IV SOLN
INTRAVENOUS | Status: AC
  Filled 2015-12-28: qty 100

## 2015-12-28 MED ORDER — ONDANSETRON HCL 4 MG/2ML IJ SOLN
INTRAMUSCULAR | Status: AC
  Administered 2015-12-28: 4 mg via INTRAVENOUS
  Filled 2015-12-28: qty 2

## 2015-12-28 MED ORDER — FENTANYL CITRATE (PF) 100 MCG/2ML IJ SOLN
25.0000 ug | INTRAMUSCULAR | Status: DC | PRN
  Administered 2015-12-28 (×2): 25 ug via INTRAVENOUS

## 2015-12-28 MED ORDER — LIDOCAINE HCL (CARDIAC) 20 MG/ML IV SOLN
INTRAVENOUS | Status: DC | PRN
  Administered 2015-12-28: 100 mg via INTRAVENOUS

## 2015-12-28 MED ORDER — CEFAZOLIN SODIUM-DEXTROSE 2-4 GM/100ML-% IV SOLN
INTRAVENOUS | Status: AC
  Administered 2015-12-28: 2 g via INTRAVENOUS
  Filled 2015-12-28: qty 100

## 2015-12-28 MED ORDER — HYDROMORPHONE HCL 1 MG/ML IJ SOLN
INTRAMUSCULAR | Status: DC | PRN
  Administered 2015-12-28: 1 mg via INTRAVENOUS

## 2015-12-28 MED ORDER — TAMSULOSIN HCL 0.4 MG PO CAPS: 0.4000 mg | ORAL_CAPSULE | Freq: Every day | ORAL | 0 refills | Status: DC

## 2015-12-28 MED ORDER — FAMOTIDINE 20 MG PO TABS
20.0000 mg | ORAL_TABLET | Freq: Once | ORAL | Status: AC
  Administered 2015-12-28: 20 mg via ORAL

## 2015-12-28 MED ORDER — LACTATED RINGERS IV SOLN
INTRAVENOUS | Status: DC
  Administered 2015-12-28: 11:00:00 via INTRAVENOUS

## 2015-12-28 MED ORDER — PROPOFOL 10 MG/ML IV BOLUS
INTRAVENOUS | Status: DC | PRN
  Administered 2015-12-28: 150 mg via INTRAVENOUS

## 2015-12-28 MED ORDER — FENTANYL CITRATE (PF) 100 MCG/2ML IJ SOLN
INTRAMUSCULAR | Status: AC
  Administered 2015-12-28: 25 ug via INTRAVENOUS
  Filled 2015-12-28: qty 2

## 2015-12-28 MED ORDER — ONDANSETRON HCL 4 MG/2ML IJ SOLN
INTRAMUSCULAR | Status: DC | PRN
  Administered 2015-12-28: 4 mg via INTRAVENOUS

## 2015-12-28 MED ORDER — DEXAMETHASONE SODIUM PHOSPHATE 4 MG/ML IJ SOLN
INTRAMUSCULAR | Status: DC | PRN
  Administered 2015-12-28: 5 mg via INTRAVENOUS

## 2015-12-28 MED ORDER — ACETAMINOPHEN 10 MG/ML IV SOLN
INTRAVENOUS | Status: DC | PRN
  Administered 2015-12-28: 1000 mg via INTRAVENOUS

## 2015-12-28 MED ORDER — IOPAMIDOL (ISOVUE-300) INJECTION 61%
INTRAVENOUS | Status: DC | PRN
  Administered 2015-12-28: 3 mL via URETHRAL

## 2015-12-28 MED ORDER — ROCURONIUM BROMIDE 100 MG/10ML IV SOLN
INTRAVENOUS | Status: DC | PRN
Start: 2015-12-28 — End: 2015-12-28
  Administered 2015-12-28: 40 mg via INTRAVENOUS

## 2015-12-28 MED ORDER — HYDROCODONE-ACETAMINOPHEN 5-325 MG PO TABS: 1.0000 | ORAL_TABLET | Freq: Four times a day (QID) | ORAL | 0 refills | Status: DC | PRN

## 2015-12-28 SURGICAL SUPPLY — 28 items
BAG DRAIN CYSTO-URO LG1000N (MISCELLANEOUS) ×3 IMPLANT
BASKET ZERO TIP 1.9FR (BASKET) ×3 IMPLANT
CATH FOL 2WAY LX 16X5 (CATHETERS) IMPLANT
CATH URETL 5X70 OPEN END (CATHETERS) ×3 IMPLANT
CONRAY 43 FOR UROLOGY 50M (MISCELLANEOUS) ×3 IMPLANT
DRAPE UTILITY 15X26 TOWEL STRL (DRAPES) ×3 IMPLANT
FIBER LASER LITHO 273 (Laser) ×3 IMPLANT
GLOVE BIO SURGEON STRL SZ 6.5 (GLOVE) ×3 IMPLANT
GOWN STRL REUS W/ TWL LRG LVL3 (GOWN DISPOSABLE) ×4 IMPLANT
GOWN STRL REUS W/ TWL LRG LVL4 (GOWN DISPOSABLE) ×4 IMPLANT
GOWN STRL REUS W/TWL LRG LVL3 (GOWN DISPOSABLE) ×2
GOWN STRL REUS W/TWL LRG LVL4 (GOWN DISPOSABLE) ×2
GUIDEWIRE SUPER STIFF (WIRE) ×3 IMPLANT
HOLDER FOLEY CATH W/STRAP (MISCELLANEOUS) IMPLANT
KIT RM TURNOVER CYSTO AR (KITS) ×3 IMPLANT
PACK CYSTO AR (MISCELLANEOUS) ×3 IMPLANT
PREP PVP WINGED SPONGE (MISCELLANEOUS) IMPLANT
SENSORWIRE 0.038 NOT ANGLED (WIRE) ×3
SET CYSTO W/LG BORE CLAMP LF (SET/KITS/TRAYS/PACK) ×3 IMPLANT
SHEATH URETERAL 12FRX35CM (MISCELLANEOUS) ×3 IMPLANT
SOL .9 NS 3000ML IRR  AL (IV SOLUTION) ×1
SOL .9 NS 3000ML IRR UROMATIC (IV SOLUTION) ×2 IMPLANT
STENT URET 6FRX24 CONTOUR (STENTS) ×3 IMPLANT
STENT URET 6FRX26 CONTOUR (STENTS) IMPLANT
SURGILUBE 2OZ TUBE FLIPTOP (MISCELLANEOUS) ×3 IMPLANT
SYRINGE IRR TOOMEY STRL 70CC (SYRINGE) ×3 IMPLANT
WATER STERILE IRR 1000ML POUR (IV SOLUTION) ×3 IMPLANT
WIRE SENSOR 0.038 NOT ANGLED (WIRE) ×2 IMPLANT

## 2015-12-28 NOTE — H&P (View-Only) (Signed)
12/15/2015 9:57 AM   Sherry Townsend 05/14/1990 [id number]  Referring provider: Nadara Mustardobert P Harris, MD [address] [place], [place] [address]  Chief Complaint  Patient presents with  . Nephrolithiasis    HPI: Sherry Townsend is a 24yo seen in followup for nephrolithiasis and ureteral calculus.  She has intermittent moderate sharp left flank pain. She underwent CT scan which showed a 5mm left UVJ calculus, 13mm left renal and 3mm right renal calculus. Pain is well controlled currently      PMH: Past Medical History:  Diagnosis Date  . Kidney stone   . Kidney stone complicating pregnancy 05/14/2015    Surgical History: Past Surgical History:  Procedure Laterality Date  . WISDOM TOOTH EXTRACTION      Home Medications:    Medication List       Accurate as of 12/15/15  9:57 AM. Always use your most recent med list.          HYDROcodone-acetaminophen 5-325 MG tablet Commonly known as:  NORCO/VICODIN Take by mouth.   ibuprofen 100 MG/5ML suspension Commonly known as:  ADVIL,MOTRIN Take 30 mLs (600 mg total) by mouth every 6 (six) hours as needed for mild pain or moderate pain.   norethindrone 0.35 MG tablet Commonly known as:  MICRONOR,CAMILA,ERRIN Take 1 tablet by mouth daily.   promethazine 25 MG tablet Commonly known as:  PHENERGAN Take by mouth.       Allergies:  Allergies  Allergen Reactions  . Toradol [Ketorolac Tromethamine] Other (See Comments)    Numbness of legs    Family History: Family History  Problem Relation Age of Onset  . Nephrolithiasis Maternal Grandfather   . Nephrolithiasis Maternal Aunt   . Heart disease Maternal Grandmother     Social History:  reports that she has never smoked. She does not have any smokeless tobacco history on file. She reports that she does not drink alcohol or use drugs.  ROS: UROLOGY Frequent Urination?: No Hard to postpone urination?: No Burning/pain with urination?: No Get up at night to urinate?:  No Leakage of urine?: No Urine stream starts and stops?: No Trouble starting stream?: No Do you have to strain to urinate?: No Blood in urine?: No Urinary tract infection?: No Sexually transmitted disease?: No Injury to kidneys or bladder?: No Painful intercourse?: No Weak stream?: No Currently pregnant?: No Vaginal bleeding?: No Last menstrual period?: No  Gastrointestinal Nausea?: No Vomiting?: No Indigestion/heartburn?: No Diarrhea?: No Constipation?: No  Constitutional Fever: No Night sweats?: No Weight loss?: No Fatigue?: No  Skin Skin rash/lesions?: No Itching?: No  Eyes Blurred vision?: No Double vision?: No  Ears/Nose/Throat Sore throat?: No Sinus problems?: No  Hematologic/Lymphatic Swollen glands?: No Easy bruising?: No  Cardiovascular Leg swelling?: No Chest pain?: No  Respiratory Cough?: No Shortness of breath?: No  Endocrine Excessive thirst?: No  Musculoskeletal Back pain?: No Joint pain?: No  Neurological Headaches?: No Dizziness?: No  Psychologic Depression?: No Anxiety?: No  Physical Exam: Ht 5' 3.5" (1.613 m)   Wt 61.1 kg (134 lb 9.6 oz)   BMI 23.47 kg/m   Constitutional:  Alert and oriented, No acute distress. HEENT: [place] AT, moist mucus membranes.  Trachea midline, no masses. Cardiovascular: No clubbing, cyanosis, or edema. Respiratory: Normal respiratory effort, no increased work of breathing. GI: Abdomen is soft, nontender, nondistended, no abdominal masses GU: No CVA tenderness.  Skin: No rashes, bruises or suspicious lesions. Lymph: No cervical or inguinal adenopathy. Neurologic: Grossly intact, no focal deficits, moving all 4 extremities.  Psychiatric: Normal mood and affect.  Laboratory Data: Lab Results  Component Value Date   WBC 25.1 (H) 09/21/2015   HGB 9.1 (L) 09/21/2015   HCT 26.9 (L) 09/21/2015   MCV 87.3 09/21/2015   PLT 190 09/21/2015    Lab Results  Component Value Date   CREATININE 0.90  05/12/2015    No results found for: PSA  No results found for: TESTOSTERONE  No results found for: HGBA1C  Urinalysis    Component Value Date/Time   COLORURINE YELLOW (A) 05/12/2015 1740   APPEARANCEUR Cloudy (A) 07/21/2015 1118   LABSPEC 1.031 (H) 05/12/2015 1740   PHURINE 6.0 05/12/2015 1740   GLUCOSEU Negative 07/21/2015 1118   HGBUR 2+ (A) 05/12/2015 1740   BILIRUBINUR Negative 07/21/2015 1118   KETONESUR 2+ (A) 05/12/2015 1740   PROTEINUR Negative 07/21/2015 1118   PROTEINUR 30 (A) 05/12/2015 1740   NITRITE Negative 07/21/2015 1118   NITRITE NEGATIVE 05/12/2015 1740   LEUKOCYTESUR Trace (A) 07/21/2015 1118    Pertinent Imaging: CT stone study  Assessment & Plan:    1. Nephrolithiasis - Schedule for bilateral URS - Urinalysis, Complete - CULTURE, URINE COMPREHENSIVE   No Follow-up on file.  Wilkie Aye, MD  [facility] [address], [address] [place], [place] [address] [phone]

## 2015-12-28 NOTE — Anesthesia Preprocedure Evaluation (Addendum)
Anesthesia Evaluation  Patient identified by MRN, date of birth, ID band Patient awake    Reviewed: Allergy & Precautions, NPO status , Patient's Chart, lab work & pertinent test results  History of Anesthesia Complications Negative for: history of anesthetic complications  Airway Mallampati: II       Dental   Pulmonary neg pulmonary ROS,           Cardiovascular negative cardio ROS       Neuro/Psych negative neurological ROS     GI/Hepatic negative GI ROS, Neg liver ROS,   Endo/Other  negative endocrine ROS  Renal/GU Renal disease (stones)     Musculoskeletal   Abdominal   Peds  Hematology  (+) anemia ,   Anesthesia Other Findings   Reproductive/Obstetrics                            Anesthesia Physical Anesthesia Plan  ASA: II  Anesthesia Plan: General   Post-op Pain Management:    Induction: Intravenous  Airway Management Planned: Oral ETT  Additional Equipment:   Intra-op Plan:   Post-operative Plan:   Informed Consent: I have reviewed the patients History and Physical, chart, labs and discussed the procedure including the risks, benefits and alternatives for the proposed anesthesia with the patient or authorized representative who has indicated his/her understanding and acceptance.     Plan Discussed with:   Anesthesia Plan Comments:        Anesthesia Quick Evaluation

## 2015-12-28 NOTE — Anesthesia Postprocedure Evaluation (Signed)
Anesthesia Post Note  Patient: Sherry Townsend  Procedure(s) Performed: Procedure(s) (LRB): CYSTOSCOPY WITH RETROGRADE PYELOGRAM (Left) CYSTOSCOPY WITH STENT PLACEMENT (Left) URETEROSCOPY WITH HOLMIUM LASER LITHOTRIPSY (Left)  Patient location during evaluation: PACU Anesthesia Type: General Level of consciousness: awake and alert Pain management: pain level controlled Vital Signs Assessment: post-procedure vital signs reviewed and stable Respiratory status: spontaneous breathing and respiratory function stable Cardiovascular status: stable Anesthetic complications: no    Last Vitals:  Vitals:   12/28/15 1343 12/28/15 1355  BP: 108/60 111/66  Pulse: 98 92  Resp: 18 16  Temp:  36.4 C    Last Pain:  Vitals:   12/28/15 1355  TempSrc: Oral  PainSc: 2                  Chandelle Harkey K

## 2015-12-28 NOTE — Transfer of Care (Signed)
Immediate Anesthesia Transfer of Care Note  Patient: Sherry Townsend  Procedure(s) Performed: Procedure(s): CYSTOSCOPY WITH RETROGRADE PYELOGRAM (Left) CYSTOSCOPY WITH STENT PLACEMENT (Left) URETEROSCOPY WITH HOLMIUM LASER LITHOTRIPSY (Left)  Patient Location: PACU  Anesthesia Type:General  Level of Consciousness: awake, alert , oriented and patient cooperative  Airway & Oxygen Therapy: Patient Spontanous Breathing and Patient connected to nasal cannula oxygen  Post-op Assessment: Report given to RN and Post -op Vital signs reviewed and stable  Post vital signs: Reviewed and stable  Last Vitals:  Vitals:   12/28/15 1016 12/28/15 1243  BP: 127/84 125/71  Pulse: 85 (!) 115  Resp: 16 13  Temp: (!) 35.6 C 36.9 C    Last Pain:  Vitals:   12/28/15 1016  TempSrc: Tympanic         Complications: No apparent anesthesia complications

## 2015-12-28 NOTE — Anesthesia Procedure Notes (Signed)
Procedure Name: Intubation Date/Time: 12/28/2015 10:52 AM Performed by: Shirlee LimerickMARION, Kiyra Slaubaugh Pre-anesthesia Checklist: Patient identified Patient Re-evaluated:Patient Re-evaluated prior to inductionOxygen Delivery Method: Circle system utilized Preoxygenation: Pre-oxygenation with 100% oxygen Intubation Type: IV induction Laryngoscope Size: Mac and 3 Grade View: Grade I Tube size: 7.0 mm Number of attempts: 1 Placement Confirmation: ETT inserted through vocal cords under direct vision,  positive ETCO2 and breath sounds checked- equal and bilateral Secured at: 21 cm Tube secured with: Tape Dental Injury: Teeth and Oropharynx as per pre-operative assessment

## 2015-12-28 NOTE — Interval H&P Note (Signed)
History and Physical Interval Note:  12/28/2015 10:28 AM  Derenda Barry DienesOwens  has presented today for surgery, with the diagnosis of bilateral calculi,left ureteral calculus  The various methods of treatment have been discussed with the patient and family. After consideration of risks, benefits and other options for treatment, the patient has consented to  Procedure(s): CYSTOSCOPY WITH RETROGRADE PYELOGRAM (Bilateral) STONE EXTRACTION WITH LASER (Bilateral) CYSTOSCOPY WITH STENT PLACEMENT (Bilateral) as a surgical intervention .  The patient's history has been reviewed, patient examined, no change in status, stable for surgery.  I have reviewed the patient's chart and labs.  Questions were answered to the patient's satisfaction.    RRR CTAB  Will focus on left stones today primarily, possible right.  No further left flank pain, presumably passed small ureteral calculus.    Vanna ScotlandAshley Celia Friedland

## 2015-12-28 NOTE — Op Note (Signed)
Date of procedure: 12/28/15  Preoperative diagnosis:  1. Left renal stone   Postoperative diagnosis:  1. Same as above   Procedure: 1. Left ureteroscopy 2. Laser lithotripsy 3. Basket extraction of Stone fragment 4. Left retrograde pyelogram 5. Left ureteral stent placement  Surgeon: Vanna Scotland, MD  Anesthesia: General  Complications: None  Intraoperative findings: Large 1.2 cm renal pelvis stone.  EBL: Minimal  Specimens: Stone fragment  Drains: 6 x 24 French double-J ureteral stent on left  Indication: Sherry Townsend is a 25 y.o. patient with history of left flank pain who underwent a CT scan revealing a 5 mm left UVJ stone as well as a 1.2 cm left nonobstructing stone at 3 mm right nonobstructing stone. Her pain on the left has subsequently subsided and she presents today for definitive management of her large left-sided stone. We did discuss in the periodically area possibly considering right ureteroscopy pending our progress on the left side..  After reviewing the management options for treatment, she elected to proceed with the above surgical procedure(s). We have discussed the potential benefits and risks of the procedure, side effects of the proposed treatment, the likelihood of the patient achieving the goals of the procedure, and any potential problems that might occur during the procedure or recuperation. Informed consent has been obtained.  Description of procedure:  The patient was taken to the operating room and general anesthesia was induced.  The patient was placed in the dorsal lithotomy position, prepped and draped in the usual sterile fashion, and preoperative antibiotics were administered. A preoperative time-out was performed.   A rigid 21 French cystoscope was advanced per urethra into the bladder. Attention was turned to the left ureteral orifice which was cannulated using a sensor wire up to level of the kidney. This snapped in place as a safety wire. A  semirigid ureteroscope was then advanced alongside the wire into the distal ureter and able to be advanced all the way to the proximal ureter. There was no ureteral stones or stone fragments identified. A Super Stiff wire was then advanced through the scope up to level of the kidney under fluoroscopic guidance leaving 2 wires in place. On scout imaging, a large 1.2 cm renal pelvic stone was appreciated. A Cook ureteral access sheath was then advanced the proximal ureter under fluoroscopic guidance without any difficulty. The safety wire was excluded from this. The inner cannula and Super Stiff wire removed. A 7 French flexible ureteroscope was then advanced through the access sheath up to level of the renal pelvis. The large stone was identified within the renal pelvis and pushed up into an upper pole calyx to help facilitate fragmentation. A 275  laser fiber was then brought in and using the laser settings of 0.2 J and 40 Hz, the stone was dusted. The stone dusted quite nicely into very small particles. This was done over the course of approximate 45 minutes The large size of the stone. Next, 1.9 [place] nitinol basket was used to extract all major stone fragments and a good amount of the dust.  Some of these fragments were sent off for stone analysis. The scope was then backed to level of the proximal ureter and contrast was injected through the scope to create a retrograde pyelogram roadmap of the kidney. This revealed no hydronephrosis or filling defects. The stone was no longer radiographic ally evident.  Each and every calyx was strictly visualized. There is no residual stones identified. The scope was then backed down the  length of the ureter inspecting along the way. There is no ureteral injury appreciated or any significant stone fragments. A 6 x 24 French double-J ureteral sent was then advanced over the safety wire up to level of the renal pelvis. Large partially drawn until full coil was noted within the  renal pelvis. The wires and fully withdrawn and a full coil was noted within the bladder. The bladder was then drained using an access sheath. Stent position was adequate. She was then cleaned and dried, repositioned the supine position, reversed from anesthesia, taken to the PACU in stable condition.  Plan: Patient will follow-up in 1 week for cystoscopy, stent removal.  Vanna ScotlandAshley Teena Mangus, M.D.

## 2015-12-28 NOTE — Anesthesia Postprocedure Evaluation (Signed)
Anesthesia Post Note  Patient: Sherry Townsend  Procedure(s) Performed: Procedure(s) (LRB): CYSTOSCOPY WITH RETROGRADE PYELOGRAM (Left) CYSTOSCOPY WITH STENT PLACEMENT (Left) URETEROSCOPY WITH HOLMIUM LASER LITHOTRIPSY (Left)  Anesthesia Post Evaluation  Last Vitals:  Vitals:   12/28/15 1343 12/28/15 1355  BP: 108/60 111/66  Pulse: 98 92  Resp: 18 16  Temp:  36.4 C    Last Pain:  Vitals:   12/28/15 1355  TempSrc: Oral  PainSc: 2                  Areanna Gengler K

## 2015-12-28 NOTE — Discharge Instructions (Signed)
AMBULATORY SURGERY  °DISCHARGE INSTRUCTIONS ° ° °1) The drugs that you were given will stay in your system until tomorrow so for the next 24 hours you should not: ° °A) Drive an automobile °B) Make any legal decisions °C) Drink any alcoholic beverage ° ° °2) You may resume regular meals tomorrow.  Today it is better to start with liquids and gradually work up to solid foods. ° °You may eat anything you prefer, but it is better to start with liquids, then soup and crackers, and gradually work up to solid foods. ° ° °3) Please notify your doctor immediately if you have any unusual bleeding, trouble breathing, redness and pain at the surgery site, drainage, fever, or pain not relieved by medication. ° ° ° °4) Additional Instructions: ° ° ° ° ° ° ° °Please contact your physician with any problems or Same Day Surgery at [phone], Monday through Friday 6 am to 4 pm, or  at [facility] number at [phone].You have a ureteral stent in place.  This is a tube that extends from your kidney to your bladder.  This may cause urinary bleeding, burning with urination, and urinary frequency.  Please call our office or present to the ED if you develop fevers >101 or pain which is not able to be controlled with oral pain medications.  You may be given either Flomax and/ or ditropan to help with bladder spasms and stent pain in addition to pain medications.   ° °[facility] °[address], [address] °[place], [place] [address] °[phone] ° °

## 2015-12-31 LAB — STONE ANALYSIS
CA OXALATE, DIHYDRATE: 30 %
CA OXALATE, MONOHYDR.: 50 %
CA PHOS CRY STONE QL IR: 20 %
Stone Weight KSTONE: 39 mg

## 2016-01-01 ENCOUNTER — Encounter: Payer: Self-pay | Admitting: Urology

## 2016-01-05 ENCOUNTER — Ambulatory Visit (INDEPENDENT_AMBULATORY_CARE_PROVIDER_SITE_OTHER): Payer: Managed Care, Other (non HMO) | Admitting: Urology

## 2016-01-05 VITALS — BP 127/80 | HR 86

## 2016-01-05 DIAGNOSIS — N200 Calculus of kidney: Secondary | ICD-10-CM | POA: Diagnosis not present

## 2016-01-05 LAB — URINALYSIS, COMPLETE
Bilirubin, UA: NEGATIVE
GLUCOSE, UA: NEGATIVE
Ketones, UA: NEGATIVE
NITRITE UA: NEGATIVE
Specific Gravity, UA: 1.02 (ref 1.005–1.030)
Urobilinogen, Ur: 0.2 mg/dL (ref 0.2–1.0)
pH, UA: 5.5 (ref 5.0–7.5)

## 2016-01-05 LAB — MICROSCOPIC EXAMINATION

## 2016-01-05 MED ORDER — CIPROFLOXACIN HCL 500 MG PO TABS
500.0000 mg | ORAL_TABLET | Freq: Once | ORAL | Status: AC
  Administered 2016-01-05: 500 mg via ORAL

## 2016-01-05 MED ORDER — LIDOCAINE HCL 2 % EX GEL
1.0000 "application " | Freq: Once | CUTANEOUS | Status: AC
  Administered 2016-01-05: 1 via URETHRAL

## 2016-01-05 NOTE — Progress Notes (Signed)
01/05/2016 6:04 AM   Sherry Townsend [birth year] [id number]  Referring provider: Nadara Mustard, MD [address] [place], [place] [address]  HPI:  1 - High Risk Nephrolithiasis - s/p left ureteroscoy for 12mm renal, 5mm UVJ stone 12/2015. Rt 3mm renal stone remains in situ. No prior stones. She is fertile female with desire for additional future pregnancies.   2 - Metabolic Stone Disease -  Eval 2017: BMP- normal; Composition - 80%CaOx, 20% CaPO4; 24 Hr Urines - pending   She works is [employer] in [employer] and analyzed her own stone!  Today "Sherry Townsend" is seen for cysto stent putll and f/u above. No interval fevers.    PMH: Past Medical History:  Diagnosis Date  . Anemia   . Kidney stone   . Kidney stone complicating pregnancy 05/14/2015    Surgical History: Past Surgical History:  Procedure Laterality Date  . CYSTOSCOPY W/ RETROGRADES Left 12/28/2015   Procedure: CYSTOSCOPY WITH RETROGRADE PYELOGRAM;  Surgeon: Vanna Scotland, MD;  Location: [facility];  Service: Urology;  Laterality: Left;  . CYSTOSCOPY WITH STENT PLACEMENT Left 12/28/2015   Procedure: CYSTOSCOPY WITH STENT PLACEMENT;  Surgeon: Vanna Scotland, MD;  Location: [facility];  Service: Urology;  Laterality: Left;  . URETEROSCOPY WITH HOLMIUM LASER LITHOTRIPSY Left 12/28/2015   Procedure: URETEROSCOPY WITH HOLMIUM LASER LITHOTRIPSY;  Surgeon: Vanna Scotland, MD;  Location: [facility];  Service: Urology;  Laterality: Left;  . WISDOM TOOTH EXTRACTION      Home Medications:    Medication List       Accurate as of 01/05/16  6:04 AM. Always use your most recent med list.          HYDROcodone-acetaminophen 5-325 MG tablet Commonly known as:  NORCO/VICODIN Take 1-2 tablets by mouth every 6 (six) hours as needed for moderate pain.   ibuprofen 100 MG/5ML suspension Commonly known as:  ADVIL,MOTRIN Take 30 mLs (600 mg total) by mouth every 6 (six) hours as needed for mild pain or moderate pain.   norethindrone  0.35 MG tablet Commonly known as:  MICRONOR,CAMILA,ERRIN Take 1 tablet by mouth daily.   oxybutynin 5 MG tablet Commonly known as:  DITROPAN Take 1 tablet (5 mg total) by mouth every 8 (eight) hours as needed for bladder spasms.   tamsulosin 0.4 MG Caps capsule Commonly known as:  FLOMAX Take 1 capsule (0.4 mg total) by mouth daily.       Allergies:  Allergies  Allergen Reactions  . Toradol [Ketorolac Tromethamine] Other (See Comments)    Numbness of legs    Family History: Family History  Problem Relation Age of Onset  . Nephrolithiasis Maternal Grandfather   . Nephrolithiasis Maternal Aunt   . Heart disease Maternal Grandmother     Social History:  reports that she has never smoked. She has never used smokeless tobacco. She reports that she drinks alcohol. She reports that she does not use drugs.   Review of Systems  Gastrointestinal (upper)  : Negative for upper GI symptoms  Gastrointestinal (lower) : Negative for lower GI symptoms  Constitutional : Negative for symptoms  Skin: Negative for skin symptoms  Eyes: Negative for eye symptoms  Ear/Nose/Throat : Negative for Ear/Nose/Throat symptoms  Hematologic/Lymphatic: Negative for Hematologic/Lymphatic symptoms  Cardiovascular : Negative for cardiovascular symptoms  Respiratory : Negative for respiratory symptoms  Endocrine: Negative for endocrine symptoms  Musculoskeletal: Negative for musculoskeletal symptoms  Neurological: Negative for neurological symptoms  Psychologic: Negative for psychiatric symptoms  Physical Exam: There were no vitals taken for this visit.  Constitutional:  Alert and oriented, No acute distress. HEENT: [place] AT, moist mucus membranes.  Trachea midline, no masses. Cardiovascular: No clubbing, cyanosis, or edema. Respiratory: Normal respiratory effort, no increased work of breathing. GI: Abdomen is soft, nontender, nondistended, no abdominal masses GU: No  CVA tenderness.  Skin: No rashes, bruises or suspicious lesions. Lymph: No cervical or inguinal adenopathy. Neurologic: Grossly intact, no focal deficits, moving all 4 extremities. Psychiatric: Normal mood and affect.  Laboratory Data: Lab Results  Component Value Date   WBC 25.1 (H) 09/21/2015   HGB 9.1 (L) 09/21/2015   HCT 26.9 (L) 09/21/2015   MCV 87.3 09/21/2015   PLT 190 09/21/2015    Lab Results  Component Value Date   CREATININE 0.90 05/12/2015    No results found for: PSA  No results found for: TESTOSTERONE  No results found for: HGBA1C  Urinalysis    Component Value Date/Time   COLORURINE YELLOW (A) 05/12/2015 1740   APPEARANCEUR Cloudy (A) 12/15/2015 0929   LABSPEC 1.031 (H) 05/12/2015 1740   PHURINE 6.0 05/12/2015 1740   GLUCOSEU Negative 12/15/2015 0929   HGBUR 2+ (A) 05/12/2015 1740   BILIRUBINUR Negative 12/15/2015 0929   KETONESUR 2+ (A) 05/12/2015 1740   PROTEINUR 1+ (A) 12/15/2015 0929   PROTEINUR 30 (A) 05/12/2015 1740   NITRITE Negative 12/15/2015 0929   NITRITE NEGATIVE 05/12/2015 1740   LEUKOCYTESUR 3+ (A) 12/15/2015 0929    Cystoscopy/ Stent removal procedure  Patient identification was confirmed, informed consent was obtained, and patient was prepped using Betadine solution.  Lidocaine jelly was administered per urethral meatus.    Preoperative abx where received prior to procedure.    Procedure: - Flexible cystoscope introduced, without any difficulty.   - Thorough search of the bladder revealed:    normal urethral meatus  Stent seen emanating from LEFT ureteral orifice, grasped with stent graspers, and removed in entirety.      Post-Procedure: - Patient tolerated the procedure well   Assessment & Plan:    1 - High Risk Nephrolithiasis -rec annual imaging surveillance with KUB, RUS x several and STRONGLY consider "pre-emptive" bilateral ureterocopy to stone free before future elective pregnancies.    2 - Metabolic Stone  Disease - Rec complete 24 hr urines and rediscuss. If unremarkable, would likely benefit from daily indapamide 1.25 given CaOx/CaPO4 composition.  RTC 6-8 weeks with 24 hr urines.   No Follow-up on file.  Sebastian AcheMANNY, Eara Burruel, MD  [facility] [address], [address] [place], [place] [address] [phone]

## 2016-01-23 ENCOUNTER — Other Ambulatory Visit: Payer: Self-pay | Admitting: Urology

## 2016-02-03 ENCOUNTER — Other Ambulatory Visit: Payer: Self-pay | Admitting: Urology

## 2016-02-15 ENCOUNTER — Encounter: Payer: Self-pay | Admitting: Urology

## 2016-02-15 ENCOUNTER — Ambulatory Visit (INDEPENDENT_AMBULATORY_CARE_PROVIDER_SITE_OTHER): Payer: Managed Care, Other (non HMO) | Admitting: Urology

## 2016-02-15 VITALS — BP 112/73 | HR 89 | Ht 64.0 in | Wt 129.4 lb

## 2016-02-15 DIAGNOSIS — N200 Calculus of kidney: Secondary | ICD-10-CM | POA: Diagnosis not present

## 2016-02-15 NOTE — Progress Notes (Signed)
02/15/2016 10:25 AM   Sherry Townsend 04/14/1990 [id number]  Referring provider: Nadara Mustardobert P Harris, MD [address] [place], [place] [address]  Chief Complaint  Patient presents with  . Follow-up    Nephrolithiasis    HPI: 1 - High Risk Nephrolithiasis - s/p left ureteroscoy for 12mm renal, 5mm UVJ stone 12/2015. Rt 3mm renal stone remains in situ. No prior stones. She is fertile female with desire for additional future pregnancies. Her left stent was removed 9/26.    2 - Metabolic Stone Disease -  Eval 2017: BMP- normal; Composition - 80%CaOx, 20% CaPO4; 24 Hr Urines - pending  She returns today to review 24-hour urine. Significant findings include low volume at 1.05 L, borderline urine pH of 6.4, supersaturation of CaOx, low magnesium.  Recommendations would include to increase fluid intake, urine output, magnesium supplementation and indapamide to reduce CaOx supersaturation.    PMH: Past Medical History:  Diagnosis Date  . Anemia   . Kidney stone   . Kidney stone complicating pregnancy 05/14/2015    Surgical History: Past Surgical History:  Procedure Laterality Date  . CYSTOSCOPY W/ RETROGRADES Left 12/28/2015   Procedure: CYSTOSCOPY WITH RETROGRADE PYELOGRAM;  Surgeon: Vanna ScotlandAshley Brandon, MD;  Location: [facility];  Service: Urology;  Laterality: Left;  . CYSTOSCOPY WITH STENT PLACEMENT Left 12/28/2015   Procedure: CYSTOSCOPY WITH STENT PLACEMENT;  Surgeon: Vanna ScotlandAshley Brandon, MD;  Location: [facility];  Service: Urology;  Laterality: Left;  . URETEROSCOPY WITH HOLMIUM LASER LITHOTRIPSY Left 12/28/2015   Procedure: URETEROSCOPY WITH HOLMIUM LASER LITHOTRIPSY;  Surgeon: Vanna ScotlandAshley Brandon, MD;  Location: [facility];  Service: Urology;  Laterality: Left;  . WISDOM TOOTH EXTRACTION      Home Medications:    Medication List       Accurate as of 02/15/16 10:25 AM. Always use your most recent med list.          ibuprofen 100 MG/5ML suspension Commonly known as:  ADVIL,MOTRIN Take  30 mLs (600 mg total) by mouth every 6 (six) hours as needed for mild pain or moderate pain.   norethindrone 0.35 MG tablet Commonly known as:  MICRONOR,CAMILA,ERRIN Take 1 tablet by mouth daily.       Allergies:  Allergies  Allergen Reactions  . Toradol [Ketorolac Tromethamine] Other (See Comments)    Numbness of legs    Family History: Family History  Problem Relation Age of Onset  . Nephrolithiasis Maternal Grandfather   . Nephrolithiasis Maternal Aunt   . Heart disease Maternal Grandmother     Social History:  reports that she has never smoked. She has never used smokeless tobacco. She reports that she drinks alcohol. She reports that she does not use drugs.  ROS: UROLOGY Frequent Urination?: No Hard to postpone urination?: No Burning/pain with urination?: No Get up at night to urinate?: No Leakage of urine?: No Urine stream starts and stops?: No Trouble starting stream?: No Do you have to strain to urinate?: No Blood in urine?: No Urinary tract infection?: No Sexually transmitted disease?: No Injury to kidneys or bladder?: No Painful intercourse?: No Weak stream?: No Currently pregnant?: No Vaginal bleeding?: No Last menstrual period?: No  Gastrointestinal Nausea?: No Vomiting?: No Indigestion/heartburn?: No Diarrhea?: No Constipation?: No  Constitutional Fever: No Night sweats?: No Weight loss?: No Fatigue?: No  Skin Skin rash/lesions?: No Itching?: No  Eyes Blurred vision?: No Double vision?: No  Ears/Nose/Throat Sore throat?: No Sinus problems?: No  Hematologic/Lymphatic Swollen glands?: No Easy bruising?: No  Cardiovascular Leg swelling?: No  Chest pain?: No  Respiratory Cough?: No Shortness of breath?: No  Endocrine Excessive thirst?: No  Musculoskeletal Back pain?: No Joint pain?: No  Neurological Headaches?: No Dizziness?: No  Psychologic Depression?: No Anxiety?: No  Physical Exam: BP 112/73   Pulse 89    Ht 5\' 4"  (1.626 m)   Wt 58.7 kg (129 lb 6.4 oz)   BMI 22.21 kg/m   Constitutional:  Alert and oriented, No acute distress. HEENT: [place] AT, moist mucus membranes.  Trachea midline, no masses. Cardiovascular: No clubbing, cyanosis, or edema. Respiratory: Normal respiratory effort, no increased work of breathing. GI: Abdomen is soft, nontender, nondistended, no abdominal masses Skin: No rashes, bruises or suspicious lesions. Lymph: No cervical or inguinal adenopathy. Neurologic: Grossly intact, no focal deficits, moving all 4 extremities. Psychiatric: Normal mood and affect.  Laboratory Data: Lab Results  Component Value Date   WBC 25.1 (H) 09/21/2015   HGB 9.1 (L) 09/21/2015   HCT 26.9 (L) 09/21/2015   MCV 87.3 09/21/2015   PLT 190 09/21/2015    Lab Results  Component Value Date   CREATININE 0.90 05/12/2015    No results found for: PSA  No results found for: TESTOSTERONE  No results found for: HGBA1C  Urinalysis    Component Value Date/Time   COLORURINE YELLOW (A) 05/12/2015 1740   APPEARANCEUR Cloudy (A) 01/05/2016 0840   LABSPEC 1.031 (H) 05/12/2015 1740   PHURINE 6.0 05/12/2015 1740   GLUCOSEU Negative 01/05/2016 0840   HGBUR 2+ (A) 05/12/2015 1740   BILIRUBINUR Negative 01/05/2016 0840   KETONESUR 2+ (A) 05/12/2015 1740   PROTEINUR 2+ (A) 01/05/2016 0840   PROTEINUR 30 (A) 05/12/2015 1740   NITRITE Negative 01/05/2016 0840   NITRITE NEGATIVE 05/12/2015 1740   LEUKOCYTESUR 1+ (A) 01/05/2016 0840    Pertinent Imaging:   Assessment & Plan:    1 - Metabolic Stone Disease - 24 hr urine reviewed with recs -- 1) daily indapamide 1.25 given CaOx/CaPO4 composition (she'll start when she stops breastfeeding), 2) nl Ca diet, 3) Mag supplement, 4) increase fluid intake, 5) low oxalate diet.   There are no diagnoses linked to this encounter.  No Follow-up on file.  Jerilee FieldESKRIDGE, Renia Mikelson, MD  [facility] [address], [address] [place], [place] [address] [phone]

## 2016-02-20 ENCOUNTER — Other Ambulatory Visit: Payer: Self-pay | Admitting: Urology

## 2016-05-16 ENCOUNTER — Ambulatory Visit: Payer: Managed Care, Other (non HMO)

## 2016-05-27 ENCOUNTER — Ambulatory Visit (INDEPENDENT_AMBULATORY_CARE_PROVIDER_SITE_OTHER): Payer: Managed Care, Other (non HMO) | Admitting: Urology

## 2016-05-27 ENCOUNTER — Encounter: Payer: Self-pay | Admitting: Urology

## 2016-05-27 VITALS — BP 104/66 | HR 85 | Ht 64.0 in | Wt 131.3 lb

## 2016-05-27 DIAGNOSIS — N200 Calculus of kidney: Secondary | ICD-10-CM

## 2016-05-27 MED ORDER — INDAPAMIDE 1.25 MG PO TABS: 1.2500 mg | ORAL_TABLET | Freq: Every day | ORAL | 11 refills | Status: DC

## 2016-05-27 NOTE — Progress Notes (Signed)
05/27/2016 3:14 PM   Shaka Barry Dienes 1990/06/02 [id number]  Referring provider: Nadara Mustard, MD [address] [place], [place] [address]  Chief Complaint  Patient presents with  . Follow-up    renal stone prevent    HPI: 1 - High Risk Nephrolithiasis- s/p left ureteroscoy for 12mm renal, 5mm UVJ stone 12/2015. Rt 3mm renal stone remains in situ. No prior stones. She is fertile female with desire for additional future pregnancies. Her left stent was removed 9/26.    2 - Metabolic Stone Disease-  Eval 8469: BMP- normal; Composition - 80%CaOx, 20% CaPO4;   24 Hr Urine study: Significant findings include low volume at 1.05 L, borderline urine pH of 6.4, supersaturation of CaOx, low magnesium.  Recommendations would include to increase fluid intake, urine output, magnesium supplementation and indapamide to reduce CaOx supersaturation.    At her last visit, she was advised to maintain a normal calcium diet, take magnesium supplementation, increased fluid intake, maintain low oxalate diet. She was also recommended to start indapamide as a thiazide diuretic due to high CaOx supersaturation but was told to hold this medication until she stops breast-feeding. She is currently breast-feeding but will likely be done within the next month. She also has been holding out and taking magnesium supplement to this. She has increased her fluid intake and urine output.   PMH: Past Medical History:  Diagnosis Date  . Anemia   . Kidney stone   . Kidney stone complicating pregnancy 05/14/2015    Surgical History: Past Surgical History:  Procedure Laterality Date  . CYSTOSCOPY W/ RETROGRADES Left 12/28/2015   Procedure: CYSTOSCOPY WITH RETROGRADE PYELOGRAM;  Surgeon: Vanna Scotland, MD;  Location: [facility];  Service: Urology;  Laterality: Left;  . CYSTOSCOPY WITH STENT PLACEMENT Left 12/28/2015   Procedure: CYSTOSCOPY WITH STENT PLACEMENT;  Surgeon: Vanna Scotland, MD;  Location: [facility];   Service: Urology;  Laterality: Left;  . URETEROSCOPY WITH HOLMIUM LASER LITHOTRIPSY Left 12/28/2015   Procedure: URETEROSCOPY WITH HOLMIUM LASER LITHOTRIPSY;  Surgeon: Vanna Scotland, MD;  Location: [facility];  Service: Urology;  Laterality: Left;  . WISDOM TOOTH EXTRACTION      Home Medications:  Allergies as of 05/27/2016      Reactions   Toradol [ketorolac Tromethamine] Other (See Comments)   Numbness of legs      Medication List       Accurate as of 05/27/16  3:14 PM. Always use your most recent med list.          ibuprofen 100 MG/5ML suspension Commonly known as:  ADVIL,MOTRIN Take 30 mLs (600 mg total) by mouth every 6 (six) hours as needed for mild pain or moderate pain.   indapamide 1.25 MG tablet Commonly known as:  LOZOL Take 1 tablet (1.25 mg total) by mouth daily.   levonorgestrel 20 MCG/24HR IUD Commonly known as:  MIRENA 1 each by Intrauterine route once.   norethindrone 0.35 MG tablet Commonly known as:  MICRONOR,CAMILA,ERRIN Take 1 tablet by mouth daily.       Allergies:  Allergies  Allergen Reactions  . Toradol [Ketorolac Tromethamine] Other (See Comments)    Numbness of legs    Family History: Family History  Problem Relation Age of Onset  . Nephrolithiasis Maternal Grandfather   . Nephrolithiasis Maternal Aunt   . Heart disease Maternal Grandmother     Social History:  reports that she has never smoked. She has never used smokeless tobacco. She reports that she drinks alcohol. She reports that  she does not use drugs.  ROS: UROLOGY Frequent Urination?: No Hard to postpone urination?: No Burning/pain with urination?: No Get up at night to urinate?: No Leakage of urine?: No Urine stream starts and stops?: No Trouble starting stream?: No Do you have to strain to urinate?: No Blood in urine?: No Urinary tract infection?: No Sexually transmitted disease?: No Injury to kidneys or bladder?: No Painful intercourse?: No Weak stream?:  No Currently pregnant?: No Vaginal bleeding?: No Last menstrual period?: n  Gastrointestinal Nausea?: No Vomiting?: No Indigestion/heartburn?: No Diarrhea?: No Constipation?: No  Constitutional Fever: No Night sweats?: No Weight loss?: No Fatigue?: No  Skin Skin rash/lesions?: No Itching?: No  Eyes Blurred vision?: No Double vision?: No  Ears/Nose/Throat Sore throat?: No Sinus problems?: No  Hematologic/Lymphatic Swollen glands?: No Easy bruising?: No  Cardiovascular Leg swelling?: No Chest pain?: No  Respiratory Cough?: No Shortness of breath?: No  Endocrine Excessive thirst?: No  Musculoskeletal Back pain?: No Joint pain?: No  Neurological Headaches?: No Dizziness?: No  Psychologic Depression?: No Anxiety?: No  Physical Exam: BP 104/66   Pulse 85   Ht 5\' 4"  (1.626 m)   Wt 131 lb 4.8 oz (59.6 kg)   BMI 22.54 kg/m   Constitutional:  Alert and oriented, No acute distress. HEENT: [place] AT, moist mucus membranes.  Trachea midline, no masses. Cardiovascular: No clubbing, cyanosis, or edema. Respiratory: Normal respiratory effort, no increased work of breathing. GI: Abdomen is soft, nontender, nondistended, no abdominal masses GU: No CVA tenderness.  Skin: No rashes, bruises or suspicious lesions. Lymph: No cervical or inguinal adenopathy. Neurologic: Grossly intact, no focal deficits, moving all 4 extremities. Psychiatric: Normal mood and affect.  Laboratory Data: Lab Results  Component Value Date   WBC 25.1 (H) 09/21/2015   HGB 9.1 (L) 09/21/2015   HCT 26.9 (L) 09/21/2015   MCV 87.3 09/21/2015   PLT 190 09/21/2015    Lab Results  Component Value Date   CREATININE 0.90 05/12/2015    No results found for: PSA  No results found for: TESTOSTERONE  No results found for: HGBA1C  Urinalysis    Component Value Date/Time   COLORURINE YELLOW (A) 05/12/2015 1740   APPEARANCEUR Cloudy (A) 01/05/2016 0840   LABSPEC 1.031 (H) 05/12/2015  1740   PHURINE 6.0 05/12/2015 1740   GLUCOSEU Negative 01/05/2016 0840   HGBUR 2+ (A) 05/12/2015 1740   BILIRUBINUR Negative 01/05/2016 0840   KETONESUR 2+ (A) 05/12/2015 1740   PROTEINUR 2+ (A) 01/05/2016 0840   PROTEINUR 30 (A) 05/12/2015 1740   NITRITE Negative 01/05/2016 0840   NITRITE NEGATIVE 05/12/2015 1740   LEUKOCYTESUR 1+ (A) 01/05/2016 0840     Assessment & Plan:    1 - Metabolic Stone Disease- 24 hr urine reviewed with recs -- 1) daily indapamide 1.25 given CaOx/CaPO4 composition (sent to pharmacy. Will start when she finishes breastfeeding), 2) nl Ca diet, 3) Mag supplement (will start when she finishes breast feeding), 4) increase fluid intake, 5) low oxalate diet. Gave her ABCs of stone prevention booklet -follow up in 6 months with 24 hr urine study prior to evaluate effectiveness of stone prevention therapy  2. Right 3 mm renal stone - check KUB in one year to monitor this stone as well as recurrent stones  Return in about 6 months (around 11/24/2016) for with 24 hr urine study prior.  Hildred LaserBrian James Taytum Wheller, MD  [facility] [address], [address] [place], [place] [id number] [phone]

## 2016-12-01 ENCOUNTER — Ambulatory Visit: Payer: Managed Care, Other (non HMO)

## 2017-12-14 IMAGING — US US RENAL
1 series · 14 of 25 positions shown · non-contrast
Comparison: 09/12/2014 CT

CLINICAL DATA: Right flank pain.  Kidney stone

EXAM:
RENAL / URINARY TRACT ULTRASOUND COMPLETE

[Series 1: us renal · 0.23mm/px · 14 of 35 slices shown]
[im 1/35]
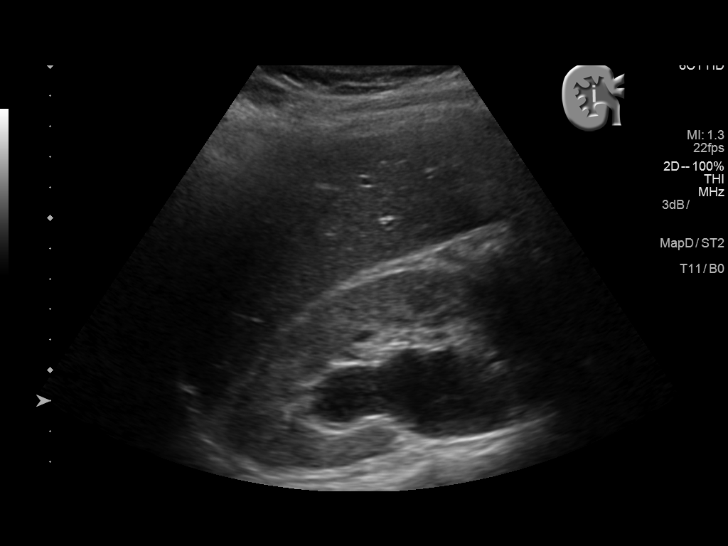
[im 3/35]
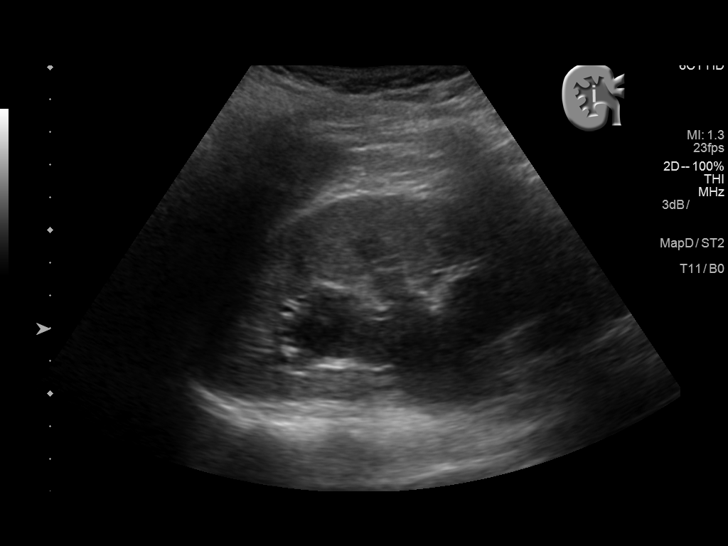
[im 6/35]
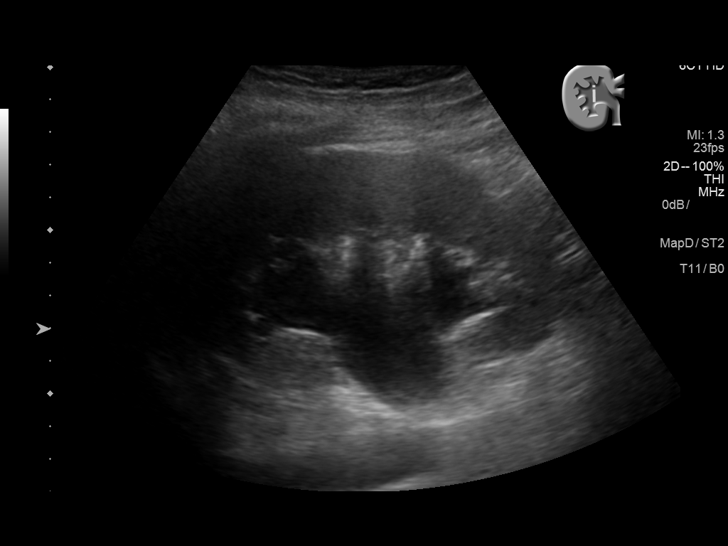
[im 9/35]
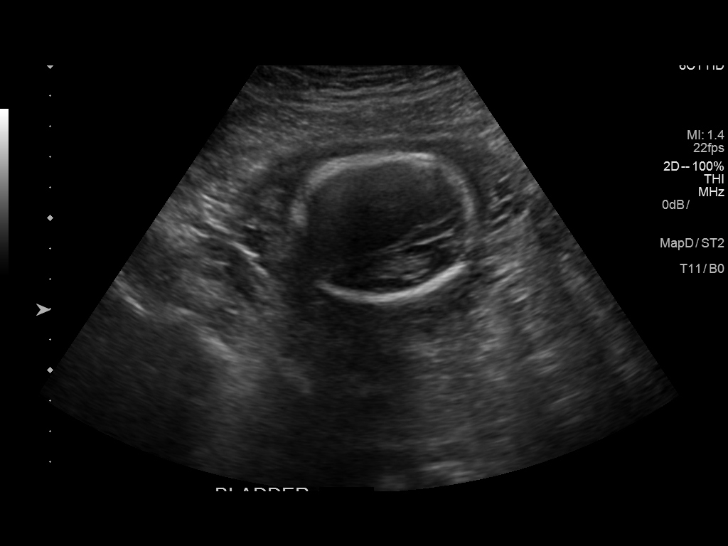
[im 12/35]
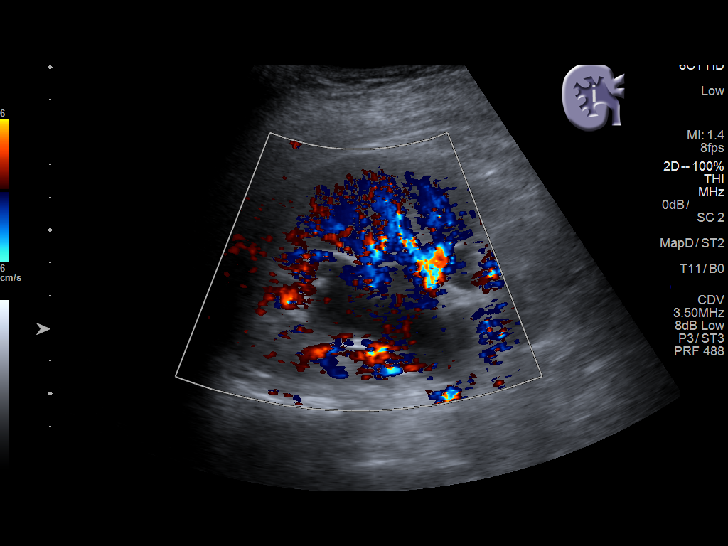
[im 13/35]
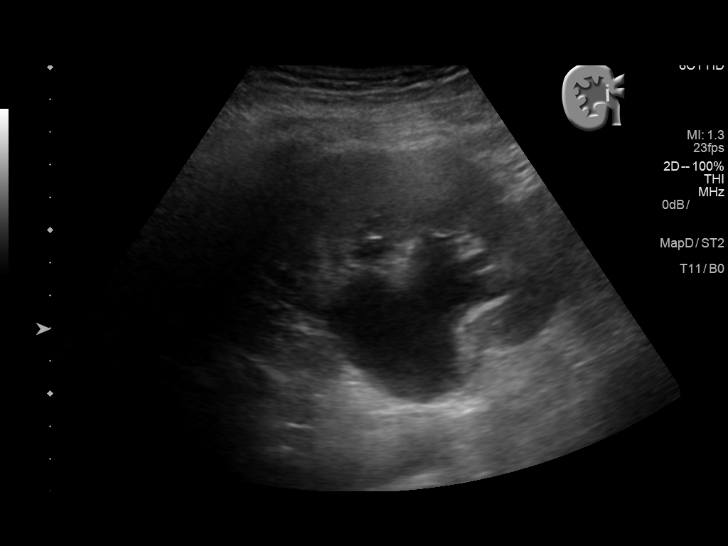
[im 16/35]
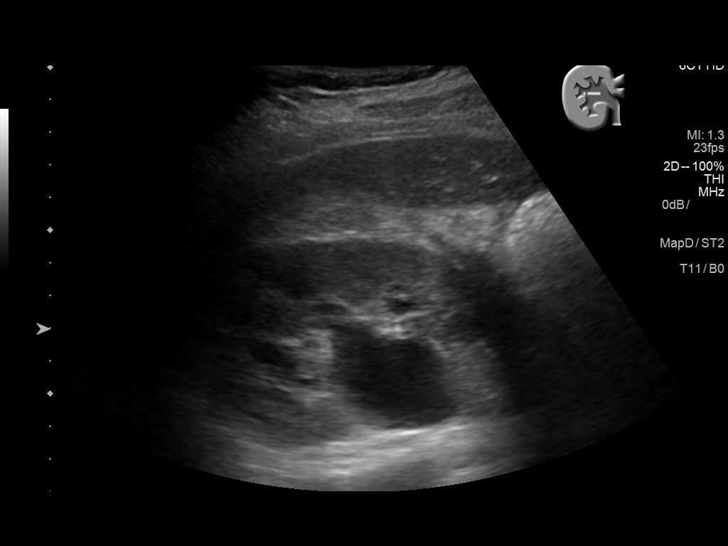
[im 19/35]
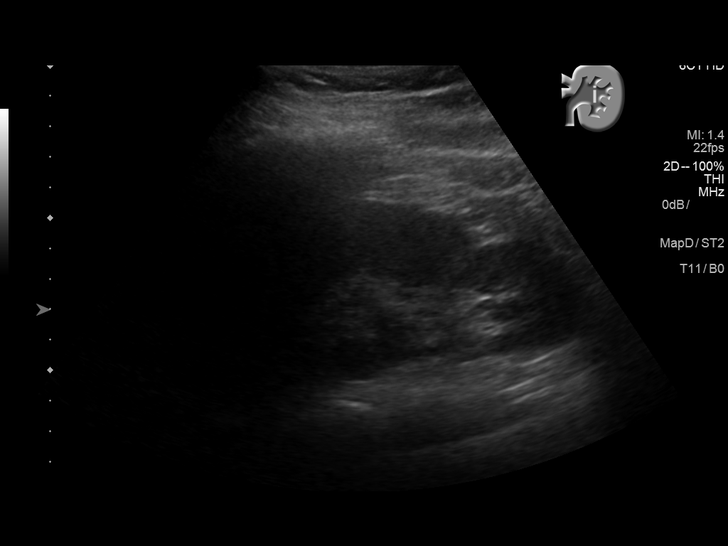
[im 22/35]
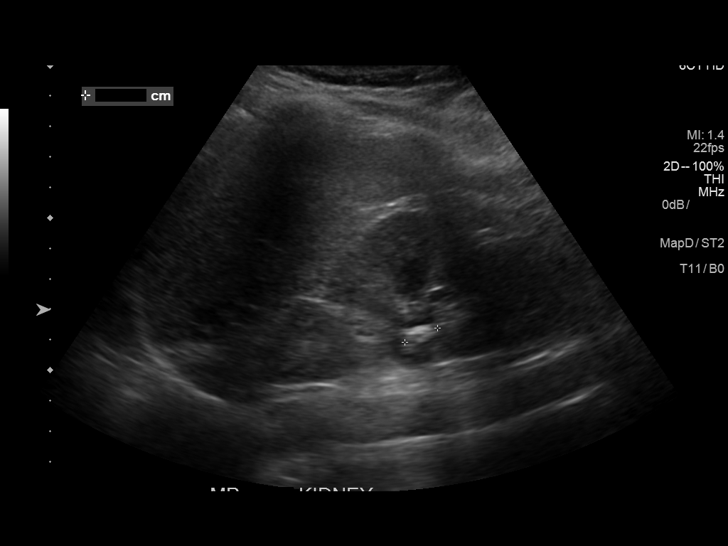
[im 23/35]
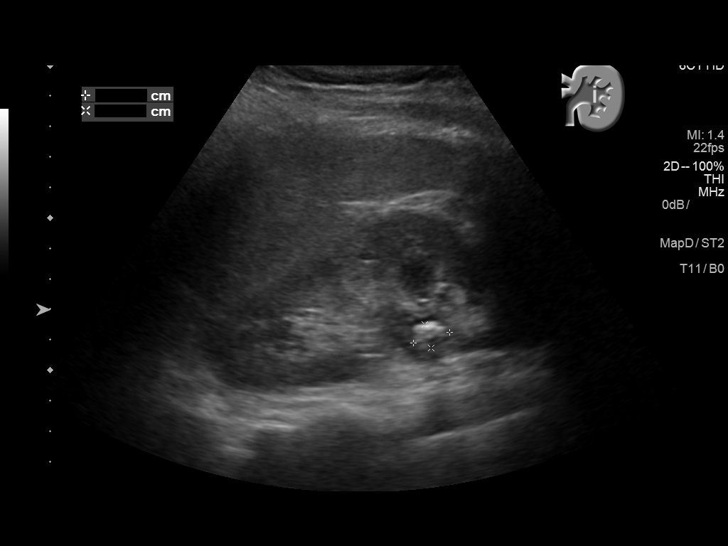
[im 26/35]
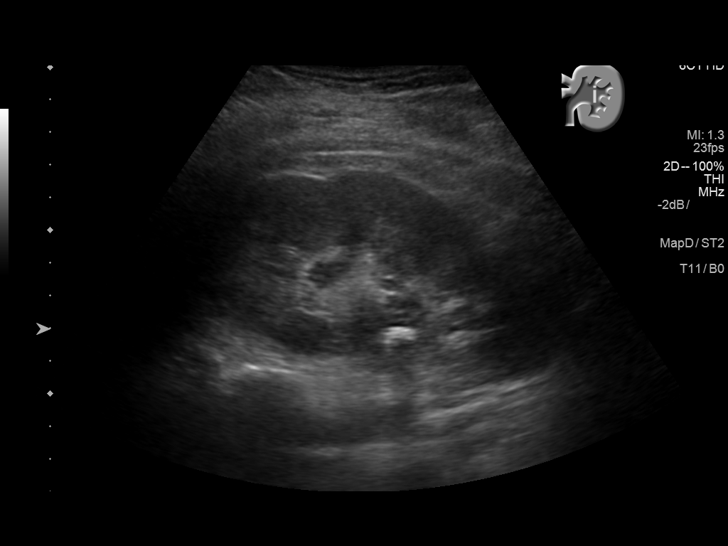
[im 29/35]
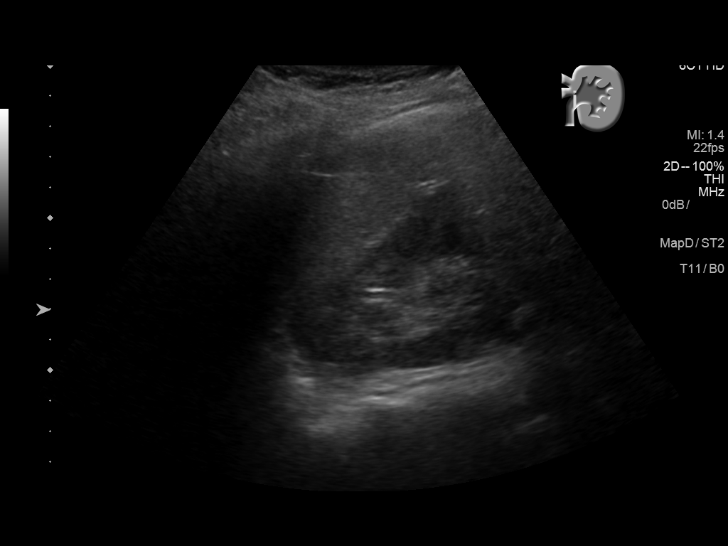
[im 32/35]
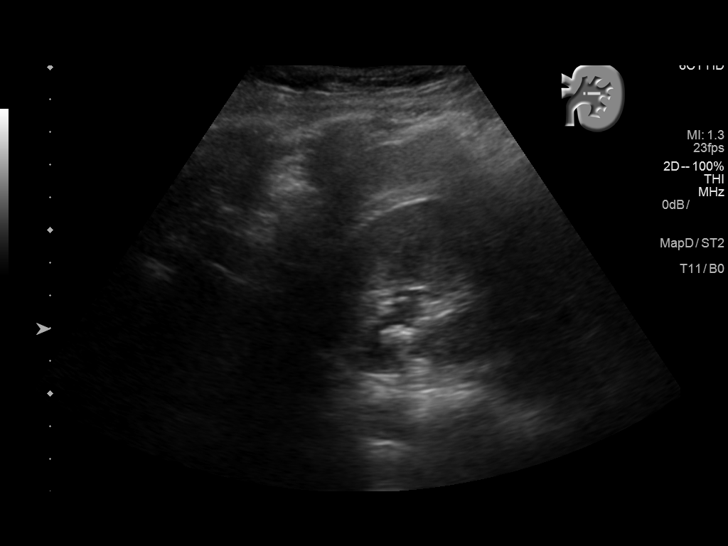
[im 35/35]
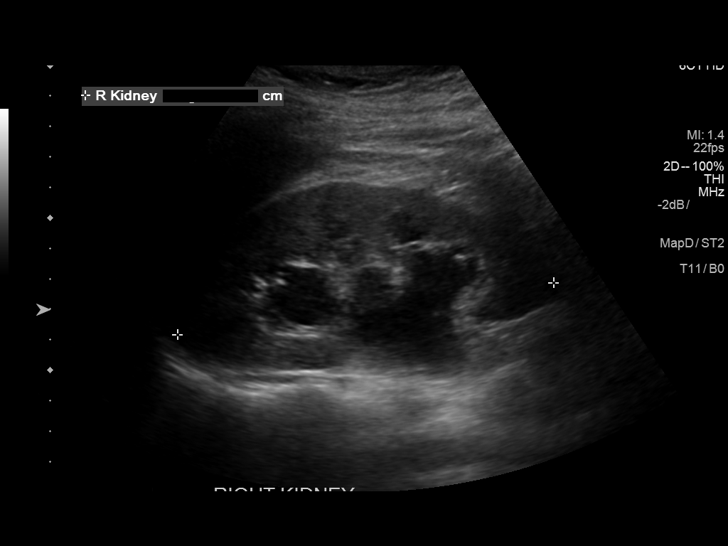

[14 of 25 positions shown; findings below may reference images not displayed]

FINDINGS: Right Kidney:

Length: 12 cm. High-grade hydronephrosis without visualized
obstruction. The bladder is empty and ureteral jets could not be
evaluated. Upper pole stone measuring 6 mm

Left Kidney:

Length: 11 cm. Echogenicity within normal limits. No mass or
hydronephrosis visualized. 12 mm pelvic calculus.

Bladder:

Completely decompressed.  Gravid uterus
IMPRESSION: 1. High-grade right hydronephrosis. The bladder is empty and
presence of ureteral jets could not be established.
2. Bilateral nephrolithiasis.

## 2018-03-27 IMAGING — US US RENAL
1 series · 14 of 25 positions shown · non-contrast
Comparison: 07/01/2015.

CLINICAL DATA: Nephrolithiasis.  Pregnancy.

EXAM:
RENAL / URINARY TRACT ULTRASOUND COMPLETE

[Series 1: us renal · 0.23mm/px · 14 of 56 slices shown]
[im 1/56]
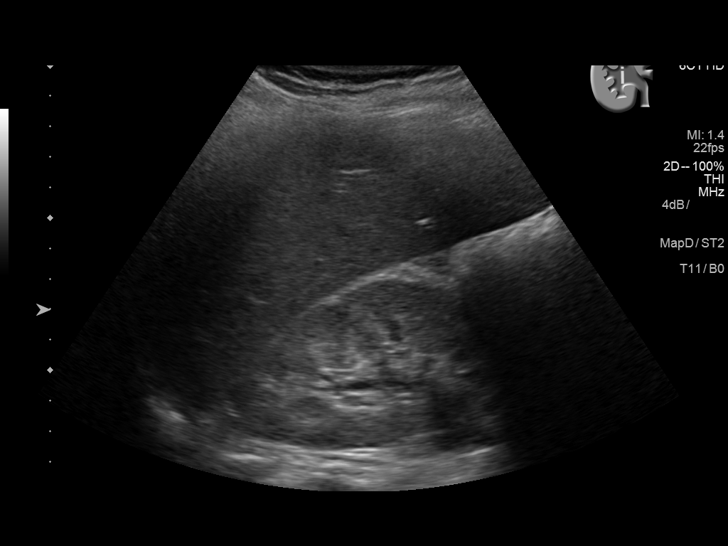
[im 5/56]
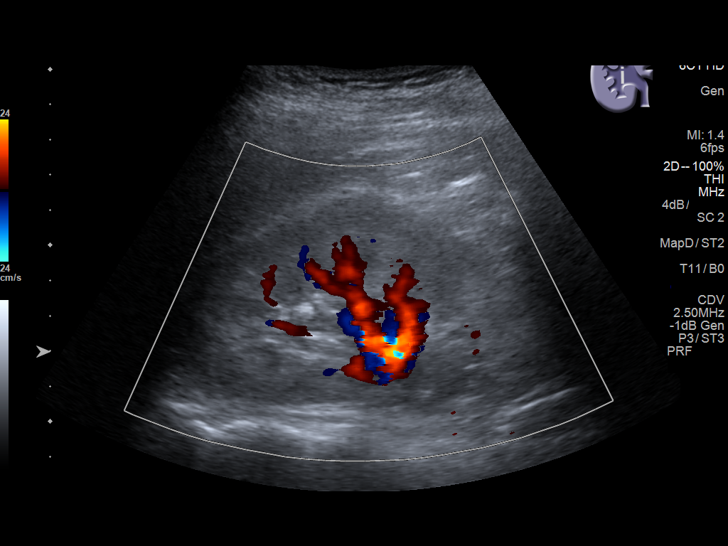
[im 10/56]
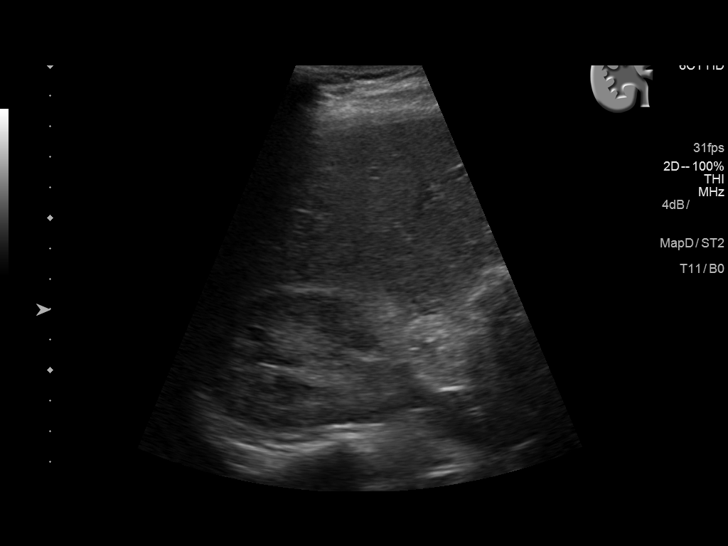
[im 14/56]
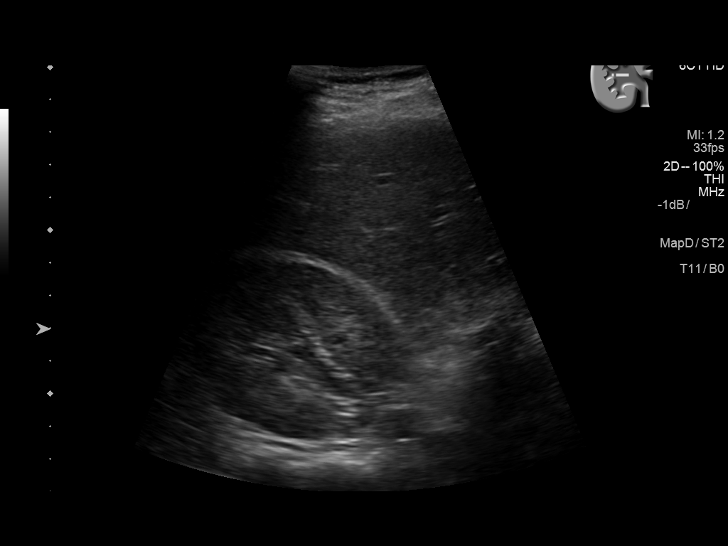
[im 19/56]
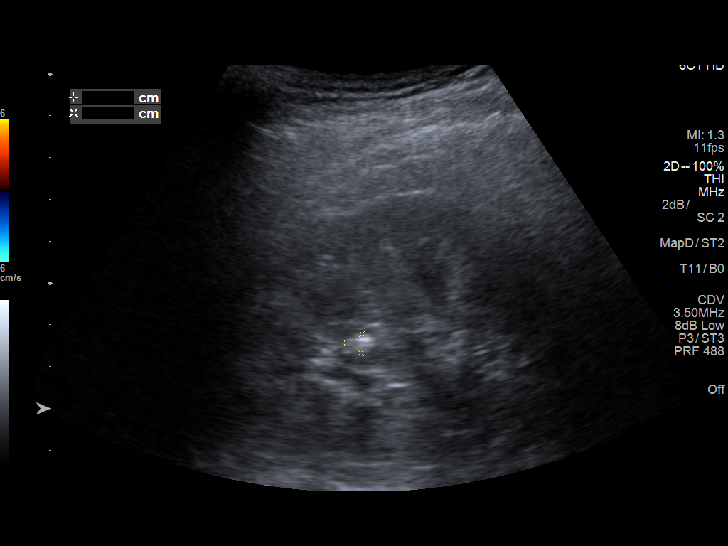
[im 21/56]
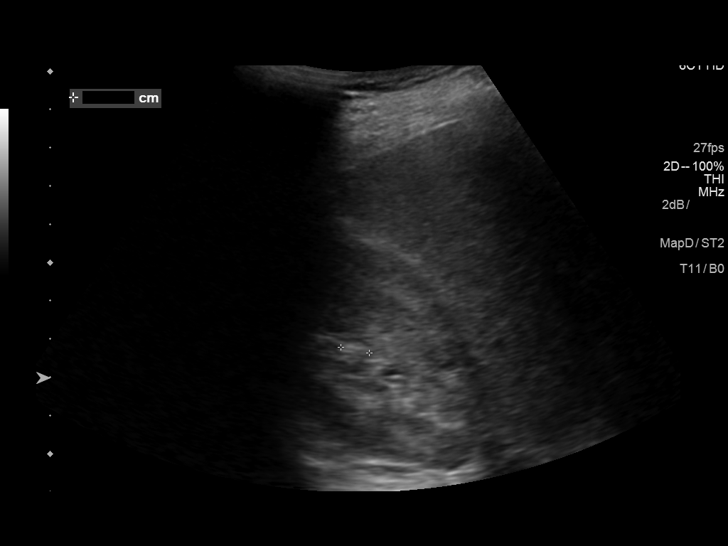
[im 26/56]
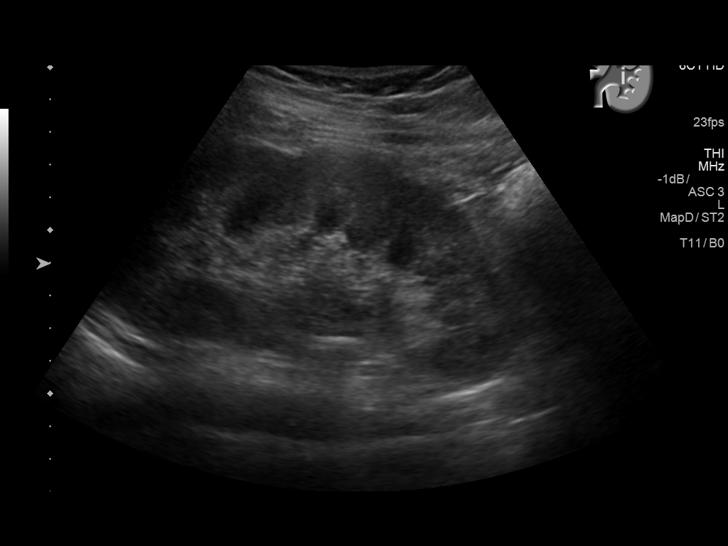
[im 30/56]
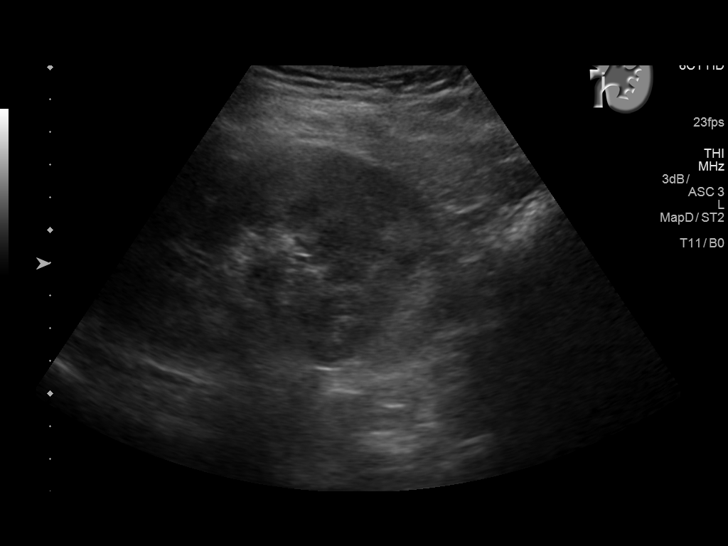
[im 35/56]
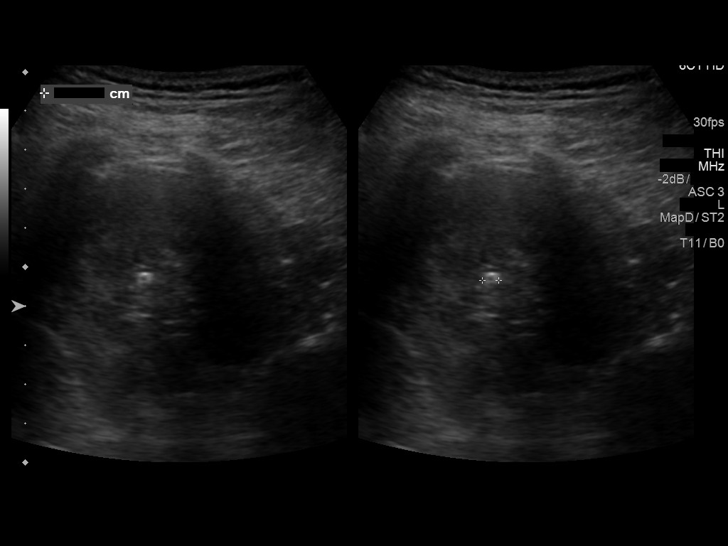
[im 37/56]
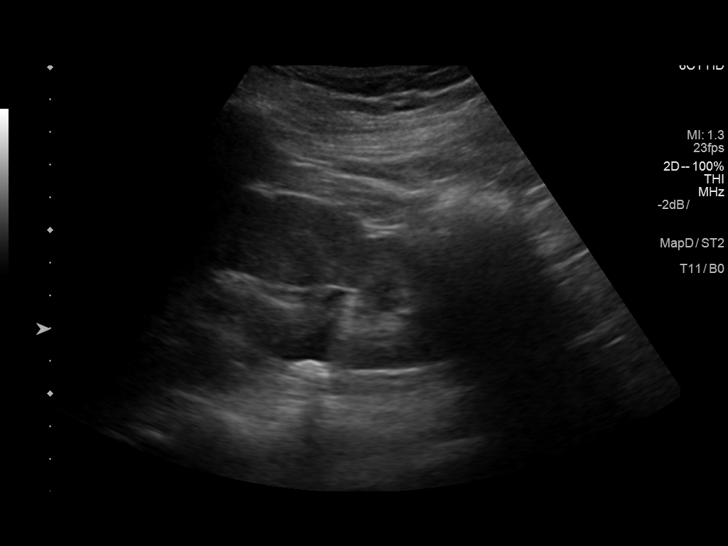
[im 42/56]
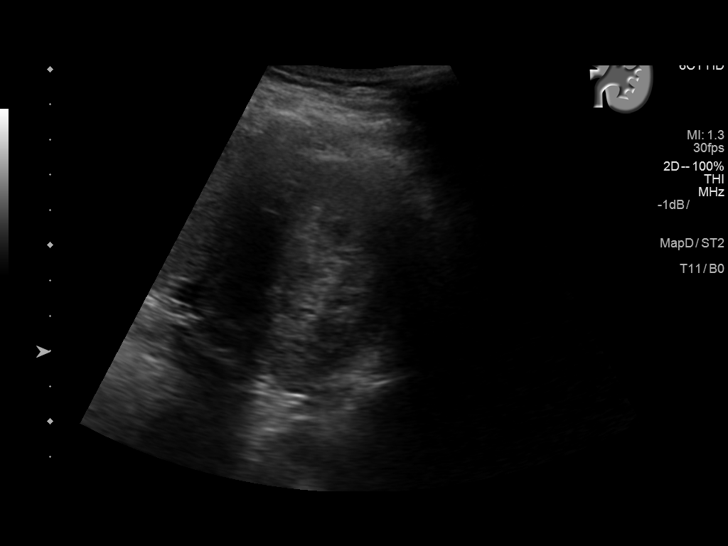
[im 46/56]
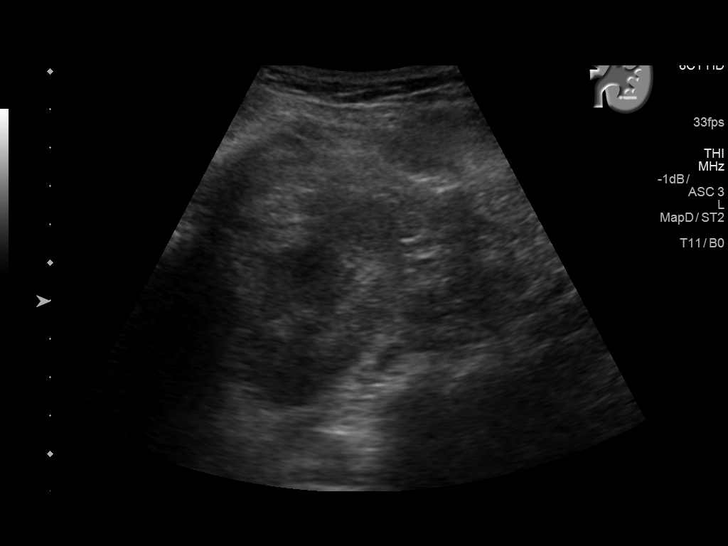
[im 51/56]
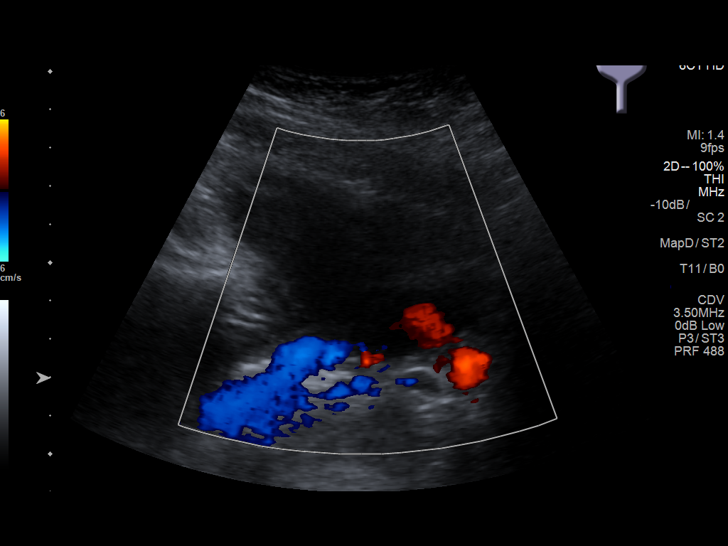
[im 56/56]
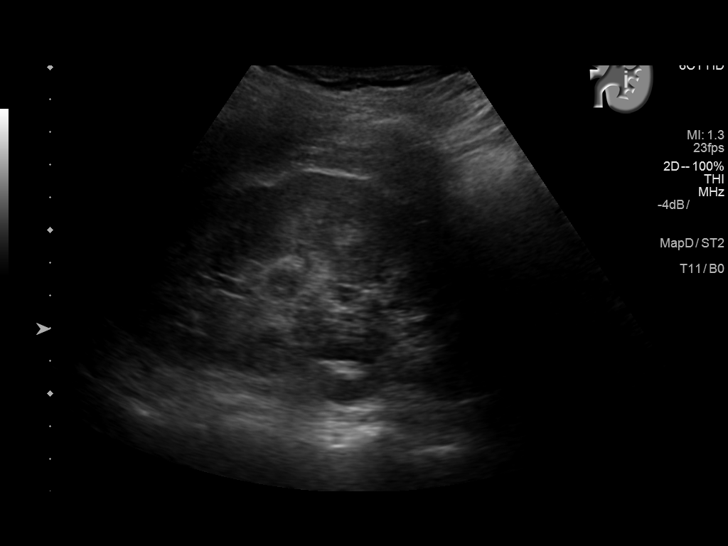

[14 of 25 positions shown; findings below may reference images not displayed]

FINDINGS: Right Kidney:

Length: 11.7 cm. No mass. 7 mm upper pole calyx. Mild
hydronephrosis.

Left Kidney:

Length: 13.3 cm. Echogen No mass. 5 mm upper pole calyx stone.
cm renal pelvic stone. Mild hydronephrosis.

Bladder:

Appears normal for degree of bladder distention.
IMPRESSION: 1. Bilateral nephrolithiasis. A 1.3 cm stone is noted a right renal
pelvis/ UPJ.

2. Mild bilateral hydronephrosis.

Similar findings noted on prior exam.

## 2019-05-27 ENCOUNTER — Other Ambulatory Visit: Payer: Self-pay

## 2019-05-27 ENCOUNTER — Encounter: Payer: Self-pay | Admitting: Obstetrics and Gynecology

## 2019-05-27 ENCOUNTER — Ambulatory Visit (INDEPENDENT_AMBULATORY_CARE_PROVIDER_SITE_OTHER): Payer: Managed Care, Other (non HMO) | Admitting: Obstetrics and Gynecology

## 2019-05-27 VITALS — BP 112/80 | Ht 64.0 in | Wt 178.0 lb

## 2019-05-27 DIAGNOSIS — Z124 Encounter for screening for malignant neoplasm of cervix: Secondary | ICD-10-CM

## 2019-05-27 DIAGNOSIS — Z01419 Encounter for gynecological examination (general) (routine) without abnormal findings: Secondary | ICD-10-CM | POA: Diagnosis not present

## 2019-05-27 DIAGNOSIS — Z3169 Encounter for other general counseling and advice on procreation: Secondary | ICD-10-CM

## 2019-05-27 DIAGNOSIS — Z23 Encounter for immunization: Secondary | ICD-10-CM | POA: Diagnosis not present

## 2019-05-27 DIAGNOSIS — Z30432 Encounter for removal of intrauterine contraceptive device: Secondary | ICD-10-CM

## 2019-05-27 NOTE — Addendum Note (Signed)
Addended by: Donnetta Hail on: 05/27/2019 03:45 PM   Modules accepted: Orders

## 2019-05-27 NOTE — Patient Instructions (Signed)
I value your feedback and entrusting us with your care. If you get a [facility] patient survey, I would appreciate you taking the time to let us know about your experience today. Thank you!  As of March 21, 2019, your lab results will be released to your MyChart immediately, before I even have a chance to see them. Please give me time to review them and contact you if there are any abnormalities. Thank you for your patience.  

## 2019-05-27 NOTE — Progress Notes (Signed)
PCP:  Gae Dry, MD   Chief Complaint  Patient presents with  . Gynecologic Exam  . IUD removal    plans to conceive  . [employer] employee     HPI:      [name] is a 29 y.o. G1P1001 who LMP was No LMP recorded. (Menstrual status: IUD)., presents today for her annual examination.  Her menses are absent with IUD. No BTB, no dysmen.   Sex activity: single partner, contraception - IUD. Mirena placed 04/18/16; wants removed for conception. Taking MVI. Last Pap: March 29, 2016  Results were: no abnormalities   There is no FH of breast cancer. There is no FH of ovarian cancer. The patient does not do self-breast exams.  Tobacco use: The patient denies current or previous tobacco use. Alcohol use: social drinker No drug use.  Exercise: moderately active  She does get adequate calcium and Vitamin D in her diet.   Past Medical History:  Diagnosis Date  . Anemia   . Kidney stone   . Kidney stone complicating pregnancy 06/13/3297    Past Surgical History:  Procedure Laterality Date  . CYSTOSCOPY W/ RETROGRADES Left 12/28/2015   Procedure: CYSTOSCOPY WITH RETROGRADE PYELOGRAM;  Surgeon: Hollice Espy, MD;  Location: [facility];  Service: Urology;  Laterality: Left;  . CYSTOSCOPY WITH STENT PLACEMENT Left 12/28/2015   Procedure: CYSTOSCOPY WITH STENT PLACEMENT;  Surgeon: Hollice Espy, MD;  Location: [facility];  Service: Urology;  Laterality: Left;  . URETEROSCOPY WITH HOLMIUM LASER LITHOTRIPSY Left 12/28/2015   Procedure: URETEROSCOPY WITH HOLMIUM LASER LITHOTRIPSY;  Surgeon: Hollice Espy, MD;  Location: [facility];  Service: Urology;  Laterality: Left;  . WISDOM TOOTH EXTRACTION      Family History  Problem Relation Age of Onset  . Nephrolithiasis Maternal Grandfather   . Nephrolithiasis Maternal Aunt   . Heart disease Maternal Grandmother   . Bladder Cancer Paternal Grandmother     Social History   Socioeconomic History  . Marital status: Married   Spouse name: Not on file  . Number of children: Not on file  . Years of education: Not on file  . Highest education level: Not on file  Occupational History  . Not on file  Tobacco Use  . Smoking status: Never Smoker  . Smokeless tobacco: Never Used  Substance and Sexual Activity  . Alcohol use: Yes    Alcohol/week: 0.0 standard drinks    Comment: OCC  . Drug use: No  . Sexual activity: Yes    Birth control/protection: I.U.D.    Comment: Mirena  Other Topics Concern  . Not on file  Social History Narrative  . Not on file   Social Determinants of Health   Financial Resource Strain:   . Difficulty of Paying Living Expenses: Not on file  Food Insecurity:   . Worried About Charity fundraiser in the Last Year: Not on file  . Ran Out of Food in the Last Year: Not on file  Transportation Needs:   . Lack of Transportation (Medical): Not on file  . Lack of Transportation (Non-Medical): Not on file  Physical Activity:   . Days of Exercise per Week: Not on file  . Minutes of Exercise per Session: Not on file  Stress:   . Feeling of Stress : Not on file  Social Connections:   . Frequency of Communication with Friends and Family: Not on file  . Frequency of Social Gatherings with Friends and Family: Not on  file  . Attends Religious Services: Not on file  . Active Member of Clubs or Organizations: Not on file  . Attends Banker Meetings: Not on file  . Marital Status: Not on file  Intimate Partner Violence:   . Fear of Current or Ex-Partner: Not on file  . Emotionally Abused: Not on file  . Physically Abused: Not on file  . Sexually Abused: Not on file    No current outpatient medications on file.     ROS:  Review of Systems  Constitutional: Negative for fatigue, fever and unexpected weight change.  Respiratory: Negative for cough, shortness of breath and wheezing.   Cardiovascular: Negative for chest pain, palpitations and leg swelling.   Gastrointestinal: Negative for blood in stool, constipation, diarrhea, nausea and vomiting.  Endocrine: Negative for cold intolerance, heat intolerance and polyuria.  Genitourinary: Negative for dyspareunia, dysuria, flank pain, frequency, genital sores, hematuria, menstrual problem, pelvic pain, urgency, vaginal bleeding, vaginal discharge and vaginal pain.  Musculoskeletal: Negative for back pain, joint swelling and myalgias.  Skin: Negative for rash.  Neurological: Negative for dizziness, syncope, light-headedness, numbness and headaches.  Hematological: Negative for adenopathy.  Psychiatric/Behavioral: Negative for agitation, confusion, sleep disturbance and suicidal ideas. The patient is not nervous/anxious.   BREAST: No symptoms   Objective: BP 112/80   Ht 5\' 4"  (1.626 m)   Wt 178 lb (80.7 kg)   Breastfeeding No   BMI 30.55 kg/m    Physical Exam Constitutional:      Appearance: She is well-developed.  Genitourinary:     Vulva, vagina, cervix, uterus, right adnexa and left adnexa normal.     No vulval lesion or tenderness noted.        No vaginal discharge, erythema or tenderness.     No cervical polyp.     IUD strings visualized.     Uterus is not enlarged or tender.     No right or left adnexal mass present.     Right adnexa not tender.     Left adnexa not tender.  Neck:     Thyroid: No thyromegaly.  Cardiovascular:     Rate and Rhythm: Normal rate and regular rhythm.     Heart sounds: Normal heart sounds. No murmur.  Pulmonary:     Effort: Pulmonary effort is normal.     Breath sounds: Normal breath sounds.  Chest:     Breasts:        Right: No mass, nipple discharge, skin change or tenderness.        Left: No mass, nipple discharge, skin change or tenderness.  Abdominal:     Palpations: Abdomen is soft.     Tenderness: There is no abdominal tenderness. There is no guarding.  Musculoskeletal:        General: Normal range of motion.     Cervical back:  Normal range of motion.  Neurological:     General: No focal deficit present.     Mental Status: She is alert and oriented to person, place, and time.     Cranial Nerves: No cranial nerve deficit.  Skin:    General: Skin is warm and dry.  Psychiatric:        Mood and Affect: Mood normal.        Behavior: Behavior normal.        Thought Content: Thought content normal.        Judgment: Judgment normal.  Vitals reviewed.    IUD Removal Strings of IUD  identified and grasped.  IUD removed without problem with [place] forceps.  Pt tolerated this well.  IUD noted to be intact.   Assessment/Plan: Encounter for annual routine gynecological examination  Cervical cancer screening - Plan: Cytology - PAP  Encounter for removal of intrauterine contraceptive device (IUD); pt tolerated well.  Pre-conception counseling--start PNVs, IUD removed. F/u prn NOB.          GYN counsel adequate intake of calcium and vitamin D, diet and exercise     F/U  Return in about 1 year (around 05/26/2020).  Courtni Balash B. Lihanna Biever, PA-C 05/27/2019 3:32 PM

## 2019-05-28 ENCOUNTER — Telehealth: Payer: Self-pay

## 2019-05-28 DIAGNOSIS — N200 Calculus of kidney: Secondary | ICD-10-CM

## 2019-05-28 NOTE — Telephone Encounter (Signed)
Patient called today wanting to know if she should make an appointment for follow up on her kidney stone history. She is not currently passing any but is thinking of becoming pregnant again and was told last time that before she does she should contact us for follow up. Patient was made an appointment to re-establish care since it has been 3years since she was seen here

## 2019-05-29 LAB — IGP, RFX APTIMA HPV ASCU

## 2019-05-29 NOTE — Addendum Note (Signed)
Addended by: Martha Clan on: 05/29/2019 02:24 PM   Modules accepted: Orders

## 2019-06-20 ENCOUNTER — Other Ambulatory Visit: Payer: Self-pay

## 2019-06-20 ENCOUNTER — Ambulatory Visit: Payer: Managed Care, Other (non HMO) | Admitting: Urology

## 2019-06-20 ENCOUNTER — Ambulatory Visit
Admission: RE | Admit: 2019-06-20 | Discharge: 2019-06-20 | Disposition: A | Discharge status: Home / Self Care | Payer: Managed Care, Other (non HMO) | Attending: Urology | Admitting: Urology

## 2019-06-20 ENCOUNTER — Ambulatory Visit
Admission: RE | Admit: 2019-06-20 | Discharge: 2019-06-20 | Disposition: A | Discharge status: Home / Self Care | Payer: Managed Care, Other (non HMO) | Source: Ambulatory Visit | Attending: Urology | Admitting: Urology

## 2019-06-20 ENCOUNTER — Encounter: Payer: Self-pay | Admitting: Urology

## 2019-06-20 VITALS — BP 113/77 | HR 99 | Ht 64.0 in | Wt 175.0 lb

## 2019-06-20 DIAGNOSIS — N200 Calculus of kidney: Secondary | ICD-10-CM

## 2019-06-20 LAB — URINALYSIS, COMPLETE
Bilirubin, UA: NEGATIVE
Glucose, UA: NEGATIVE
Ketones, UA: NEGATIVE
Leukocytes,UA: NEGATIVE
Nitrite, UA: NEGATIVE
Protein,UA: NEGATIVE
RBC, UA: NEGATIVE
Specific Gravity, UA: 1.02 (ref 1.005–1.030)
Urobilinogen, Ur: 0.2 mg/dL (ref 0.2–1.0)
pH, UA: 7.5 (ref 5.0–7.5)

## 2019-06-20 LAB — MICROSCOPIC EXAMINATION: RBC: NONE SEEN /hpf (ref 0–2)

## 2019-06-20 NOTE — Progress Notes (Signed)
06/20/2019 12:24 PM   Sherry Townsend 1990-07-28 [id number]  PCP: Nadara Mustard, MD [address] [place],  [place] [address]  Chief Complaint  Patient presents with  . Nephrolithiasis    HPI: Sherry Townsend is a 29 y.o. female who presents to reestablish care. She had a CT scan in June 2016 which showed a 5 mm nonobstructing right renal calculus and a 7 mm nonobstructing midpole calculus.  She became pregnant and early 2017 and developed flank pain.  Renal ultrasound showed a 12 mm left renal pelvic calculus and a 6 mm right upper pole calculus.  She did have high-grade right hydronephrosis though no definite obstructing stone. After delivery a follow-up CT showed a 5 mm left UVJ stone, 13 mm left renal pelvic calculus and 3 mm right upper pole calculus.  There was moderate left hydronephrosis/hydroureter but no right hydronephrosis.  She underwent ureteroscopic removal of her left sided calculi by Dr. Apolinar Junes September 2017.  Stone was 80% CaOx/20% CaPO4.  24-hour urine study showed low urine volume at 1 L and low magnesium.  She presents today for follow-up as she is planning a second pregnancy in the near future.  She denies flank or abdominal pain.  No bothersome lower urinary tract symptoms.  Denies gross hematuria.  KUB performed today was reviewed and there is a 3 mm right upper pole renal calculus that was previously identified on prior CT.   PMH: Past Medical History:  Diagnosis Date  . Anemia   . Kidney stone   . Kidney stone complicating pregnancy 05/14/2015    Surgical History: Past Surgical History:  Procedure Laterality Date  . CYSTOSCOPY W/ RETROGRADES Left 12/28/2015   Procedure: CYSTOSCOPY WITH RETROGRADE PYELOGRAM;  Surgeon: Vanna Scotland, MD;  Location: [facility];  Service: Urology;  Laterality: Left;  . CYSTOSCOPY WITH STENT PLACEMENT Left 12/28/2015   Procedure: CYSTOSCOPY WITH STENT PLACEMENT;  Surgeon: Vanna Scotland, MD;  Location: [facility];   Service: Urology;  Laterality: Left;  . URETEROSCOPY WITH HOLMIUM LASER LITHOTRIPSY Left 12/28/2015   Procedure: URETEROSCOPY WITH HOLMIUM LASER LITHOTRIPSY;  Surgeon: Vanna Scotland, MD;  Location: [facility];  Service: Urology;  Laterality: Left;  . WISDOM TOOTH EXTRACTION      Home Medications:  Allergies as of 06/20/2019      Reactions   Toradol [ketorolac Tromethamine] Other (See Comments)   Numbness of legs      Medication List    as of June 20, 2019 12:24 PM   You have not been prescribed any medications.     Allergies:  Allergies  Allergen Reactions  . Toradol [Ketorolac Tromethamine] Other (See Comments)    Numbness of legs    Family History: Family History  Problem Relation Age of Onset  . Nephrolithiasis Maternal Grandfather   . Nephrolithiasis Maternal Aunt   . Heart disease Maternal Grandmother   . Bladder Cancer Paternal Grandmother     Social History:  reports that she has never smoked. She has never used smokeless tobacco. She reports current alcohol use. She reports that she does not use drugs.   Physical Exam: BP 113/77 (BP Location: Left Arm, Patient Position: Sitting, Cuff Size: Large)   Pulse 99   Ht 5\' 4"  (1.626 m)   Wt 175 lb (79.4 kg)   LMP 05/27/2019 Comment: IUD removed Feb 15, preg waiver signed  BMI 30.04 kg/m   Constitutional:  Alert and oriented, No acute distress. HEENT: [place] AT, moist mucus membranes.  Trachea midline, no masses.  Cardiovascular: No clubbing, cyanosis, or edema. Respiratory: Normal respiratory effort, no increased work of breathing. Skin: No rashes, bruises or suspicious lesions. Neurologic: Grossly intact, no focal deficits, moving all 4 extremities. Psychiatric: Normal mood and affect.  Laboratory Data: Urinalysis Dipstick/microscopy negative  Pertinent Imaging: CT 09/2014 personally reviewed   Assessment & Plan:    - Nephrolithiasis 29 year-old female with history of significant stone growth during  pregnancy.  She has a 3 mm right upper pole renal calculus.  She is planning a second pregnancy in the near future and would like to have the stone treated.  She would be a good candidate for shockwave lithotripsy.  The alternative of ureteroscopy and surveillance was also discussed.  She would like to schedule ESWL.  The indications and nature of the planned procedure were discussed as well as the potential benefits and expected outcome.  Alternatives were discussed as described above.  The most common complications and side effects were discussed as outlined in the [facility] consent form.  It was stressed that there is no guarantee that lithotripsy will be successful and she could require retreatment or alternative treatment.  The rare instance of perirenal bleeding requiring hospitalization, transfusion and rarely surgery were discussed.  The possibility of renal colic from obstructing stone fragments was discussed but would be low based on her stone size.   Abbie Sons, [place] [address] [place], [place] [address] [phone]

## 2019-09-23 ENCOUNTER — Emergency Department
Admission: EM | Admit: 2019-09-23 | Discharge: 2019-09-23 | Disposition: A | Discharge status: Home / Self Care | Payer: Managed Care, Other (non HMO) | Attending: Student | Admitting: Student

## 2019-09-23 ENCOUNTER — Other Ambulatory Visit: Payer: Self-pay

## 2019-09-23 ENCOUNTER — Emergency Department: Payer: Managed Care, Other (non HMO)

## 2019-09-23 ENCOUNTER — Telehealth: Payer: Self-pay

## 2019-09-23 DIAGNOSIS — R1030 Lower abdominal pain, unspecified: Secondary | ICD-10-CM | POA: Insufficient documentation

## 2019-09-23 DIAGNOSIS — R102 Pelvic and perineal pain: Secondary | ICD-10-CM

## 2019-09-23 DIAGNOSIS — O209 Hemorrhage in early pregnancy, unspecified: Secondary | ICD-10-CM | POA: Diagnosis present

## 2019-09-23 DIAGNOSIS — O26899 Other specified pregnancy related conditions, unspecified trimester: Secondary | ICD-10-CM

## 2019-09-23 DIAGNOSIS — O4690 Antepartum hemorrhage, unspecified, unspecified trimester: Secondary | ICD-10-CM

## 2019-09-23 LAB — CBC
HCT: 40 % (ref 36.0–46.0)
Hemoglobin: 14.2 g/dL (ref 12.0–15.0)
MCH: 29.6 pg (ref 26.0–34.0)
MCHC: 35.5 g/dL (ref 30.0–36.0)
MCV: 83.5 fL (ref 80.0–100.0)
Platelets: 318 K/uL (ref 150–400)
RBC: 4.79 MIL/uL (ref 3.87–5.11)
RDW: 12 % (ref 11.5–15.5)
WBC: 10.7 K/uL — ABNORMAL HIGH (ref 4.0–10.5)
nRBC: 0 % (ref 0.0–0.2)

## 2019-09-23 LAB — POCT PREGNANCY, URINE: Preg Test, Ur: NEGATIVE

## 2019-09-23 LAB — BASIC METABOLIC PANEL WITH GFR
Anion gap: 9 (ref 5–15)
BUN: 10 mg/dL (ref 6–20)
CO2: 25 mmol/L (ref 22–32)
Calcium: 9.5 mg/dL (ref 8.9–10.3)
Chloride: 106 mmol/L (ref 98–111)
Creatinine, Ser: 0.78 mg/dL (ref 0.44–1.00)
GFR calc Af Amer: >60 mL/min
GFR calc non Af Amer: >60 mL/min
Glucose, Bld: 90 mg/dL (ref 70–99)
Potassium: 3.6 mmol/L (ref 3.5–5.1)
Sodium: 140 mmol/L (ref 135–145)

## 2019-09-23 LAB — ABO/RH: ABO/RH(D): B POS

## 2019-09-23 LAB — HCG, QUANTITATIVE, PREGNANCY: hCG, Beta Chain, Quant, S: 17 m[IU]/mL — ABNORMAL HIGH

## 2019-09-23 NOTE — Discharge Instructions (Addendum)
Thank you for letting us take care of you in the emergency department today.  As we discussed, your work-up here in the ER will require further follow-up with your OB/GYN doctor to determine if you are in early pregnancy vs possible pregnancy loss vs pregnancy in an abnormal location.  Please follow-up with your OB/GYN doctor in 48 hours if possible, if not on Thursday at your appointment.  Please return to the ER for any new or worsening symptoms.  Including severe abdominal pain, heavy bleeding going through a pad per hour, or any other signs/symptoms that are concerning to you.

## 2019-09-23 NOTE — Telephone Encounter (Signed)
Pt calling; found out she is preg recently; has appt Thursday; started bleeding like a period this am.  [phone]  Adv pt to go to ED as we have no availability today.  Pt states cramping is coming and going.  Pt estimates she is 4-5wks.  Pt asked if she had to go to ED.  Adv we couldn't make her; if started bleeding so heavy that she saturates a pad in 67min-1hr to definitely go to ED.  To go to ED if feels bad, dizzy, weak.  Reiterated to go to ED.

## 2019-09-23 NOTE — ED Triage Notes (Signed)
Pt comes via POV from [facility] with c/o vaginal bleeding that started today. Pt state she is about 4-[redacted] weeks pregnant. Pt has not had OBGYN appt yet.  Pt states little cramping earlier. Pt states the bleeding has slowed down but still constant.

## 2019-09-23 NOTE — ED Provider Notes (Signed)
[facility] Emergency Department Provider Note  ____________________________________________   First MD Initiated Contact with Patient 09/23/19 1455     (approximate)  I have reviewed the triage vital signs and the nursing notes.  History  Chief Complaint Vaginal Bleeding    HPI Sherry Townsend is a 29 y.o. female G2P1001 who presents to the ER for vaginal bleeding in early pregnancy. LMP 08/17/19. Took several pregnancy tests at home last week which were all positive.  Patient states symptoms started this morning, have been constant and since onset.  Initially started with light bleeding, then progressed to heavier bleeding, similar to her period.  Has used equivalent of 2 pads throughout the day.  Also associated with some lower abdominal cramping, mild in severity, no radiation, no alleviating/aggravating components.  Has an appointment with her OB this Thursday, but has not yet had a confirmatory ultrasound this pregnancy.  This is a planned and desired pregnancy.  Last pap smear in February was normal.   Past Medical Hx Past Medical History:  Diagnosis Date  . Anemia   . Kidney stone   . Kidney stone complicating pregnancy 09/10/6071    Problem List Patient Active Problem List   Diagnosis Date Noted  . Postpartum care following vaginal delivery 09/19/2015  . Nephrolithiasis 06/05/2015    Past Surgical Hx Past Surgical History:  Procedure Laterality Date  . CYSTOSCOPY W/ RETROGRADES Left 12/28/2015   Procedure: CYSTOSCOPY WITH RETROGRADE PYELOGRAM;  Surgeon: Hollice Espy, MD;  Location: [facility] ORS;  Service: Urology;  Laterality: Left;  . CYSTOSCOPY WITH STENT PLACEMENT Left 12/28/2015   Procedure: CYSTOSCOPY WITH STENT PLACEMENT;  Surgeon: Hollice Espy, MD;  Location: [facility] ORS;  Service: Urology;  Laterality: Left;  . URETEROSCOPY WITH HOLMIUM LASER LITHOTRIPSY Left 12/28/2015   Procedure: URETEROSCOPY WITH HOLMIUM LASER LITHOTRIPSY;  Surgeon:  Hollice Espy, MD;  Location: [facility] ORS;  Service: Urology;  Laterality: Left;  . WISDOM TOOTH EXTRACTION      Medications Prior to Admission medications   Not on File    Allergies Toradol [ketorolac tromethamine]  Family Hx Family History  Problem Relation Age of Onset  . Nephrolithiasis Maternal Grandfather   . Nephrolithiasis Maternal Aunt   . Heart disease Maternal Grandmother   . Bladder Cancer Paternal Grandmother     Social Hx Social History   Tobacco Use  . Smoking status: Never Smoker  . Smokeless tobacco: Never Used  Vaping Use  . Vaping Use: Never used  Substance Use Topics  . Alcohol use: Yes    Alcohol/week: 0.0 standard drinks    Comment: OCC  . Drug use: No     Review of Systems  Constitutional: Negative for fever. Negative for chills. Eyes: Negative for visual changes. ENT: Negative for sore throat. Cardiovascular: Negative for chest pain. Respiratory: Negative for shortness of breath. Gastrointestinal: Negative for nausea. Negative for vomiting. + abdominal cramping Genitourinary: + vaginal bleeding Musculoskeletal: Negative for leg swelling. Skin: Negative for rash. Neurological: Negative for headaches.   Physical Exam  Vital Signs: ED Triage Vitals [09/23/19 1406]  Enc Vitals Group     BP (!) 135/91     Pulse Rate 96     Resp 19     Temp 98.3 F (36.8 C)     Temp src      SpO2 100 %     Weight 176 lb (79.8 kg)     Height 5' 3.5" (1.613 m)     Head Circumference  Peak Flow      Pain Score 1     Pain Loc      Pain Edu?      Excl. in GC?     Constitutional: Alert and oriented. Well appearing. NAD.  Head: Normocephalic. Atraumatic. Eyes: Conjunctivae clear. Sclera anicteric. Pupils equal and symmetric. Nose: No masses or lesions. No congestion or rhinorrhea. Mouth/Throat: Wearing mask.  Neck: No stridor. Trachea midline.  Cardiovascular: Normal rate, regular rhythm. Extremities well perfused. Respiratory: Normal  respiratory effort.  Lungs CTAB. Gastrointestinal: Soft. Non-distended. Non-tender.  Genitourinary: Deferred. Musculoskeletal: No lower extremity edema. No deformities. Neurologic:  Normal speech and language. No gross focal or lateralizing neurologic deficits are appreciated.  Skin: Skin is warm, dry and intact. No rash noted. Psychiatric: Mood and affect are appropriate for situation.  Radiology  Personally reviewed available imaging myself.   Ultrasound - IMPRESSION: No intrauterine pregnancy or findings suspicious for ectopic pregnancy. This is not unexpected given beta HCG of 17. Findings are consistent with pregnancy of unknown location and may reflect early intrauterine pregnancy not yet visualized sonographically, occult ectopic pregnancy, or failed pregnancy. Recommend trending of beta HCG and sonographic follow-up in 7-10 days as indicated.   Procedures  Procedure(s) performed (including critical care):  Procedures   Initial Impression / Assessment and Plan / MDM / ED Course  29 y.o. female who presents to the ED for vaginal bleeding in early pregnancy  Ddx: miscarriage, ectopic, threatened miscarriage, early pregnancy, PUL  Will plan for labs, imaging.  Blood type B+, does not require Rhogam. Upreg negative, hCG 17, concerning for miscarriage v ectopic v early pregnancy.   Ultrasound reveals no intrauterine pregnancy or findings suspicious for ectopic, not unexpected given her hCG. Findings consistent w/ PUL and may reflect early pregnancy versus occult ectopic versus failed pregnancy.  Discussed results with the patient and partner at bedside, she voices understanding of the situation.  Advised follow-up with her OB in 48 hours for recheck and discussed strict return precautions. They voice understanding and are comfortable w/ the plan and discharge.   _______________________________   As part of my medical decision making I have reviewed available labs,  radiology tests, reviewed old records/performed chart review.    Final Clinical Impression(s) / ED Diagnosis  Final diagnoses:  Vaginal bleeding affecting early pregnancy  Pelvic cramping in antepartum period       Note:  This document was prepared using Dragon voice recognition software and may include unintentional dictation errors.   Miguel Aschoff., MD 09/23/19 [phone]

## 2019-09-23 NOTE — ED Triage Notes (Signed)
First Nurse Note:  Arrives from [facility] for ED evaluation for vaginal bleeding.  Patient states she has had a positive home pregnancy test.  AAOx3.  Skin warm and dry. NAD

## 2019-09-26 ENCOUNTER — Encounter: Payer: Self-pay | Admitting: Obstetrics

## 2019-09-26 ENCOUNTER — Ambulatory Visit (INDEPENDENT_AMBULATORY_CARE_PROVIDER_SITE_OTHER): Payer: Managed Care, Other (non HMO) | Admitting: Obstetrics

## 2019-09-26 ENCOUNTER — Other Ambulatory Visit: Payer: Self-pay

## 2019-09-26 ENCOUNTER — Ambulatory Visit: Payer: Managed Care, Other (non HMO) | Admitting: Obstetrics

## 2019-09-26 VITALS — BP 120/80 | Ht 63.0 in | Wt 178.0 lb

## 2019-09-26 DIAGNOSIS — Z3200 Encounter for pregnancy test, result unknown: Secondary | ICD-10-CM

## 2019-09-26 NOTE — Patient Instructions (Signed)
First Trimester of Pregnancy The first trimester of pregnancy is from week 1 until the end of week 13 (months 1 through 3). A week after a sperm fertilizes an egg, the egg will implant on the wall of the uterus. This embryo will begin to develop into a baby. Genes from you and your partner will form the baby. The female genes will determine whether the baby will be a boy or a girl. At 6-8 weeks, the eyes and face will be formed, and the heartbeat can be seen on ultrasound. At the end of 12 weeks, all the baby's organs will be formed. Now that you are pregnant, you will want to do everything you can to have a healthy baby. Two of the most important things are to get good prenatal care and to follow your health care provider's instructions. Prenatal care is all the medical care you receive before the baby's birth. This care will help prevent, find, and treat any problems during the pregnancy and childbirth. Body changes during your first trimester Your body goes through many changes during pregnancy. The changes vary from woman to woman.  You may gain or lose a couple of pounds at first.  You may feel sick to your stomach (nauseous) and you may throw up (vomit). If the vomiting is uncontrollable, call your health care provider.  You may tire easily.  You may develop headaches that can be relieved by medicines. All medicines should be approved by your health care provider.  You may urinate more often. Painful urination may mean you have a bladder infection.  You may develop heartburn as a result of your pregnancy.  You may develop constipation because certain hormones are causing the muscles that push stool through your intestines to slow down.  You may develop hemorrhoids or swollen veins (varicose veins).  Your breasts may begin to grow larger and become tender. Your nipples may stick out more, and the tissue that surrounds them (areola) may become darker.  Your gums may bleed and may be  sensitive to brushing and flossing.  Dark spots or blotches (chloasma, mask of pregnancy) may develop on your face. This will likely fade after the baby is born.  Your menstrual periods will stop.  You may have a loss of appetite.  You may develop cravings for certain kinds of food.  You may have changes in your emotions from day to day, such as being excited to be pregnant or being concerned that something may go wrong with the pregnancy and baby.  You may have more vivid and strange dreams.  You may have changes in your hair. These can include thickening of your hair, rapid growth, and changes in texture. Some women also have hair loss during or after pregnancy, or hair that feels dry or thin. Your hair will most likely return to normal after your baby is born. What to expect at prenatal visits During a routine prenatal visit:  You will be weighed to make sure you and the baby are growing normally.  Your blood pressure will be taken.  Your abdomen will be measured to track your baby's growth.  The fetal heartbeat will be listened to between weeks 10 and 14 of your pregnancy.  Test results from any previous visits will be discussed. Your health care provider may ask you:  How you are feeling.  If you are feeling the baby move.  If you have had any abnormal symptoms, such as leaking fluid, bleeding, severe headaches, or abdominal   cramping.  If you are using any tobacco products, including cigarettes, chewing tobacco, and electronic cigarettes.  If you have any questions. Other tests that may be performed during your first trimester include:  Blood tests to find your blood type and to check for the presence of any previous infections. The tests will also be used to check for low iron levels (anemia) and protein on red blood cells (Rh antibodies). Depending on your risk factors, or if you previously had diabetes during pregnancy, you may have tests to check for high blood sugar  that affects pregnant women (gestational diabetes).  Urine tests to check for infections, diabetes, or protein in the urine.  An ultrasound to confirm the proper growth and development of the baby.  Fetal screens for spinal cord problems (spina bifida) and Down syndrome.  HIV (human immunodeficiency virus) testing. Routine prenatal testing includes screening for HIV, unless you choose not to have this test.  You may need other tests to make sure you and the baby are doing well. Follow these instructions at home: Medicines  Follow your health care provider's instructions regarding medicine use. Specific medicines may be either safe or unsafe to take during pregnancy.  Take a prenatal vitamin that contains at least 600 micrograms (mcg) of folic acid.  If you develop constipation, try taking a stool softener if your health care provider approves. Eating and drinking   Eat a balanced diet that includes fresh fruits and vegetables, whole grains, good sources of protein such as meat, eggs, or tofu, and low-fat dairy. Your health care provider will help you determine the amount of weight gain that is right for you.  Avoid raw meat and uncooked cheese. These carry germs that can cause birth defects in the baby.  Eating four or five small meals rather than three large meals a day may help relieve nausea and vomiting. If you start to feel nauseous, eating a few soda crackers can be helpful. Drinking liquids between meals, instead of during meals, also seems to help ease nausea and vomiting.  Limit foods that are high in fat and processed sugars, such as fried and sweet foods.  To prevent constipation: ? Eat foods that are high in fiber, such as fresh fruits and vegetables, whole grains, and beans. ? Drink enough fluid to keep your urine clear or pale yellow. Activity  Exercise only as directed by your health care provider. Most women can continue their usual exercise routine during  pregnancy. Try to exercise for 30 minutes at least 5 days a week. Exercising will help you: ? Control your weight. ? Stay in shape. ? Be prepared for labor and delivery.  Experiencing pain or cramping in the lower abdomen or lower back is a good sign that you should stop exercising. Check with your health care provider before continuing with normal exercises.  Try to avoid standing for long periods of time. Move your legs often if you must stand in one place for a long time.  Avoid heavy lifting.  Wear low-heeled shoes and practice good posture.  You may continue to have sex unless your health care provider tells you not to. Relieving pain and discomfort  Wear a good support bra to relieve breast tenderness.  Take warm sitz baths to soothe any pain or discomfort caused by hemorrhoids. Use hemorrhoid cream if your health care provider approves.  Rest with your legs elevated if you have leg cramps or low back pain.  If you develop varicose veins in   your legs, wear support hose. Elevate your feet for 15 minutes, 3-4 times a day. Limit salt in your diet. Prenatal care  Schedule your prenatal visits by the twelfth week of pregnancy. They are usually scheduled monthly at first, then more often in the last 2 months before delivery.  Write down your questions. Take them to your prenatal visits.  Keep all your prenatal visits as told by your health care provider. This is important. Safety  Wear your seat belt at all times when driving.  Make a list of emergency phone numbers, including numbers for family, friends, the hospital, and police and fire departments. General instructions  Ask your health care provider for a referral to a local prenatal education class. Begin classes no later than the beginning of month 6 of your pregnancy.  Ask for help if you have counseling or nutritional needs during pregnancy. Your health care provider can offer advice or refer you to specialists for help  with various needs.  Do not use hot tubs, steam rooms, or saunas.  Do not douche or use tampons or scented sanitary pads.  Do not cross your legs for long periods of time.  Avoid cat litter boxes and soil used by cats. These carry germs that can cause birth defects in the baby and possibly loss of the fetus by miscarriage or stillbirth.  Avoid all smoking, herbs, alcohol, and medicines not prescribed by your health care provider. Chemicals in these products affect the formation and growth of the baby.  Do not use any products that contain nicotine or tobacco, such as cigarettes and e-cigarettes. If you need help quitting, ask your health care provider. You may receive counseling support and other resources to help you quit.  Schedule a dentist appointment. At home, brush your teeth with a soft toothbrush and be gentle when you floss. Contact a health care provider if:  You have dizziness.  You have mild pelvic cramps, pelvic pressure, or nagging pain in the abdominal area.  You have persistent nausea, vomiting, or diarrhea.  You have a bad smelling vaginal discharge.  You have pain when you urinate.  You notice increased swelling in your face, hands, legs, or ankles.  You are exposed to fifth disease or chickenpox.  You are exposed to German measles (rubella) and have never had it. Get help right away if:  You have a fever.  You are leaking fluid from your vagina.  You have spotting or bleeding from your vagina.  You have severe abdominal cramping or pain.  You have rapid weight gain or loss.  You vomit blood or material that looks like coffee grounds.  You develop a severe headache.  You have shortness of breath.  You have any kind of trauma, such as from a fall or a car accident. Summary  The first trimester of pregnancy is from week 1 until the end of week 13 (months 1 through 3).  Your body goes through many changes during pregnancy. The changes vary from  woman to woman.  You will have routine prenatal visits. During those visits, your health care provider will examine you, discuss any test results you may have, and talk with you about how you are feeling. This information is not intended to replace advice given to you by your health care provider. Make sure you discuss any questions you have with your health care provider. Document Revised: 03/10/2017 Document Reviewed: 03/09/2016 Elsevier Patient Education  2020 [employer]  

## 2019-09-26 NOTE — Progress Notes (Signed)
Obstetrics & Gynecology Office Visit   Chief Complaint:  Chief Complaint  Patient presents with  . Threatened Miscarriage    pt has been bleeding since monday    History of Present Illness: Nene and her husband have been trying to conceive since she had her IUD removed in February of this year.  Last week she made an appointment for a new OB visit after three positive home pregnancy tests (June 9,10,11). Since then she started a period on June 14th.she decided to come to the office for a pregnancy test today, although she thinks that she is not pregnant since starting her menses.   Review of Systems:  ROS   Past Medical History:  Past Medical History:  Diagnosis Date  . Anemia   . Kidney stone   . Kidney stone complicating pregnancy 05/14/2015    Past Surgical History:  Past Surgical History:  Procedure Laterality Date  . CYSTOSCOPY W/ RETROGRADES Left 12/28/2015   Procedure: CYSTOSCOPY WITH RETROGRADE PYELOGRAM;  Surgeon: Vanna Scotland, MD;  Location: [facility] ORS;  Service: Urology;  Laterality: Left;  . CYSTOSCOPY WITH STENT PLACEMENT Left 12/28/2015   Procedure: CYSTOSCOPY WITH STENT PLACEMENT;  Surgeon: Vanna Scotland, MD;  Location: [facility] OR[facility];  Service: Urology;  Laterality: Left;  . URETEROSCOPY WITH HOLMIUM LASER LITHOTRIPSY Left 12/28/2015   Procedure: URETEROSCOPY WITH HOLMIUM LASER LITHOTRIPSY;  Surgeon: Vanna Scotland, MD;  Location: [facility] OR[facility];  Service: Urology;  Laterality: Left;  . WISDOM TOOTH EXTRACTION      Gynecologic History: Patient's last menstrual period was 05/27/2019.  Obstetric History: G2P1001  Family History:  Family History  Problem Relation Age of Onset  . Nephrolithiasis Maternal Grandfather   . Nephrolithiasis Maternal Aunt   . Heart disease Maternal Grandmother   . Bladder Cancer Paternal Grandmother     Social History:  Social History   Socioeconomic History  . Marital status: Married    Spouse name: Not on file  . Number of  children: Not on file  . Years of education: Not on file  . Highest education level: Not on file  Occupational History  . Not on file  Tobacco Use  . Smoking status: Never Smoker  . Smokeless tobacco: Never Used  Vaping Use  . Vaping Use: Never used  Substance and Sexual Activity  . Alcohol use: Yes    Alcohol/week: 0.0 standard drinks    Comment: OCC  . Drug use: No  . Sexual activity: Yes    Birth control/protection: I.U.D.    Comment: Mirena  Other Topics Concern  . Not on file  Social History Narrative  . Not on file   Social Determinants of Health   Financial Resource Strain:   . Difficulty of Paying Living Expenses:   Food Insecurity:   . Worried About Programme researcher, broadcasting/film/video in the Last Year:   . Barista in the Last Year:   Transportation Needs:   . Freight forwarder (Medical):   Marland Kitchen Lack of Transportation (Non-Medical):   Physical Activity:   . Days of Exercise per Week:   . Minutes of Exercise per Session:   Stress:   . Feeling of Stress :   Social Connections:   . Frequency of Communication with Friends and Family:   . Frequency of Social Gatherings with Friends and Family:   . Attends Religious Services:   . Active Member of Clubs or Organizations:   . Attends Banker Meetings:   Marland Kitchen Marital  Status:   Intimate Partner Violence:   . Fear of Current or Ex-Partner:   . Emotionally Abused:   Marland Kitchen Physically Abused:   . Sexually Abused:     Allergies:  Allergies  Allergen Reactions  . Toradol [Ketorolac Tromethamine] Other (See Comments)    Numbness of legs    Medications: Prior to Admission medications   Not on File    Physical Exam Vitals:  Vitals:   09/26/19 1428  BP: 120/80   Patient's last menstrual period was 05/27/2019.  Physical Exam  Reserved to the pelvic/GYN. Normal female anatomy . Entire mons is shaved. No rashes, lesions irritation noted on visual inspection. Spec exam: normal vaginal rugae, scant amount  of brownish vaginal discharge noted.cervix is closed/long Uterus is anteverted, non enlarged. No adnexal masses palpated.   Assessment: 29 y.o. G2P1001  Suspected pregnancy, ? Early SAB   Plan: Problem List Items Addressed This Visit    None    Visit Diagnoses    Possible pregnancy, not confirmed    -  Primary     We discussed her continuing to try for another pregnancy. She will follow up at [facility].  Advised to continue taking a daily multivitamin with folic acid.   Imagene Riches, CNM  09/26/2019 3:35 PM

## 2019-09-26 NOTE — Patient Instructions (Signed)
Natural Family Planning  Natural Family Planning (NFP) is a type of birth control (contraception) in which no form of contraceptive medicine or device is used. The NFP method relies on knowing which days of the month a woman's ovary is producing an egg (ovulation). Ovulation is the time in the menstrual cycle when a woman is most fertile and, therefore, most likely to become pregnant. To lower the chance of pregnancy, sex is avoided during ovulation. NFP is a safe method of birth control and can prevent pregnancy if it is done correctly. However, NFP does not provide protection from sexually transmitted diseases. NFP can also be used as a method of getting pregnant, by deciding to have sex during ovulation. How does the NFP method work? NFP works by making both sexual partners aware of how the woman's body functions during her menstrual cycle.  Usually, a woman has a menstrual period every 28-30 days. However, there can be 23-35 days between each menstrual period. This varies for each woman. A woman with a 28-day menstrual cycle has about 6 days a month when she is most likely to get pregnant.  Ovulation happens 12-14 days before the start of the next menstrual period. There are different methods that are used to determine when ovulation starts.  An egg is fertile for 24 hours after it is released from the ovary. Sperm can live for 3 days or more. What NFP methods can be used to prevent pregnancy? The basal body temperature method During ovulation, there is often a slight increase in body temperature. To use this method:  Take your temperature every morning before getting out of bed. Write the temperature on a chart.  Do not have sex from the day the menstrual periods starts until 3 days after the increase in temperature. Note that body temperature may increase as a result of various factors, including fever, restless sleep, and working schedules. The cervical mucus method Right before  ovulation, mucus from the lower part of the uterus (cervix) changes from dry and sticky to wet and slippery. To use this method:  Check the mucus every day to look for these changes. Ovulation happens on the last day of wet, slippery mucus.  Do not have sex starting when you first see wet, slippery mucus and until 4 days after it stops or returns to its normal consistency.  With this method, it is safe to have sex: ? After the 4 days have passed, and until 10 days after the menstrual period starts. ? On days when mucus is dry. Note that cervical mucus can increase or change consistency due to reasons other than ovulation, such as infection, lubricants, some medicines, and sexual arousal. Other variations of the cervical mucus method include the TwoDay method, [place] ovulation method, and the [employer]. The symptothermal method This method combines the basal body temperature and the cervical mucus methods. The calendar method This method involves tracking menstrual cycles to determine when ovulation occurs. This method is helpful when the menstrual cycle varies in length. To use this method:  For 6 months, record when menstrual periods start and end and the length of each menstrual cycle. The length of a menstrual cycle is from day 1 of the present menstrual period to day 1 of the next menstrual period.  Use this information to determine when you will likely ovulate. Avoid sex during that time. You may need help from your health care provider to determine which days you are most likely to get pregnant. ?  Ovulation usually happens 12-14 days before the start of the next menstrual period. ? Light vaginal bleeding (spotting) or abdominal cramps during the middle of a menstrual cycle may be signs of ovulation. However, not all women have these symptoms. The standard days method This method is based on studies of women's hormone levels throughout normal menstrual cycles. Based on these  studies, a standard rule was developed to predict when women are most fertile during their menstrual cycle. According to the rule, if your cycle is 26-32 days long, you are most fertile between days 8 and 19. To prevent pregnancy, you should avoid having sex during this time, or use a barrier method of birth control. This method works best if your cycle is regularly between 26-32 days long. When should I not use the NFP method? You should not use NFP if:  You have very irregular menstrual periods or you sometimes skip a menstrual period.  You have abnormal vaginal bleeding.  You recently had a baby or are breastfeeding.  You have a vaginal or cervical infection.  You take medicines that can affect vaginal mucus or body temperature. These medicines include antibiotics, thyroid medicines, and antihistamines that are found in some cold and allergy medicines.  You absolutely do not want to become pregnant at the current time. Other methods of contraception are more effective at preventing pregnancy than NFP.  You are concerned about sexually transmitted diseases. Summary  Natural Family Planning methods help women and their partners to understand how to avoid pregnancy without using medicines or other methods.  A woman learns to recognize her most fertile days. A woman with a 28 day menstrual cycle has about 6 days per month when she can get pregnant. This information is not intended to replace advice given to you by your health care provider. Make sure you discuss any questions you have with your health care provider. Document Revised: 07/20/2018 Document Reviewed: 05/16/2016 Elsevier Patient Education  2020 Elsevier Inc.  

## 2020-04-11 NOTE — L&D Delivery Note (Signed)
Date of delivery: 02/02/2021 Estimated Date of Delivery: 02/03/21 Patient's last menstrual period was 04/29/2020. EGA: [redacted]w[redacted]d  Delivery Note At 1:04 AM a viable female was delivered via Vaginal, Spontaneous  Presentation: OA, ROA       APGAR: 8, 9;     Weight:  3610 g, 7 pounds 15 ounces Placenta status: Spontaneous, Intact.   Cord: 3 vessels with the following complications: None.  Cord pH: NA  Called to see patient.  Mom pushed to deliver a viable female infant.  The head followed by shoulders, which delivered without difficulty, and the rest of the body.  No nuchal cord noted.  Baby to mom's chest.  Cord clamped and cut after 4 min delay.  No cord blood obtained.  Placenta delivered spontaneously, intact, with a 3-vessel cord.   All counts correct.  Hemostasis obtained with IV pitocin and fundal massage.    Anesthesia: Epidural Episiotomy: None Lacerations: very small 1st degree, hemostatic, no repair needed Suture Repair:  NA Est. Blood Loss (mL): 350  Mom to postpartum.  Baby to Couplet care / Skin to Skin.  Tresea Mall, CNM 02/02/2021, 1:44 AM

## 2020-06-02 ENCOUNTER — Telehealth: Payer: Self-pay

## 2020-06-02 NOTE — Telephone Encounter (Signed)
No ma'am not since we have not seen her in over 6 months. Can you please advise pt?

## 2020-06-02 NOTE — Telephone Encounter (Signed)
Pt aware.

## 2020-06-02 NOTE — Telephone Encounter (Signed)
Pt calling to see if we can help her c FMLA paperwork.  [phone]  Pt states she is out of work for covid; has her test results from [facility] and [employer] (employee).  She does not have a primary MD; only sees [place].  Can we fill out FMLA paperwork for her so she can get her short term disability?

## 2020-06-22 ENCOUNTER — Ambulatory Visit (INDEPENDENT_AMBULATORY_CARE_PROVIDER_SITE_OTHER): Payer: Managed Care, Other (non HMO) | Admitting: Obstetrics and Gynecology

## 2020-06-22 ENCOUNTER — Encounter: Payer: Self-pay | Admitting: Obstetrics and Gynecology

## 2020-06-22 ENCOUNTER — Other Ambulatory Visit: Payer: Self-pay

## 2020-06-22 VITALS — BP 120/70 | Ht 64.0 in | Wt 181.2 lb

## 2020-06-22 DIAGNOSIS — R875 Abnormal microbiological findings in specimens from female genital organs: Secondary | ICD-10-CM

## 2020-06-22 DIAGNOSIS — Z13228 Encounter for screening for other metabolic disorders: Secondary | ICD-10-CM

## 2020-06-22 DIAGNOSIS — Z3A01 Less than 8 weeks gestation of pregnancy: Secondary | ICD-10-CM

## 2020-06-22 DIAGNOSIS — Z3481 Encounter for supervision of other normal pregnancy, first trimester: Secondary | ICD-10-CM

## 2020-06-22 LAB — POCT URINALYSIS DIPSTICK OB
Glucose, UA: NEGATIVE
POC,PROTEIN,UA: NEGATIVE

## 2020-06-22 NOTE — Addendum Note (Signed)
Addended by: Adelene Idler on: 06/22/2020 04:36 PM   Modules accepted: Orders

## 2020-06-22 NOTE — Addendum Note (Signed)
Addended by: Clement Husbands A on: 06/22/2020 04:29 PM   Modules accepted: Orders

## 2020-06-22 NOTE — Patient Instructions (Signed)
Obstetrics: Normal and Problem Pregnancies (7th ed., pp. 102-121). [place], [place]: Elsevier."> Textbook of Family Medicine (9th ed., pp. 365-410). [place], [place]: Elsevier [employer].">  First Trimester of Pregnancy  The first trimester of pregnancy starts on the first day of your last menstrual period until the end of week 12. This is months 1 through 3 of pregnancy. A week after a sperm fertilizes an egg, the egg will implant into the wall of the uterus and begin to develop into a baby. By the end of 12 weeks, all the baby's organs will be formed and the baby will be 2-3 inches in size. Body changes during your first trimester Your body goes through many changes during pregnancy. The changes vary and generally return to normal after your baby is born. Physical changes  You may gain or lose weight.  Your breasts may begin to grow larger and become tender. The tissue that surrounds your nipples (areola) may become darker.  Dark spots or blotches (chloasma or mask of pregnancy) may develop on your face.  You may have changes in your hair. These can include thickening or thinning of your hair or changes in texture. Health changes  You may feel nauseous, and you may vomit.  You may have heartburn.  You may develop headaches.  You may develop constipation.  Your gums may bleed and may be sensitive to brushing and flossing. Other changes  You may tire easily.  You may urinate more often.  Your menstrual periods will stop.  You may have a loss of appetite.  You may develop cravings for certain kinds of food.  You may have changes in your emotions from day to day.  You may have more vivid and strange dreams. Follow these instructions at home: Medicines  Follow your health care provider's instructions regarding medicine use. Specific medicines may be either safe or unsafe to take during pregnancy. Do not take any medicines unless told to by your health care provider.  Take a  prenatal vitamin that contains at least 600 micrograms (mcg) of folic acid. Eating and drinking  Eat a healthy diet that includes fresh fruits and vegetables, whole grains, good sources of protein such as meat, eggs, or tofu, and low-fat dairy products.  Avoid raw meat and unpasteurized juice, milk, and cheese. These carry germs that can harm you and your baby.  If you feel nauseous or you vomit: ? Eat 4 or 5 small meals a day instead of 3 large meals. ? Try eating a few soda crackers. ? Drink liquids between meals instead of during meals.  You may need to take these actions to prevent or treat constipation: ? Drink enough fluid to keep your urine pale yellow. ? Eat foods that are high in fiber, such as beans, whole grains, and fresh fruits and vegetables. ? Limit foods that are high in fat and processed sugars, such as fried or sweet foods. Activity  Exercise only as directed by your health care provider. Most people can continue their usual exercise routine during pregnancy. Try to exercise for 30 minutes at least 5 days a week.  Stop exercising if you develop pain or cramping in the lower abdomen or lower back.  Avoid exercising if it is very hot or humid or if you are at high altitude.  Avoid heavy lifting.  If you choose to, you may have sex unless your health care provider tells you not to. Relieving pain and discomfort  Wear a good support bra to relieve breast   tenderness.  Rest with your legs elevated if you have leg cramps or low back pain.  If you develop bulging veins (varicose veins) in your legs: ? Wear support hose as told by your health care provider. ? Elevate your feet for 15 minutes, 3-4 times a day. ? Limit salt in your diet. Safety  Wear your seat belt at all times when driving or riding in a car.  Talk with your health care provider if someone is verbally or physically abusive to you.  Talk with your health care provider if you are feeling sad or have  thoughts of hurting yourself. Lifestyle  Do not use hot tubs, steam rooms, or saunas.  Do not douche. Do not use tampons or scented sanitary pads.  Do not use herbal remedies, alcohol, illegal drugs, or medicines that are not approved by your health care provider. Chemicals in these products can harm your baby.  Do not use any products that contain nicotine or tobacco, such as cigarettes, e-cigarettes, and chewing tobacco. If you need help quitting, ask your health care provider.  Avoid cat litter boxes and soil used by cats. These carry germs that can cause birth defects in the baby and possibly loss of the unborn baby (fetus) by miscarriage or stillbirth. General instructions  During routine prenatal visits in the first trimester, your health care provider will do a physical exam, perform necessary tests, and ask you how things are going. Keep all follow-up visits. This is important.  Ask for help if you have counseling or nutritional needs during pregnancy. Your health care provider can offer advice or refer you to specialists for help with various needs.  Schedule a dentist appointment. At home, brush your teeth with a soft toothbrush. Floss gently.  Write down your questions. Take them to your prenatal visits. Where to find more information  American Pregnancy Association: americanpregnancy.org  American College of Obstetricians and Gynecologists: acog.org/en/Womens%20Health/Pregnancy  Office on Women's Health: womenshealth.gov/pregnancy Contact a health care provider if you have:  Dizziness.  A fever.  Mild pelvic cramps, pelvic pressure, or nagging pain in the abdominal area.  Nausea, vomiting, or diarrhea that lasts for 24 hours or longer.  A bad-smelling vaginal discharge.  Pain when you urinate.  Known exposure to a contagious illness, such as chickenpox, measles, Zika virus, HIV, or hepatitis. Get help right away if you have:  Spotting or bleeding from your  vagina.  Severe abdominal cramping or pain.  Shortness of breath or chest pain.  Any kind of trauma, such as from a fall or a car crash.  New or increased pain, swelling, or redness in an arm or leg. Summary  The first trimester of pregnancy starts on the first day of your last menstrual period until the end of week 12 (months 1 through 3).  Eating 4 or 5 small meals a day rather than 3 large meals may help to relieve nausea and vomiting.  Do not use any products that contain nicotine or tobacco, such as cigarettes, e-cigarettes, and chewing tobacco. If you need help quitting, ask your health care provider.  Keep all follow-up visits. This is important. This information is not intended to replace advice given to you by your health care provider. Make sure you discuss any questions you have with your health care provider. Document Revised: 09/04/2019 Document Reviewed: 07/11/2019 Elsevier Patient Education  2021 Elsevier Inc.  

## 2020-06-22 NOTE — Progress Notes (Signed)
06/22/2020   Chief Complaint: Missed period  Transfer of Care Patient: no  History of Present Illness: [name] is a 30 y.o. G3P1001 [redacted]w[redacted]d based on Patient's last menstrual period was 04/29/2020. with an Estimated Date of Delivery: 02/03/21, with the above CC.   Her periods were: regular periods every month She was using no method when she conceived.  She has Positive signs or symptoms of nausea/vomiting of pregnancy. She has Negative signs or symptoms of miscarriage or preterm labor She was not taking different medications around the time she conceived/early pregnancy. Since her LMP, she has used alcohol Since her LMP, she has not used tobacco products Since her LMP, she has not used illegal drugs.    Current or past history of domestic violence. no  Infection History:  1. Since her LMP, she has had a viral illness. Covid feb 2022 2. She admits to close contact with children on a regular basis     3. She has a history of chicken pox. She denies vaccination for chicken pox in the past. 4. Patient or partner has history of genital herpes  no 5. History of STI (GC, CT, HPV, syphilis, HIV)  no   6.  She does not live with someone with TB or TB exposed. 7. History of recent travel :  no 8. She identifies Negative Zika risk factors for her and her partner 5. There are not cats in the home in the home.  She understands that while pregnant she should not change cat litter.   Genetic Screening Questions: (Includes patient, baby's father, or anyone in either family)   1. Patient's age >/= 72 at [facility]  no 2. Thalassemia ([place], [place], Mediterranean, or Asian background): MCV<80  no 3. Neural tube defect (meningomyelocele, spina bifida, anencephaly)  no 4. Congenital heart defect  no  5. Down syndrome  Yes FOB 1/2 sister 6. Tay-Sachs (Jewish, [place]  no 7. Canavan's Disease  no 8. Sickle cell disease or trait (African)  no  9. Hemophilia or other blood disorders  no  10.  Muscular dystrophy  no  11. Cystic fibrosis  no  12. Huntington's Chorea  no  13. Mental retardation/autism  no 14. Other inherited genetic or chromosomal disorder  no 15. Maternal metabolic disorder (DM, PKU, etc)  no 16. Patient or FOB with a child with a birth defect not listed above no  16a. Patient or FOB with a birth defect themselves no 17. Recurrent pregnancy loss, or stillbirth  no  18. Any medications since LMP other than prenatal vitamins (include vitamins, supplements, OTC meds, drugs, alcohol)  no 19. Any other genetic/environmental exposure to discuss  no  ROS:  ROS  OBGYN History: As per HPI. OB History  Gravida Para Term Preterm AB Living  3 1 1     1   SAB IAB Ectopic Multiple Live Births        0 1    # Outcome Date GA Lbr Len/2nd Weight Sex Delivery Anes PTL Lv  3 Current           2 Term 09/20/15 [redacted]w[redacted]d 22:00 / 04:57 7 lb 15 oz (3.6 kg) F Vag-Spont EPI, Local  LIV  1 Gravida             Any issues with any prior pregnancies: no Any prior children are healthy, doing well, without any problems or issues: no Last pap smear 2021- NIL History of STIs: No   Past Medical History: Past Medical  History:  Diagnosis Date  . Anemia   . Kidney stone   . Kidney stone complicating pregnancy 05/14/2015    Past Surgical History: Past Surgical History:  Procedure Laterality Date  . CYSTOSCOPY W/ RETROGRADES Left 12/28/2015   Procedure: CYSTOSCOPY WITH RETROGRADE PYELOGRAM;  Surgeon: Vanna Scotland, MD;  Location: [facility] ORS;  Service: Urology;  Laterality: Left;  . CYSTOSCOPY WITH STENT PLACEMENT Left 12/28/2015   Procedure: CYSTOSCOPY WITH STENT PLACEMENT;  Surgeon: Vanna Scotland, MD;  Location: [facility] ORS;  Service: Urology;  Laterality: Left;  . URETEROSCOPY WITH HOLMIUM LASER LITHOTRIPSY Left 12/28/2015   Procedure: URETEROSCOPY WITH HOLMIUM LASER LITHOTRIPSY;  Surgeon: Vanna Scotland, MD;  Location: [facility] ORS;  Service: Urology;  Laterality: Left;  . WISDOM TOOTH  EXTRACTION      Family History:  Family History  Problem Relation Age of Onset  . Nephrolithiasis Maternal Grandfather   . Nephrolithiasis Maternal Aunt   . Heart disease Maternal Grandmother   . Bladder Cancer Paternal Grandmother    She denies any female cancers, bleeding or blood clotting disorders.   Social History:  Social History   Socioeconomic History  . Marital status: Married    Spouse name: Not on file  . Number of children: Not on file  . Years of education: Not on file  . Highest education level: Not on file  Occupational History  . Not on file  Tobacco Use  . Smoking status: Never Smoker  . Smokeless tobacco: Never Used  Vaping Use  . Vaping Use: Never used  Substance and Sexual Activity  . Alcohol use: Yes    Alcohol/week: 0.0 standard drinks    Comment: OCC  . Drug use: No  . Sexual activity: Yes    Birth control/protection: I.U.D.    Comment: Mirena  Other Topics Concern  . Not on file  Social History Narrative  . Not on file   Social Determinants of Health   Financial Resource Strain: Not on file  Food Insecurity: Not on file  Transportation Needs: Not on file  Physical Activity: Not on file  Stress: Not on file  Social Connections: Not on file  Intimate Partner Violence: Not on file    Allergy: Allergies  Allergen Reactions  . Toradol [Ketorolac Tromethamine] Other (See Comments)    Numbness of legs    Current Outpatient Medications: No current outpatient medications on file.   Physical Exam: Physical Exam   Assessment: Sherry Townsend is a 30 y.o. G3P1001 [redacted]w[redacted]d based on Patient's last menstrual period was 04/29/2020. with an Estimated Date of Delivery: 02/03/21,  for prenatal care.  Plan:  1) Avoid alcoholic beverages. 2) Patient encouraged not to smoke.  3) Discontinue the use of all non-medicinal drugs and chemicals.  4) Take prenatal vitamins daily.  5) Seatbelt use advised 6) Nutrition, food safety (fish, cheese advisories,  and high nitrite foods) and exercise discussed. 7) Hospital and practice style delivering at [facility] discussed  8) Patient is asked about travel to areas at risk for the Zika virus, and counseled to avoid travel and exposure to mosquitoes or sexual partners who may have themselves been exposed to the virus. Testing is discussed, and will be ordered as appropriate.  9) [facility]birth classes at [facility] advised 10) Genetic Screening, such as with 1st Trimester Screening, cell free fetal DNA, AFP testing, and Ultrasound, as well as with amniocentesis and [facility] as appropriate, is discussed with patient. She plans to have genetic testing this pregnancy.   IUP with + FHT  seen  Problem list reviewed and updated.  I discussed the assessment and treatment plan with the patient. The patient was provided an opportunity to ask questions and all were answered. The patient agreed with the plan and demonstrated an understanding of the instructions.  Adelene Idler MD [facility], [facility] 06/22/2020 4:02 PM

## 2020-06-23 LAB — MONITOR DRUG PROFILE 10(MW)
Amphetamine Scrn, Ur: NEGATIVE ng/mL
BARBITURATE SCREEN URINE: NEGATIVE ng/mL
BENZODIAZEPINE SCREEN, URINE: NEGATIVE ng/mL
CANNABINOIDS UR QL SCN: NEGATIVE ng/mL
Cocaine (Metab) Scrn, Ur: NEGATIVE ng/mL
Creatinine(Crt), U: 180.2 mg/dL (ref 20.0–300.0)
Methadone Screen, Urine: NEGATIVE ng/mL
OXYCODONE+OXYMORPHONE UR QL SCN: NEGATIVE ng/mL
Opiate Scrn, Ur: NEGATIVE ng/mL
Ph of Urine: 6.2 (ref 4.5–8.9)
Phencyclidine Qn, Ur: NEGATIVE ng/mL
Propoxyphene Scrn, Ur: NEGATIVE ng/mL

## 2020-06-23 LAB — RPR+RH+ABO+RUB AB+AB SCR+CB...
Antibody Screen: NEGATIVE
HIV Screen 4th Generation wRfx: NONREACTIVE
Hematocrit: 39.7 % (ref 34.0–46.6)
Hemoglobin: 13.6 g/dL (ref 11.1–15.9)
Hepatitis B Surface Ag: NEGATIVE
MCH: 30 pg (ref 26.6–33.0)
MCHC: 34.3 g/dL (ref 31.5–35.7)
MCV: 88 fL (ref 79–97)
Platelets: 293 10*3/uL (ref 150–450)
RBC: 4.53 x10E6/uL (ref 3.77–5.28)
RDW: 12.5 % (ref 11.7–15.4)
RPR Ser Ql: NONREACTIVE
Rh Factor: POSITIVE
Rubella Antibodies, IGG: 3.72 index (ref >0.99)
Varicella zoster IgG: 2280 index (ref >165)
WBC: 9.9 10*3/uL (ref 3.4–10.8)

## 2020-06-23 LAB — HEPATITIS C ANTIBODY: Hep C Virus Ab: <0.1 s/co ratio (ref 0.0–0.9)

## 2020-06-25 LAB — NUSWAB VAGINITIS PLUS (VG+)
Candida albicans, NAA: NEGATIVE
Candida glabrata, NAA: POSITIVE — AB
Chlamydia trachomatis, NAA: NEGATIVE
Neisseria gonorrhoeae, NAA: NEGATIVE
Trich vag by NAA: NEGATIVE

## 2020-06-25 LAB — URINE CULTURE

## 2020-06-30 ENCOUNTER — Ambulatory Visit (INDEPENDENT_AMBULATORY_CARE_PROVIDER_SITE_OTHER): Payer: Managed Care, Other (non HMO)

## 2020-06-30 ENCOUNTER — Encounter: Payer: Self-pay | Admitting: Advanced Practice Midwife

## 2020-06-30 ENCOUNTER — Other Ambulatory Visit: Payer: Self-pay

## 2020-06-30 ENCOUNTER — Ambulatory Visit (INDEPENDENT_AMBULATORY_CARE_PROVIDER_SITE_OTHER): Payer: Managed Care, Other (non HMO) | Admitting: Advanced Practice Midwife

## 2020-06-30 ENCOUNTER — Other Ambulatory Visit: Payer: Self-pay | Admitting: Obstetrics and Gynecology

## 2020-06-30 VITALS — BP 118/74 | Wt 180.0 lb

## 2020-06-30 DIAGNOSIS — Z3490 Encounter for supervision of normal pregnancy, unspecified, unspecified trimester: Secondary | ICD-10-CM | POA: Insufficient documentation

## 2020-06-30 DIAGNOSIS — Z3A08 8 weeks gestation of pregnancy: Secondary | ICD-10-CM

## 2020-06-30 DIAGNOSIS — Z3481 Encounter for supervision of other normal pregnancy, first trimester: Secondary | ICD-10-CM | POA: Diagnosis not present

## 2020-06-30 NOTE — Progress Notes (Signed)
  Routine Prenatal Care Visit  Subjective  Sherry Townsend is a 30 y.o. G3P1011 at [redacted]w[redacted]d being seen today for ongoing prenatal care.  She is currently monitored for the following issues for this low-risk pregnancy and has Nephrolithiasis and Supervision of normal pregnancy on their problem list.  ----------------------------------------------------------------------------------- Patient reports mild nausea. She denies any s/s of yeast infection- her recent lab indicates yeast- advised trying OTC treatment if symptoms appear or let us know if she prefers Rx.    . Vag. Bleeding: None.   . Leaking Fluid denies.  ----------------------------------------------------------------------------------- The following portions of the patient's history were reviewed and updated as appropriate: allergies, current medications, past family history, past medical history, past social history, past surgical history and problem list. Problem list updated.  Objective  Blood pressure 118/74, weight 180 lb (81.6 kg), last menstrual period 04/29/2020 Pregravid weight 178 lb (80.7 kg) Total Weight Gain 2 lb (0.907 kg) Urinalysis: Urine Protein    Urine Glucose    Fetal Status: Fetal Heart Rate (bpm): 175          Dating scan: agrees with LMP, no adjustment of EDD  General:  Alert, oriented and cooperative. Patient is in no acute distress.  Skin: Skin is warm and dry. No rash noted.   Cardiovascular: Normal heart rate noted  Respiratory: Normal respiratory effort, no problems with respiration noted  Abdomen: Soft, gravid, appropriate for gestational age.       Pelvic:  Cervical exam deferred        Extremities: Normal range of motion.     Mental Status: Normal mood and affect. Normal behavior. Normal judgment and thought content.   Assessment   29 y.o. [id number] at [redacted]w[redacted]d by  02/03/2021, by Last Menstrual Period presenting for routine prenatal visit  Plan   pregnancy 2 Problems (from 06/22/20 to present)     Problem Noted Resolved   Supervision of normal pregnancy 06/30/2020 by Tresea Mall, CNM No       Preterm labor symptoms and general obstetric precautions including but not limited to vaginal bleeding, contractions, leaking of fluid and fetal movement were reviewed in detail with the patient.   Return in about 2 weeks (around 07/14/2020) for rob/MaterniT 21.  Tresea Mall, CNM 06/30/2020 2:11 PM

## 2020-06-30 NOTE — Progress Notes (Signed)
Dating scan today. No vb. No lof.  °

## 2020-07-03 LAB — INHERITEST CORE(CF97,SMA,FRAX)

## 2020-07-14 ENCOUNTER — Ambulatory Visit (INDEPENDENT_AMBULATORY_CARE_PROVIDER_SITE_OTHER): Payer: Managed Care, Other (non HMO) | Admitting: Obstetrics

## 2020-07-14 ENCOUNTER — Other Ambulatory Visit: Payer: Self-pay

## 2020-07-14 VITALS — BP 120/74 | Wt 180.0 lb

## 2020-07-14 DIAGNOSIS — Z3A10 10 weeks gestation of pregnancy: Secondary | ICD-10-CM

## 2020-07-14 DIAGNOSIS — O0990 Supervision of high risk pregnancy, unspecified, unspecified trimester: Secondary | ICD-10-CM

## 2020-07-14 DIAGNOSIS — Z3480 Encounter for supervision of other normal pregnancy, unspecified trimester: Secondary | ICD-10-CM

## 2020-07-14 NOTE — Progress Notes (Signed)
No vb. No lof. Labs today.

## 2020-07-14 NOTE — Progress Notes (Signed)
Routine Prenatal Care Visit  Subjective  Shatarra Hennings is a 30 y.o. G3P1011 at [redacted]w[redacted]d being seen today for ongoing prenatal care.  She is currently monitored for the following issues for this high-risk pregnancy and has Nephrolithiasis and Supervision of normal pregnancy on their problem list.  ----------------------------------------------------------------------------------- Patient reports no complaints.    . Vag. Bleeding: None.   . Leaking Fluid denies.  ----------------------------------------------------------------------------------- The following portions of the patient's history were reviewed and updated as appropriate: allergies, current medications, past family history, past medical history, past social history, past surgical history and problem list. Problem list updated.  Objective  Blood pressure 120/74, weight 180 lb (81.6 kg), last menstrual period 04/29/2020, unknown if currently breastfeeding. Pregravid weight 178 lb (80.7 kg) Total Weight Gain 2 lb (0.907 kg) Urinalysis: Urine Protein    Urine Glucose    Fetal Status:           General:  Alert, oriented and cooperative. Patient is in no acute distress.  Skin: Skin is warm and dry. No rash noted.   Cardiovascular: Normal heart rate noted  Respiratory: Normal respiratory effort, no problems with respiration noted  Abdomen: Soft, gravid, appropriate for gestational age. Pain/Pressure: Absent     Pelvic:  Cervical exam deferred        Extremities: Normal range of motion.     Mental Status: Normal mood and affect. Normal behavior. Normal judgment and thought content.   Assessment   29 y.o. [id number] at [redacted]w[redacted]d by  02/03/2021, by Last Menstrual Period presenting for routine prenatal visit  Plan   pregnancy 2 Problems (from 06/22/20 to present)    Problem Noted Resolved   Supervision of normal pregnancy 06/30/2020 by Tresea Mall, CNM No   Overview Signed 07/14/2020  3:07 PM by Mirna Mires, CNM     Clinic   Prenatal Labs  Dating  Blood type: B/Positive/-- (03/14 1617)   Genetic Screen 1 Screen:    AFP:     Quad:     NIPS: Antibody:Negative (03/14 1617)  Anatomic [place]  Rubella: 3.72 (03/14 1617)  GTT Early:               Third trimester:  RPR: Non Reactive (03/14 1617)   Flu vaccine  HBsAg: Negative (03/14 1617)   TDaP vaccine                                               Rhogam: HIV: Non Reactive (03/14 1617)   Baby Food                                               GBS: (For PCN allergy, check sensitivities)  Contraception  Pap:  Circumcision    Pediatrician    Support Person                  Preterm labor symptoms and general obstetric precautions including but not limited to vaginal bleeding, contractions, leaking of fluid and fetal movement were reviewed in detail with the patient. Please refer to After Visit Summary for other counseling recommendations.  MaternT testing today.  Return in about 4 weeks (around 08/11/2020) for return OB.  Mirna Mires, CNM  07/14/2020 3:11 PM

## 2020-07-19 LAB — MATERNIT 21 PLUS CORE, BLOOD
Fetal Fraction: 6
Result (T21): NEGATIVE
Trisomy 13 (Patau syndrome): NEGATIVE
Trisomy 18 (Edwards syndrome): NEGATIVE
Trisomy 21 (Down syndrome): NEGATIVE

## 2020-08-11 ENCOUNTER — Other Ambulatory Visit: Payer: Self-pay

## 2020-08-11 ENCOUNTER — Ambulatory Visit (INDEPENDENT_AMBULATORY_CARE_PROVIDER_SITE_OTHER): Payer: Managed Care, Other (non HMO) | Admitting: Obstetrics and Gynecology

## 2020-08-11 VITALS — BP 112/66 | Wt 179.0 lb

## 2020-08-11 DIAGNOSIS — Z3A14 14 weeks gestation of pregnancy: Secondary | ICD-10-CM

## 2020-08-11 DIAGNOSIS — Z3689 Encounter for other specified antenatal screening: Secondary | ICD-10-CM

## 2020-08-11 DIAGNOSIS — Z3482 Encounter for supervision of other normal pregnancy, second trimester: Secondary | ICD-10-CM

## 2020-08-11 LAB — POCT URINALYSIS DIPSTICK OB
Glucose, UA: NEGATIVE
POC,PROTEIN,UA: NEGATIVE

## 2020-08-11 NOTE — Progress Notes (Signed)
    Routine Prenatal Care Visit  Subjective  Sherry Townsend is a 30 y.o. G3P1011 at [redacted]w[redacted]d being seen today for ongoing prenatal care.  She is currently monitored for the following issues for this low-risk pregnancy and has Nephrolithiasis and Supervision of normal pregnancy on their problem list.  ----------------------------------------------------------------------------------- Patient reports no complaints.   Contractions: Not present. Vag. Bleeding: None.  Movement: Absent. Denies leaking of fluid.  ----------------------------------------------------------------------------------- The following portions of the patient's history were reviewed and updated as appropriate: allergies, current medications, past family history, past medical history, past social history, past surgical history and problem list. Problem list updated.   Objective  Blood pressure 112/66, weight 179 lb (81.2 kg), last menstrual period 04/29/2020, unknown if currently breastfeeding. Pregravid weight 178 lb (80.7 kg) Total Weight Gain 1 lb (0.454 kg) Urinalysis:      Fetal Status: Fetal Heart Rate (bpm): 148   Movement: Absent     General:  Alert, oriented and cooperative. Patient is in no acute distress.  Skin: Skin is warm and dry. No rash noted.   Cardiovascular: Normal heart rate noted  Respiratory: Normal respiratory effort, no problems with respiration noted  Abdomen: Soft, gravid, appropriate for gestational age. Pain/Pressure: Absent     Pelvic:  Cervical exam deferred        Extremities: Normal range of motion.     ental Status: Normal mood and affect. Normal behavior. Normal judgment and thought content.     Assessment   29 y.o. G3P1011 at [redacted]w[redacted]d by  02/03/2021, by Last Menstrual Period presenting for routine prenatal visit  Plan   pregnancy 2 Problems (from 06/22/20 to present)    Problem Noted Resolved   Supervision of normal pregnancy 06/30/2020 by Tresea Mall, CNM No   Overview Addendum  08/11/2020  4:56 PM by Zipporah Plants, CNM     Nursing Staff Provider  Office Location  [facility] Dating   LMP = 8 wk [place]  Language  English Anatomy US   ordered  Flu Vaccine   declined Genetic Screen  NIPS:   Neg x3, XY  TDaP vaccine    Hgb A1C or  GTT Early : n/a Third trimester :   Rhogam   n/a   LAB RESULTS   Feeding Plan  breast Blood Type B/Positive/-- (03/14 1617)   Contraception  vasectomy Antibody Negative (03/14 1617)  Circumcision  Rubella 3.72 (03/14 1617)  Pediatrician   RPR Non Reactive (03/14 1617)   Support Person  HBsAg Negative (03/14 1617)   Prenatal Classes  HIV Non Reactive (03/14 1617)    Varicella  immune  BTL Consent  n/a GBS  (For PCN allergy, check sensitivities)        VBAC Consent  n/a Pap  05/27/19 - NILM    Hgb Electro     Covid  vax x2 CF  negative     SMA  negative              Previous Version       Second trimester precautions including but not limited to vaginal bleeding, contractions, leaking of fluid and fetal movement were reviewed in detail with the patient.    Return in about 4 weeks (around 09/08/2020) for ROB - Anatomy US order placed for [facility] in 4-6 weeks.  Zipporah Plants, CNM, MSN [facility], [facility] 08/11/2020, 4:56 PM

## 2020-09-08 ENCOUNTER — Encounter: Payer: Managed Care, Other (non HMO) | Admitting: Obstetrics and Gynecology

## 2020-09-08 ENCOUNTER — Other Ambulatory Visit: Payer: Self-pay

## 2020-09-08 ENCOUNTER — Ambulatory Visit (INDEPENDENT_AMBULATORY_CARE_PROVIDER_SITE_OTHER): Payer: Managed Care, Other (non HMO) | Admitting: Obstetrics

## 2020-09-08 VITALS — BP 120/70 | Ht 64.0 in | Wt 182.0 lb

## 2020-09-08 DIAGNOSIS — Z3482 Encounter for supervision of other normal pregnancy, second trimester: Secondary | ICD-10-CM

## 2020-09-08 DIAGNOSIS — Z3A18 18 weeks gestation of pregnancy: Secondary | ICD-10-CM

## 2020-09-08 LAB — POCT URINALYSIS DIPSTICK OB
Glucose, UA: NEGATIVE
POC,PROTEIN,UA: NEGATIVE

## 2020-09-08 NOTE — Progress Notes (Signed)
  Routine Prenatal Care Visit  Subjective  Sherry Townsend is a 30 y.o. G3P1011 at [redacted]w[redacted]d being seen today for ongoing prenatal care.  She is currently monitored for the following issues for this low-risk pregnancy and has Nephrolithiasis and Supervision of normal pregnancy on their problem list.  ----------------------------------------------------------------------------------- Patient reports no complaints.   Contractions: Not present. Vag. Bleeding: None.  Movement: Present. Leaking Fluid denies.  ----------------------------------------------------------------------------------- The following portions of the patient's history were reviewed and updated as appropriate: allergies, current medications, past family history, past medical history, past social history, past surgical history and problem list. Problem list updated.  Objective  Blood pressure 120/70, height 5\' 4"  (1.626 m), weight 182 lb (82.6 kg), last menstrual period 04/29/2020, unknown if currently breastfeeding. Pregravid weight 178 lb (80.7 kg) Total Weight Gain 4 lb (1.814 kg) Urinalysis: Urine Protein Negative  Urine Glucose Negative  Fetal Status:     Movement: Present     General:  Alert, oriented and cooperative. Patient is in no acute distress.  Skin: Skin is warm and dry. No rash noted.   Cardiovascular: Normal heart rate noted  Respiratory: Normal respiratory effort, no problems with respiration noted  Abdomen: Soft, gravid, appropriate for gestational age. Pain/Pressure: Absent     Pelvic:  Cervical exam deferred        Extremities: Normal range of motion.     Mental Status: Normal mood and affect. Normal behavior. Normal judgment and thought content.   Assessment   29 y.o. G3P1011 at [redacted]w[redacted]d by  02/03/2021, by Last Menstrual Period presenting for routine prenatal visit  Plan   pregnancy 2 Problems (from 06/22/20 to present)    Problem Noted Resolved   Supervision of normal pregnancy 06/30/2020 by 07/02/2020, CNM No   Overview Addendum 08/11/2020  4:56 PM by 10/11/2020, CNM     Nursing Staff Provider  Office Location  [facility] Dating   LMP = 8 wk Zipporah Plants  Language  English Anatomy US   ordered  Flu Vaccine   declined Genetic Screen  NIPS:   Neg x3, XY  TDaP vaccine    Hgb A1C or  GTT Early : n/a Third trimester :   Rhogam   n/a   LAB RESULTS   Feeding Plan  breast Blood Type B/Positive/-- (03/14 1617)   Contraception  vasectomy Antibody Negative (03/14 1617)  Circumcision  Rubella 3.72 (03/14 1617)  Pediatrician   RPR Non Reactive (03/14 1617)   Support Person  HBsAg Negative (03/14 1617)   Prenatal Classes  HIV Non Reactive (03/14 1617)    Varicella  immune  BTL Consent  n/a GBS  (For PCN allergy, check sensitivities)        VBAC Consent  n/a Pap  05/27/19 - NILM    Hgb Electro     Covid  vax x2 CF  negative     SMA  negative              Previous Version       Preterm labor symptoms and general obstetric precautions including but not limited to vaginal bleeding, contractions, leaking of fluid and fetal movement were reviewed in detail with the patient. Please refer to After Visit Summary for other counseling recommendations.   Return in about 4 weeks (around 10/06/2020) for return OB.  10/08/2020, CNM  09/08/2020 4:43 PM

## 2020-09-23 ENCOUNTER — Other Ambulatory Visit: Payer: Self-pay

## 2020-09-23 ENCOUNTER — Ambulatory Visit
Admission: RE | Admit: 2020-09-23 | Discharge: 2020-09-23 | Disposition: A | Discharge status: Home / Self Care | Payer: Managed Care, Other (non HMO) | Source: Ambulatory Visit | Attending: Obstetrics and Gynecology | Admitting: Obstetrics and Gynecology

## 2020-09-23 DIAGNOSIS — Z3689 Encounter for other specified antenatal screening: Secondary | ICD-10-CM | POA: Diagnosis present

## 2020-09-25 ENCOUNTER — Encounter: Payer: Self-pay | Admitting: Obstetrics & Gynecology

## 2020-09-25 ENCOUNTER — Other Ambulatory Visit: Payer: Self-pay

## 2020-09-25 ENCOUNTER — Ambulatory Visit (INDEPENDENT_AMBULATORY_CARE_PROVIDER_SITE_OTHER): Payer: Managed Care, Other (non HMO) | Admitting: Obstetrics & Gynecology

## 2020-09-25 VITALS — BP 120/80 | Wt 183.0 lb

## 2020-09-25 DIAGNOSIS — Z3A21 21 weeks gestation of pregnancy: Secondary | ICD-10-CM

## 2020-09-25 DIAGNOSIS — Z3482 Encounter for supervision of other normal pregnancy, second trimester: Secondary | ICD-10-CM

## 2020-09-25 NOTE — Patient Instructions (Signed)

## 2020-09-25 NOTE — Progress Notes (Signed)
  Subjective  Fetal Movement? yes Contractions? no Leaking Fluid? no Vaginal Bleeding? no  Objective  BP 120/80   Wt 183 lb (83 kg)   LMP 04/29/2020   BMI 31.41 kg/m  General: NAD Pumonary: no increased work of breathing Abdomen: gravid, non-tender Extremities: no edema Psychiatric: mood appropriate, affect full  Review of ULTRASOUND. I have personally reviewed images and report of recent ultrasound done at [facility]. There is a singleton gestation with subjectively normal amniotic fluid volume. The fetal biometry correlates with established dating. Detailed evaluation of the fetal anatomy was performed.The fetal anatomical survey appears within normal limits within the resolution of ultrasound as described above.  It must be noted that a normal ultrasound is unable to rule out fetal aneuploidy.    Assessment  29 y.o. G3P1011 at [redacted]w[redacted]d by  02/03/2021, by Last Menstrual Period presenting for routine prenatal visit  Plan   Problem List Items Addressed This Visit   None Visit Diagnoses    Encounter for supervision of other normal pregnancy, second trimester    -  Primary   [redacted] weeks gestation of pregnancy        PNV  pregnancy 2 Problems (from 06/22/20 to present)    Problem Noted Resolved   Supervision of normal pregnancy 06/30/2020 by Tresea Mall, CNM No   Overview Addendum 08/11/2020  4:56 PM by Zipporah Plants, CNM     Nursing Staff Provider  Office Location  [facility] Dating   LMP = 8 wk [place]  Language  English Anatomy US   ordered  Flu Vaccine   declined Genetic Screen  NIPS:   Neg x3, XY  TDaP vaccine    Hgb A1C or  GTT Early : n/a Third trimester :   Rhogam   n/a   LAB RESULTS   Feeding Plan  breast Blood Type B/Positive/-- (03/14 1617)   Contraception  vasectomy Antibody Negative (03/14 1617)  Circumcision  Rubella 3.72 (03/14 1617)  Pediatrician   RPR Non Reactive (03/14 1617)   Support Person  HBsAg Negative (03/14 1617)   Prenatal Classes  HIV Non Reactive (03/14  1617)    Varicella  immune  BTL Consent  n/a GBS  (For PCN allergy, check sensitivities)        VBAC Consent  n/a Pap  05/27/19 - NILM    Hgb Electro     Covid  vax x2 CF  negative     SMA  negative                   Annamarie Major, MD, [facility], [facility] 09/25/2020  2:25 PM

## 2020-10-26 ENCOUNTER — Other Ambulatory Visit: Payer: Self-pay

## 2020-10-26 ENCOUNTER — Ambulatory Visit (INDEPENDENT_AMBULATORY_CARE_PROVIDER_SITE_OTHER): Payer: Managed Care, Other (non HMO) | Admitting: Obstetrics and Gynecology

## 2020-10-26 ENCOUNTER — Encounter: Payer: Self-pay | Admitting: Obstetrics and Gynecology

## 2020-10-26 VITALS — BP 112/64 | Wt 182.0 lb

## 2020-10-26 DIAGNOSIS — Z131 Encounter for screening for diabetes mellitus: Secondary | ICD-10-CM

## 2020-10-26 DIAGNOSIS — Z3A25 25 weeks gestation of pregnancy: Secondary | ICD-10-CM

## 2020-10-26 DIAGNOSIS — Z3482 Encounter for supervision of other normal pregnancy, second trimester: Secondary | ICD-10-CM

## 2020-10-26 DIAGNOSIS — Z113 Encounter for screening for infections with a predominantly sexual mode of transmission: Secondary | ICD-10-CM

## 2020-10-26 LAB — POCT URINALYSIS DIPSTICK OB
Glucose, UA: NEGATIVE
POC,PROTEIN,UA: NEGATIVE

## 2020-10-26 NOTE — Progress Notes (Signed)
  Routine Prenatal Care Visit  Subjective  Sherry Townsend is a 30 y.o. G3P1011 at [redacted]w[redacted]d being seen today for ongoing prenatal care.  She is currently monitored for the following issues for this low-risk pregnancy and has Nephrolithiasis and Supervision of normal pregnancy on their problem list.  ----------------------------------------------------------------------------------- Patient reports no complaints.   Contractions: Not present. Vag. Bleeding: None.  Movement: Present. Leaking Fluid denies.  ----------------------------------------------------------------------------------- The following portions of the patient's history were reviewed and updated as appropriate: allergies, current medications, past family history, past medical history, past social history, past surgical history and problem list. Problem list updated.  Objective  Blood pressure 112/64, weight 182 lb (82.6 kg), last menstrual period 04/29/2020, unknown if currently breastfeeding. Pregravid weight 178 lb (80.7 kg) Total Weight Gain 4 lb (1.814 kg) Urinalysis: Urine Protein Negative  Urine Glucose Negative  Fetal Status: Fetal Heart Rate (bpm): 140 Fundal Height: 37 cm Movement: Present     General:  Alert, oriented and cooperative. Patient is in no acute distress.  Skin: Skin is warm and dry. No rash noted.   Cardiovascular: Normal heart rate noted  Respiratory: Normal respiratory effort, no problems with respiration noted  Abdomen: Soft, gravid, appropriate for gestational age. Pain/Pressure: Absent     Pelvic:  Cervical exam deferred        Extremities: Normal range of motion.  Edema: None  Mental Status: Normal mood and affect. Normal behavior. Normal judgment and thought content.   Assessment   29 y.o. G3P1011 at [redacted]w[redacted]d by  02/03/2021, by Last Menstrual Period presenting for routine prenatal visit  Plan   pregnancy 2 Problems (from 06/22/20 to present)     Problem Noted Resolved   Supervision of normal  pregnancy 06/30/2020 by Tresea Mall, CNM No   Overview Addendum 08/11/2020  4:56 PM by Zipporah Plants, CNM     Nursing Staff Provider  Office Location  [facility] Dating   LMP = 8 wk [place]  Language  English Anatomy US   ordered  Flu Vaccine   declined Genetic Screen  NIPS:   Neg x3, XY  TDaP vaccine    Hgb A1C or  GTT Early : n/a Third trimester :   Rhogam   n/a   LAB RESULTS   Feeding Plan  breast Blood Type B/Positive/-- (03/14 1617)   Contraception  vasectomy Antibody Negative (03/14 1617)  Circumcision  Rubella 3.72 (03/14 1617)  Pediatrician   RPR Non Reactive (03/14 1617)   Support Person  HBsAg Negative (03/14 1617)   Prenatal Classes  HIV Non Reactive (03/14 1617)    Varicella  immune  BTL Consent  n/a GBS  (For PCN allergy, check sensitivities)        VBAC Consent  n/a Pap  05/27/19 - NILM    Hgb Electro     Covid  vax x2 CF  negative     SMA  negative                  Preterm labor symptoms and general obstetric precautions including but not limited to vaginal bleeding, contractions, leaking of fluid and fetal movement were reviewed in detail with the patient. Please refer to After Visit Summary for other counseling recommendations.   Return in about 2 weeks (around 11/09/2020) for 28 week labs and Routine Prenatal Appointment.   Thomasene Mohair, MD, [facility], [facility] 10/26/2020 4:54 PM

## 2020-11-11 ENCOUNTER — Other Ambulatory Visit: Payer: Self-pay

## 2020-11-11 ENCOUNTER — Ambulatory Visit (INDEPENDENT_AMBULATORY_CARE_PROVIDER_SITE_OTHER): Payer: Managed Care, Other (non HMO) | Admitting: Obstetrics and Gynecology

## 2020-11-11 ENCOUNTER — Encounter: Payer: Self-pay | Admitting: Obstetrics and Gynecology

## 2020-11-11 ENCOUNTER — Other Ambulatory Visit: Payer: Managed Care, Other (non HMO)

## 2020-11-11 VITALS — BP 110/72 | Ht 64.0 in | Wt 182.4 lb

## 2020-11-11 DIAGNOSIS — Z3482 Encounter for supervision of other normal pregnancy, second trimester: Secondary | ICD-10-CM

## 2020-11-11 DIAGNOSIS — Z3483 Encounter for supervision of other normal pregnancy, third trimester: Secondary | ICD-10-CM

## 2020-11-11 DIAGNOSIS — Z3A28 28 weeks gestation of pregnancy: Secondary | ICD-10-CM

## 2020-11-11 DIAGNOSIS — Z131 Encounter for screening for diabetes mellitus: Secondary | ICD-10-CM

## 2020-11-11 DIAGNOSIS — Z113 Encounter for screening for infections with a predominantly sexual mode of transmission: Secondary | ICD-10-CM

## 2020-11-11 NOTE — Progress Notes (Signed)
    Routine Prenatal Care Visit  Subjective  Sherry Townsend is a 30 y.o. G3P1011 at [redacted]w[redacted]d being seen today for ongoing prenatal care.  She is currently monitored for the following issues for this low-risk pregnancy and has Nephrolithiasis and Supervision of normal pregnancy on their problem list.  ----------------------------------------------------------------------------------- Patient reports no complaints.   Contractions: Not present. Vag. Bleeding: None.  Movement: Present. Denies leaking of fluid.  ----------------------------------------------------------------------------------- The following portions of the patient's history were reviewed and updated as appropriate: allergies, current medications, past family history, past medical history, past social history, past surgical history and problem list. Problem list updated.   Objective  Blood pressure 110/72, height 5\' 4"  (1.626 m), weight 182 lb 6.4 oz (82.7 kg), last menstrual period 04/29/2020, unknown if currently breastfeeding. Pregravid weight 178 lb (80.7 kg) Total Weight Gain 4 lb 6.4 oz (1.996 kg) Urinalysis:      Fetal Status: Fetal Heart Rate (bpm): 140 Fundal Height: 28 cm Movement: Present     General:  Alert, oriented and cooperative. Patient is in no acute distress.  Skin: Skin is warm and dry. No rash noted.   Cardiovascular: Normal heart rate noted  Respiratory: Normal respiratory effort, no problems with respiration noted  Abdomen: Soft, gravid, appropriate for gestational age. Pain/Pressure: Absent     Pelvic:  Cervical exam deferred        Extremities: Normal range of motion.  Edema: None  Mental Status: Normal mood and affect. Normal behavior. Normal judgment and thought content.     Assessment   29 y.o. G3P1011 at [redacted]w[redacted]d by  02/03/2021, by Last Menstrual Period presenting for routine prenatal visit  Plan   pregnancy 2 Problems (from 06/22/20 to present)     Problem Noted Resolved   Supervision of  normal pregnancy 06/30/2020 by 07/02/2020, CNM No   Overview Addendum 11/11/2020 12:24 PM by 01/11/2021, MD     Nursing Staff Provider  Office Location  [facility] Dating   LMP = 8 wk Natale Milch  Language  English Anatomy US   ordered  Flu Vaccine   declined Genetic Screen  NIPS:   Neg x3, XY  TDaP vaccine    Hgb A1C or  GTT Early : n/a Third trimester :   Rhogam   n/a   LAB RESULTS   Feeding Plan  breast Blood Type B/Positive/-- (03/14 1617)   Contraception  vasectomy/BTL Antibody Negative (03/14 1617)  Circumcision  Rubella 3.72 (03/14 1617)  Pediatrician   RPR Non Reactive (03/14 1617)   Support Person FOB HBsAg Negative (03/14 1617)   Prenatal Classes  HIV Non Reactive (03/14 1617)    Varicella  immune  BTL Consent  n/a GBS  (For PCN allergy, check sensitivities)        VBAC Consent  n/a Pap  05/27/19 - NILM    Hgb Electro     Covid  vax x2 CF  negative     SMA  negative                  Gestational age appropriate obstetric precautions including but not limited to vaginal bleeding, contractions, leaking of fluid and fetal movement were reviewed in detail with the patient.    Return in about 2 weeks (around 11/25/2020) for ROB every 2 weeks x 4 visits.  11/27/2020 MD [facility], [facility] 11/11/2020, 12:24 PM

## 2020-11-11 NOTE — Patient Instructions (Signed)

## 2020-11-12 LAB — 28 WEEK RH+PANEL
Basophils Absolute: 0 10*3/uL (ref 0.0–0.2)
Basos: 0 %
EOS (ABSOLUTE): 0.1 10*3/uL (ref 0.0–0.4)
Eos: 1 %
Gestational Diabetes Screen: 115 mg/dL (ref 65–139)
HIV Screen 4th Generation wRfx: NONREACTIVE
Hematocrit: 31.6 % — ABNORMAL LOW (ref 34.0–46.6)
Hemoglobin: 10.7 g/dL — ABNORMAL LOW (ref 11.1–15.9)
Immature Grans (Abs): 0.1 10*3/uL (ref 0.0–0.1)
Immature Granulocytes: 1 %
Lymphocytes Absolute: 1.6 10*3/uL (ref 0.7–3.1)
Lymphs: 18 %
MCH: 29 pg (ref 26.6–33.0)
MCHC: 33.9 g/dL (ref 31.5–35.7)
MCV: 86 fL (ref 79–97)
Monocytes Absolute: 0.5 10*3/uL (ref 0.1–0.9)
Monocytes: 5 %
Neutrophils Absolute: 6.7 10*3/uL (ref 1.4–7.0)
Neutrophils: 75 %
Platelets: 247 10*3/uL (ref 150–450)
RBC: 3.69 x10E6/uL — ABNORMAL LOW (ref 3.77–5.28)
RDW: 12.4 % (ref 11.7–15.4)
RPR Ser Ql: NONREACTIVE
WBC: 8.8 10*3/uL (ref 3.4–10.8)

## 2020-11-27 ENCOUNTER — Other Ambulatory Visit: Payer: Self-pay

## 2020-11-27 ENCOUNTER — Ambulatory Visit (INDEPENDENT_AMBULATORY_CARE_PROVIDER_SITE_OTHER): Payer: Managed Care, Other (non HMO) | Admitting: Obstetrics

## 2020-11-27 VITALS — BP 118/74 | Wt 185.0 lb

## 2020-11-27 DIAGNOSIS — Z23 Encounter for immunization: Secondary | ICD-10-CM

## 2020-11-27 DIAGNOSIS — Z3483 Encounter for supervision of other normal pregnancy, third trimester: Secondary | ICD-10-CM | POA: Diagnosis not present

## 2020-11-27 DIAGNOSIS — Z3A30 30 weeks gestation of pregnancy: Secondary | ICD-10-CM

## 2020-11-27 NOTE — Progress Notes (Signed)
TDAP today. No vb. No lof.  °

## 2020-11-27 NOTE — Progress Notes (Signed)
  Routine Prenatal Care Visit  Subjective  Sherry Townsend is a 30 y.o. G3P1011 at [redacted]w[redacted]d being seen today for ongoing prenatal care.  She is currently monitored for the following issues for this low-risk pregnancy and has Nephrolithiasis and Supervision of normal pregnancy on their problem list.  ----------------------------------------------------------------------------------- Patient reports no complaints.  She is getting tdap today Contractions: Not present. Vag. Bleeding: None.  Movement: Present. Leaking Fluid denies.  ----------------------------------------------------------------------------------- The following portions of the patient's history were reviewed and updated as appropriate: allergies, current medications, past family history, past medical history, past social history, past surgical history and problem list. Problem list updated.  Objective  Blood pressure 118/74, weight 185 lb (83.9 kg), last menstrual period 04/29/2020, unknown if currently breastfeeding. Pregravid weight 178 lb (80.7 kg) Total Weight Gain 7 lb (3.175 kg) Urinalysis: Urine Protein    Urine Glucose    Fetal Status:     Movement: Present     General:  Alert, oriented and cooperative. Patient is in no acute distress.  Skin: Skin is warm and dry. No rash noted.   Cardiovascular: Normal heart rate noted  Respiratory: Normal respiratory effort, no problems with respiration noted  Abdomen: Soft, gravid, appropriate for gestational age. Pain/Pressure: Absent     Pelvic:  Cervical exam deferred        Extremities: Normal range of motion.     Mental Status: Normal mood and affect. Normal behavior. Normal judgment and thought content.   Assessment   30 y.o. G3P1011 at [redacted]w[redacted]d by  02/03/2021, by Last Menstrual Period presenting for routine prenatal visit  Plan   pregnancy 2 Problems (from 06/22/20 to present)    Problem Noted Resolved   Supervision of normal pregnancy 06/30/2020 by Tresea Mall, CNM No    Overview Addendum 11/11/2020 12:24 PM by Natale Milch, MD     Nursing Staff Provider  Office Location  [facility] Dating   LMP = 8 wk [place]  Language  English Anatomy US   ordered  Flu Vaccine   declined Genetic Screen  NIPS:   Neg x3, XY  TDaP vaccine    Hgb A1C or  GTT Early : n/a Third trimester :   Rhogam   n/a   LAB RESULTS   Feeding Plan  breast Blood Type B/Positive/-- (03/14 1617)   Contraception  vasectomy/BTL Antibody Negative (03/14 1617)  Circumcision  Rubella 3.72 (03/14 1617)  Pediatrician   RPR Non Reactive (03/14 1617)   Support Person FOB HBsAg Negative (03/14 1617)   Prenatal Classes  HIV Non Reactive (03/14 1617)    Varicella  immune  BTL Consent  n/a GBS  (For PCN allergy, check sensitivities)        VBAC Consent  n/a Pap  05/27/19 - NILM    Hgb Electro     Covid  vax x2 CF  negative     SMA  negative                  Preterm labor symptoms and general obstetric precautions including but not limited to vaginal bleeding, contractions, leaking of fluid and fetal movement were reviewed in detail with the patient. Please refer to After Visit Summary for other counseling recommendations.  Tdap given.  Return in about 2 weeks (around 12/11/2020) for return OB.  Mirna Mires, CNM  11/27/2020 2:59 PM

## 2020-12-11 ENCOUNTER — Other Ambulatory Visit: Payer: Self-pay

## 2020-12-11 ENCOUNTER — Ambulatory Visit (INDEPENDENT_AMBULATORY_CARE_PROVIDER_SITE_OTHER): Payer: Managed Care, Other (non HMO) | Admitting: Advanced Practice Midwife

## 2020-12-11 ENCOUNTER — Encounter: Payer: Self-pay | Admitting: Advanced Practice Midwife

## 2020-12-11 VITALS — BP 116/60 | Wt 184.0 lb

## 2020-12-11 DIAGNOSIS — Z23 Encounter for immunization: Secondary | ICD-10-CM

## 2020-12-11 DIAGNOSIS — Z3483 Encounter for supervision of other normal pregnancy, third trimester: Secondary | ICD-10-CM

## 2020-12-11 DIAGNOSIS — Z3A32 32 weeks gestation of pregnancy: Secondary | ICD-10-CM

## 2020-12-11 LAB — POCT URINALYSIS DIPSTICK OB
Glucose, UA: NEGATIVE
POC,PROTEIN,UA: NEGATIVE

## 2020-12-11 NOTE — Progress Notes (Signed)
  Routine Prenatal Care Visit  Subjective  Sherry Townsend is a 30 y.o. G3P1011 at [redacted]w[redacted]d being seen today for ongoing prenatal care.  She is currently monitored for the following issues for this low-risk pregnancy and has Nephrolithiasis and Supervision of normal pregnancy on their problem list.  ----------------------------------------------------------------------------------- Patient reports quality of fetal movements has changed.   Contractions: Not present. Vag. Bleeding: None.  Movement: Present. Leaking Fluid denies.  ----------------------------------------------------------------------------------- The following portions of the patient's history were reviewed and updated as appropriate: allergies, current medications, past family history, past medical history, past social history, past surgical history and problem list. Problem list updated.  Objective  Blood pressure 116/60, weight 184 lb (83.5 kg), last menstrual period 04/29/2020 Pregravid weight 178 lb (80.7 kg) Total Weight Gain 6 lb (2.722 kg) Urinalysis: Urine Protein    Urine Glucose    Fetal Status: Fetal Heart Rate (bpm): 138 Fundal Height: 33 cm Movement: Present     General:  Alert, oriented and cooperative. Patient is in no acute distress.  Skin: Skin is warm and dry. No rash noted.   Cardiovascular: Normal heart rate noted  Respiratory: Normal respiratory effort, no problems with respiration noted  Abdomen: Soft, gravid, appropriate for gestational age. Pain/Pressure: Absent     Pelvic:  Cervical exam deferred        Extremities: Normal range of motion.  Edema: None  Mental Status: Normal mood and affect. Normal behavior. Normal judgment and thought content.   Assessment   29 y.o. G3P1011 at [redacted]w[redacted]d by  02/03/2021, by Last Menstrual Period presenting for routine prenatal visit  Plan   pregnancy 2 Problems (from 06/22/20 to present)    Problem Noted Resolved   Supervision of normal pregnancy 06/30/2020 by  Tresea Mall, CNM No   Overview Addendum 12/11/2020  4:46 PM by Tresea Mall, CNM     Nursing Staff Provider  Office Location  [facility] Dating   LMP = 8 wk [place]  Language  English Anatomy US   ordered  Flu Vaccine   12/11/20 Genetic Screen  NIPS:   Neg x3, XY  TDaP vaccine   11/26/2020 Hgb A1C or  GTT Early : n/a Third trimester :   Rhogam   n/a   LAB RESULTS   Feeding Plan  breast Blood Type B/Positive/-- (03/14 1617)   Contraception  vasectomy/BTL Antibody Negative (03/14 1617)  Circumcision  Rubella 3.72 (03/14 1617)  Pediatrician   RPR Non Reactive (03/14 1617)   Support Person FOB HBsAg Negative (03/14 1617)   Prenatal Classes  HIV Non Reactive (03/14 1617)    Varicella  immune  BTL Consent  n/a GBS  (For PCN allergy, check sensitivities)        VBAC Consent  n/a Pap  05/27/19 - NILM    Hgb Electro     Covid  vax x2 CF  negative     SMA  negative                  Preterm labor symptoms and general obstetric precautions including but not limited to vaginal bleeding, contractions, leaking of fluid and fetal movement were reviewed in detail with the patient. Please refer to After Visit Summary for other counseling recommendations.   Return in about 2 weeks (around 12/25/2020) for rob.  Tresea Mall, CNM 12/11/2020 4:48 PM

## 2020-12-11 NOTE — Addendum Note (Signed)
Addended by: Donnetta Hail on: 12/11/2020 04:58 PM   Modules accepted: Orders

## 2020-12-11 NOTE — Progress Notes (Signed)
ROB- quiet movements, flu shot today

## 2020-12-22 ENCOUNTER — Ambulatory Visit (INDEPENDENT_AMBULATORY_CARE_PROVIDER_SITE_OTHER): Payer: Managed Care, Other (non HMO) | Admitting: Advanced Practice Midwife

## 2020-12-22 ENCOUNTER — Encounter: Payer: Self-pay | Admitting: Advanced Practice Midwife

## 2020-12-22 ENCOUNTER — Other Ambulatory Visit: Payer: Self-pay

## 2020-12-22 VITALS — BP 118/72 | Ht 64.0 in | Wt 188.0 lb

## 2020-12-22 DIAGNOSIS — Z3A33 33 weeks gestation of pregnancy: Secondary | ICD-10-CM

## 2020-12-22 DIAGNOSIS — Z3483 Encounter for supervision of other normal pregnancy, third trimester: Secondary | ICD-10-CM

## 2020-12-22 LAB — POCT URINALYSIS DIPSTICK OB
Glucose, UA: NEGATIVE
POC,PROTEIN,UA: NEGATIVE

## 2020-12-22 NOTE — Addendum Note (Signed)
Addended by: Clement Husbands A on: 12/22/2020 04:56 PM   Modules accepted: Orders

## 2020-12-22 NOTE — Progress Notes (Signed)
  Routine Prenatal Care Visit  Subjective  Sherry Townsend is a 30 y.o. G3P1011 at [redacted]w[redacted]d being seen today for ongoing prenatal care.  She is currently monitored for the following issues for this low-risk pregnancy and has Nephrolithiasis and Supervision of normal pregnancy on their problem list.  ----------------------------------------------------------------------------------- Patient reports no complaints.   Contractions: Not present. Vag. Bleeding: None.  Movement: Present. Leaking Fluid denies.  ----------------------------------------------------------------------------------- The following portions of the patient's history were reviewed and updated as appropriate: allergies, current medications, past family history, past medical history, past social history, past surgical history and problem list. Problem list updated.  Objective  Blood pressure 118/72, height 5\' 4"  (1.626 m), weight 188 lb (85.3 kg), last menstrual period 04/29/2020 Pregravid weight 178 lb (80.7 kg) Total Weight Gain 10 lb (4.536 kg) Urinalysis: Urine Protein    Urine Glucose    Fetal Status: Fetal Heart Rate (bpm): 137 Fundal Height: 35 cm Movement: Present     General:  Alert, oriented and cooperative. Patient is in no acute distress.  Skin: Skin is warm and dry. No rash noted.   Cardiovascular: Normal heart rate noted  Respiratory: Normal respiratory effort, no problems with respiration noted  Abdomen: Soft, gravid, appropriate for gestational age. Pain/Pressure: Absent     Pelvic:  Cervical exam deferred        Extremities: Normal range of motion.  Edema: None  Mental Status: Normal mood and affect. Normal behavior. Normal judgment and thought content.   Assessment   29 y.o. G3P1011 at [redacted]w[redacted]d by  02/03/2021, by Last Menstrual Period presenting for routine prenatal visit  Plan   pregnancy 2 Problems (from 06/22/20 to present)    Problem Noted Resolved   Supervision of normal pregnancy 06/30/2020 by  07/02/2020, CNM No   Overview Addendum 12/11/2020  4:46 PM by 02/10/2021, CNM     Nursing Staff Provider  Office Location  [facility] Dating   LMP = 8 wk Tresea Mall  Language  English Anatomy US   ordered  Flu Vaccine   12/11/20 Genetic Screen  NIPS:   Neg x3, XY  TDaP vaccine   11/26/2020 Hgb A1C or  GTT Early : n/a Third trimester :   Rhogam   n/a   LAB RESULTS   Feeding Plan  breast Blood Type B/Positive/-- (03/14 1617)   Contraception  vasectomy/BTL Antibody Negative (03/14 1617)  Circumcision  Rubella 3.72 (03/14 1617)  Pediatrician   RPR Non Reactive (03/14 1617)   Support Person FOB HBsAg Negative (03/14 1617)   Prenatal Classes  HIV Non Reactive (03/14 1617)    Varicella  immune  BTL Consent  n/a GBS  (For PCN allergy, check sensitivities)        VBAC Consent  n/a Pap  05/27/19 - NILM    Hgb Electro     Covid  vax x2 CF  negative     SMA  negative                  Preterm labor symptoms and general obstetric precautions including but not limited to vaginal bleeding, contractions, leaking of fluid and fetal movement were reviewed in detail with the patient.    Return in about 15 days (around 01/06/2021) for rob.  01/08/2021, CNM 12/22/2020 4:51 PM

## 2021-01-05 ENCOUNTER — Other Ambulatory Visit: Payer: Self-pay

## 2021-01-05 ENCOUNTER — Ambulatory Visit (INDEPENDENT_AMBULATORY_CARE_PROVIDER_SITE_OTHER): Payer: Managed Care, Other (non HMO) | Admitting: Obstetrics

## 2021-01-05 VITALS — BP 116/70 | Wt 187.6 lb

## 2021-01-05 DIAGNOSIS — Z3483 Encounter for supervision of other normal pregnancy, third trimester: Secondary | ICD-10-CM

## 2021-01-05 DIAGNOSIS — Z3A35 35 weeks gestation of pregnancy: Secondary | ICD-10-CM

## 2021-01-05 LAB — POCT URINALYSIS DIPSTICK OB
Glucose, UA: NEGATIVE
POC,PROTEIN,UA: NEGATIVE

## 2021-01-05 NOTE — Progress Notes (Signed)
  Routine Prenatal Care Visit  Subjective  Sherry Townsend is a 30 y.o. G3P1011 at [redacted]w[redacted]d being seen today for ongoing prenatal care.  She is currently monitored for the following issues for this low-risk pregnancy and has Nephrolithiasis and Supervision of normal pregnancy on their problem list.  ----------------------------------------------------------------------------------- Patient reports no complaints.   Contractions: Not present. Vag. Bleeding: None.  Movement: Present. Leaking Fluid denies.  ----------------------------------------------------------------------------------- The following portions of the patient's history were reviewed and updated as appropriate: allergies, current medications, past family history, past medical history, past social history, past surgical history and problem list. Problem list updated.  Objective  Blood pressure 116/70, weight 187 lb 9.6 oz (85.1 kg), last menstrual period 04/29/2020, unknown if currently breastfeeding. Pregravid weight 178 lb (80.7 kg) Total Weight Gain 9 lb 9.6 oz (4.355 kg) Urinalysis: Urine Protein Negative  Urine Glucose Negative  Fetal Status:     Movement: Present     General:  Alert, oriented and cooperative. Patient is in no acute distress.  Skin: Skin is warm and dry. No rash noted.   Cardiovascular: Normal heart rate noted  Respiratory: Normal respiratory effort, no problems with respiration noted  Abdomen: Soft, gravid, appropriate for gestational age. Pain/Pressure: Absent     Pelvic:  Cervical exam performed      2cms/40%/ballotable  Extremities: Normal range of motion.  Edema: None  Mental Status: Normal mood and affect. Normal behavior. Normal judgment and thought content.   Assessment   29 y.o. G3P1011 at [redacted]w[redacted]d by  02/03/2021, by Last Menstrual Period presenting for routine prenatal visit  Plan   pregnancy 2 Problems (from 06/22/20 to present)    Problem Noted Resolved   Supervision of normal pregnancy  06/30/2020 by Tresea Mall, CNM No   Overview Addendum 12/11/2020  4:46 PM by Tresea Mall, CNM     Nursing Staff Provider  Office Location  [facility] Dating   LMP = 8 wk [place]  Language  English Anatomy US   ordered  Flu Vaccine   12/11/20 Genetic Screen  NIPS:   Neg x3, XY  TDaP vaccine   11/26/2020 Hgb A1C or  GTT Early : n/a Third trimester :   Rhogam   n/a   LAB RESULTS   Feeding Plan  breast Blood Type B/Positive/-- (03/14 1617)   Contraception  vasectomy/BTL Antibody Negative (03/14 1617)  Circumcision  Rubella 3.72 (03/14 1617)  Pediatrician   RPR Non Reactive (03/14 1617)   Support Person FOB HBsAg Negative (03/14 1617)   Prenatal Classes  HIV Non Reactive (03/14 1617)    Varicella  immune  BTL Consent  n/a GBS  (For PCN allergy, check sensitivities)        VBAC Consent  n/a Pap  05/27/19 - NILM    Hgb Electro     Covid  vax x2 CF  negative     SMA  negative                  Term labor symptoms and general obstetric precautions including but not limited to vaginal bleeding, contractions, leaking of fluid and fetal movement were reviewed in detail with the patient. Please refer to After Visit Summary for other counseling recommendations.  GBS retrieved today Return in about 1 week (around 01/12/2021) for return OB.  Mirna Mires, CNM  01/05/2021 5:14 PM

## 2021-01-08 ENCOUNTER — Telehealth: Payer: Self-pay

## 2021-01-08 NOTE — Telephone Encounter (Signed)
FMLA/DISABILITY form for [facility] filled out.  Will obtain signature and process.

## 2021-01-10 LAB — CULTURE, BETA STREP (GROUP B ONLY): Strep Gp B Culture: NEGATIVE

## 2021-01-12 ENCOUNTER — Ambulatory Visit (INDEPENDENT_AMBULATORY_CARE_PROVIDER_SITE_OTHER): Payer: Managed Care, Other (non HMO) | Admitting: Advanced Practice Midwife

## 2021-01-12 ENCOUNTER — Encounter: Payer: Self-pay | Admitting: Advanced Practice Midwife

## 2021-01-12 ENCOUNTER — Other Ambulatory Visit: Payer: Self-pay

## 2021-01-12 VITALS — BP 118/72 | Wt 187.0 lb

## 2021-01-12 DIAGNOSIS — Z3A36 36 weeks gestation of pregnancy: Secondary | ICD-10-CM

## 2021-01-12 DIAGNOSIS — Z3483 Encounter for supervision of other normal pregnancy, third trimester: Secondary | ICD-10-CM

## 2021-01-12 LAB — POCT URINALYSIS DIPSTICK OB
Glucose, UA: NEGATIVE
POC,PROTEIN,UA: NEGATIVE

## 2021-01-12 NOTE — Addendum Note (Signed)
Addended by: Fortunato Curling R on: 01/12/2021 04:09 PM   Modules accepted: Orders

## 2021-01-12 NOTE — Progress Notes (Signed)
ROB - no concerns. RM 4 °

## 2021-01-12 NOTE — Progress Notes (Signed)
  Routine Prenatal Care Visit  Subjective  Sherry Townsend is a 30 y.o. G3P1011 at [redacted]w[redacted]d being seen today for ongoing prenatal care.  She is currently monitored for the following issues for this low-risk pregnancy and has Nephrolithiasis and Supervision of normal pregnancy on their problem list.  ----------------------------------------------------------------------------------- Patient reports no complaints.  She prefers spontaneous labor and is ok with 41 week IOL. Contractions: Not present. Vag. Bleeding: None.  Movement: Present. Leaking Fluid denies.  ----------------------------------------------------------------------------------- The following portions of the patient's history were reviewed and updated as appropriate: allergies, current medications, past family history, past medical history, past social history, past surgical history and problem list. Problem list updated.  Objective  Blood pressure 118/72, weight 187 lb (84.8 kg), last menstrual period 04/29/2020 Pregravid weight 178 lb (80.7 kg) Total Weight Gain 9 lb (4.082 kg) Urinalysis: Urine Protein    Urine Glucose    Fetal Status: Fetal Heart Rate (bpm): 140 Fundal Height: 38 cm Movement: Present     General:  Alert, oriented and cooperative. Patient is in no acute distress.  Skin: Skin is warm and dry. No rash noted.   Cardiovascular: Normal heart rate noted  Respiratory: Normal respiratory effort, no problems with respiration noted  Abdomen: Soft, gravid, appropriate for gestational age. Pain/Pressure: Absent     Pelvic:  Cervical exam deferred        Extremities: Normal range of motion.  Edema: None  Mental Status: Normal mood and affect. Normal behavior. Normal judgment and thought content.   Assessment   29 y.o. G3P1011 at [redacted]w[redacted]d by  02/03/2021, by Last Menstrual Period presenting for routine prenatal visit  Plan   pregnancy 2 Problems (from 06/22/20 to present)    Problem Noted Resolved   Supervision of normal  pregnancy 06/30/2020 by Tresea Mall, CNM No   Overview Addendum 12/11/2020  4:46 PM by Tresea Mall, CNM     Nursing Staff Provider  Office Location  [facility] Dating   LMP = 8 wk [place]  Language  English Anatomy US   ordered  Flu Vaccine   12/11/20 Genetic Screen  NIPS:   Neg x3, XY  TDaP vaccine   11/26/2020 Hgb A1C or  GTT Early : n/a Third trimester :   Rhogam   n/a   LAB RESULTS   Feeding Plan  breast Blood Type B/Positive/-- (03/14 1617)   Contraception  vasectomy/BTL Antibody Negative (03/14 1617)  Circumcision  Rubella 3.72 (03/14 1617)  Pediatrician   RPR Non Reactive (03/14 1617)   Support Person FOB HBsAg Negative (03/14 1617)   Prenatal Classes  HIV Non Reactive (03/14 1617)    Varicella  immune  BTL Consent  n/a GBS  (For PCN allergy, check sensitivities)        VBAC Consent  n/a Pap  05/27/19 - NILM    Hgb Electro     Covid  vax x2 CF  negative     SMA  negative                  Preterm labor symptoms and general obstetric precautions including but not limited to vaginal bleeding, contractions, leaking of fluid and fetal movement were reviewed in detail with the patient. Please refer to After Visit Summary for other counseling recommendations.   Return in about 1 week (around 01/19/2021) for rob x 3.  Tresea Mall, CNM 01/12/2021 3:59 PM

## 2021-01-19 ENCOUNTER — Encounter: Payer: Self-pay | Admitting: Advanced Practice Midwife

## 2021-01-19 ENCOUNTER — Other Ambulatory Visit: Payer: Self-pay

## 2021-01-19 ENCOUNTER — Ambulatory Visit (INDEPENDENT_AMBULATORY_CARE_PROVIDER_SITE_OTHER): Payer: Managed Care, Other (non HMO) | Admitting: Advanced Practice Midwife

## 2021-01-19 VITALS — BP 118/72 | Wt 189.0 lb

## 2021-01-19 DIAGNOSIS — Z3483 Encounter for supervision of other normal pregnancy, third trimester: Secondary | ICD-10-CM

## 2021-01-19 DIAGNOSIS — Z3A37 37 weeks gestation of pregnancy: Secondary | ICD-10-CM

## 2021-01-19 LAB — POCT URINALYSIS DIPSTICK OB
Glucose, UA: NEGATIVE
POC,PROTEIN,UA: NEGATIVE

## 2021-01-19 NOTE — Progress Notes (Signed)
ROB - request cervical exam, no concerns. RM 4

## 2021-01-19 NOTE — Progress Notes (Signed)
  Routine Prenatal Care Visit  Subjective  Sherry Townsend is a 30 y.o. G3P1011 at [redacted]w[redacted]d being seen today for ongoing prenatal care.  She is currently monitored for the following issues for this low-risk pregnancy and has Nephrolithiasis and Supervision of normal pregnancy on their problem list.  ----------------------------------------------------------------------------------- Patient reports no complaints.   Contractions: Not present. Vag. Bleeding: None.  Movement: Present. Leaking Fluid denies.  ----------------------------------------------------------------------------------- The following portions of the patient's history were reviewed and updated as appropriate: allergies, current medications, past family history, past medical history, past social history, past surgical history and problem list. Problem list updated.  Objective  Blood pressure 118/72, weight 189 lb (85.7 kg), last menstrual period 04/29/2020, unknown if currently breastfeeding. Pregravid weight 178 lb (80.7 kg) Total Weight Gain 11 lb (4.99 kg) Urinalysis: Urine Protein Negative  Urine Glucose Negative  Fetal Status: Fetal Heart Rate (bpm): 123 Fundal Height: 38 cm Movement: Present  Presentation: Vertex  General:  Alert, oriented and cooperative. Patient is in no acute distress.  Skin: Skin is warm and dry. No rash noted.   Cardiovascular: Normal heart rate noted  Respiratory: Normal respiratory effort, no problems with respiration noted  Abdomen: Soft, gravid, appropriate for gestational age. Pain/Pressure: Absent     Pelvic:  Cervical exam performed Dilation: 2 Effacement (%): 70 Station: -2  Extremities: Normal range of motion.  Edema: None  Mental Status: Normal mood and affect. Normal behavior. Normal judgment and thought content.   Assessment   30 y.o. G3P1011 at [redacted]w[redacted]d by  02/03/2021, by Last Menstrual Period presenting for routine prenatal visit  Plan   pregnancy 2 Problems (from 06/22/20 to present)      Problem Noted Resolved   Supervision of normal pregnancy 06/30/2020 by Tresea Mall, CNM No   Overview Addendum 12/11/2020  4:46 PM by Tresea Mall, CNM     Nursing Staff Provider  Office Location  [facility] Dating   LMP = 8 wk [place]  Language  English Anatomy US   ordered  Flu Vaccine   12/11/20 Genetic Screen  NIPS:   Neg x3, XY  TDaP vaccine   11/26/2020 Hgb A1C or  GTT Early : n/a Third trimester :   Rhogam   n/a   LAB RESULTS   Feeding Plan  breast Blood Type B/Positive/-- (03/14 1617)   Contraception  vasectomy/BTL Antibody Negative (03/14 1617)  Circumcision  Rubella 3.72 (03/14 1617)  Pediatrician   RPR Non Reactive (03/14 1617)   Support Person FOB HBsAg Negative (03/14 1617)   Prenatal Classes  HIV Non Reactive (03/14 1617)    Varicella  immune  BTL Consent  n/a GBS  (For PCN allergy, check sensitivities)        VBAC Consent  n/a Pap  05/27/19 - NILM    Hgb Electro     Covid  vax x2 CF  negative     SMA  negative                   Term labor symptoms and general obstetric precautions including but not limited to vaginal bleeding, contractions, leaking of fluid and fetal movement were reviewed in detail with the patient. Please refer to After Visit Summary for other counseling recommendations.   Return in about 1 week (around 01/26/2021) for rob.  Tresea Mall, CNM 01/19/2021 3:20 PM

## 2021-01-26 ENCOUNTER — Ambulatory Visit (INDEPENDENT_AMBULATORY_CARE_PROVIDER_SITE_OTHER): Payer: Managed Care, Other (non HMO) | Admitting: Obstetrics

## 2021-01-26 ENCOUNTER — Other Ambulatory Visit: Payer: Self-pay

## 2021-01-26 VITALS — BP 120/72 | Wt 188.0 lb

## 2021-01-26 DIAGNOSIS — Z3A38 38 weeks gestation of pregnancy: Secondary | ICD-10-CM

## 2021-01-26 DIAGNOSIS — Z3483 Encounter for supervision of other normal pregnancy, third trimester: Secondary | ICD-10-CM

## 2021-01-26 LAB — POCT URINALYSIS DIPSTICK OB
Glucose, UA: NEGATIVE
POC,PROTEIN,UA: NEGATIVE

## 2021-01-26 NOTE — Progress Notes (Signed)
  Routine Prenatal Care Visit  Subjective  Sherry Townsend is a 30 y.o. G3P1011 at [redacted]w[redacted]d being seen today for ongoing prenatal care.  She is currently monitored for the following issues for this low-risk pregnancy and has Nephrolithiasis and Supervision of normal pregnancy on their problem list.  ----------------------------------------------------------------------------------- Patient reports no complaints.    .  .   Pincus Large Fluid denies.  ----------------------------------------------------------------------------------- The following portions of the patient's history were reviewed and updated as appropriate: allergies, current medications, past family history, past medical history, past social history, past surgical history and problem list. Problem list updated.  Objective  Blood pressure 120/72, weight 188 lb (85.3 kg), last menstrual period 04/29/2020, unknown if currently breastfeeding. Pregravid weight 178 lb (80.7 kg) Total Weight Gain 10 lb (4.536 kg) Urinalysis: Urine Protein Negative  Urine Glucose Negative  Fetal Status:           General:  Alert, oriented and cooperative. Patient is in no acute distress.  Skin: Skin is warm and dry. No rash noted.   Cardiovascular: Normal heart rate noted  Respiratory: Normal respiratory effort, no problems with respiration noted  Abdomen: Soft, gravid, appropriate for gestational age.       Pelvic:  Cervical exam performed      very soft- 3.5cms/70%/-3. "swept"  Extremities: Normal range of motion.     Mental Status: Normal mood and affect. Normal behavior. Normal judgment and thought content.   Assessment   30 y.o. G3P1011 at [redacted]w[redacted]d by  02/03/2021, by Last Menstrual Period presenting for routine prenatal visit  Plan   pregnancy 2 Problems (from 06/22/20 to present)    Problem Noted Resolved   Supervision of normal pregnancy 06/30/2020 by Tresea Mall, CNM No   Overview Addendum 12/11/2020  4:46 PM by Tresea Mall, CNM      Nursing Staff Provider  Office Location  [facility] Dating   LMP = 8 wk [place]  Language  English Anatomy US   ordered  Flu Vaccine   12/11/20 Genetic Screen  NIPS:   Neg x3, XY  TDaP vaccine   11/26/2020 Hgb A1C or  GTT Early : n/a Third trimester :   Rhogam   n/a   LAB RESULTS   Feeding Plan  breast Blood Type B/Positive/-- (03/14 1617)   Contraception  vasectomy/BTL Antibody Negative (03/14 1617)  Circumcision  Rubella 3.72 (03/14 1617)  Pediatrician   RPR Non Reactive (03/14 1617)   Support Person FOB HBsAg Negative (03/14 1617)   Prenatal Classes  HIV Non Reactive (03/14 1617)    Varicella  immune  BTL Consent  n/a GBS  (For PCN allergy, check sensitivities) negative       VBAC Consent  n/a Pap  05/27/19 - NILM    Hgb Electro     Covid  vax x2 CF  negative     SMA  negative                  Term labor symptoms and general obstetric precautions including but not limited to vaginal bleeding, contractions, leaking of fluid and fetal movement were reviewed in detail with the patient. Please refer to After Visit Summary for other counseling recommendations.   Return in about 1 week (around 02/02/2021) for return OB.  Mirna Mires, CNM  01/26/2021 3:31 PM

## 2021-01-26 NOTE — Progress Notes (Signed)
ROB - requesting cervical exam. RM 5

## 2021-02-01 ENCOUNTER — Telehealth: Payer: Self-pay

## 2021-02-01 ENCOUNTER — Other Ambulatory Visit: Payer: Self-pay

## 2021-02-01 ENCOUNTER — Inpatient Hospital Stay
Admission: EM | Admit: 2021-02-01 | Discharge: 2021-02-03 | DRG: 807 | Disposition: A | Discharge status: Home / Self Care | Payer: Managed Care, Other (non HMO) | Attending: Obstetrics & Gynecology | Admitting: Obstetrics & Gynecology

## 2021-02-01 ENCOUNTER — Inpatient Hospital Stay: Payer: Managed Care, Other (non HMO) | Admitting: Anesthesiology

## 2021-02-01 ENCOUNTER — Encounter: Payer: Self-pay | Admitting: Obstetrics and Gynecology

## 2021-02-01 DIAGNOSIS — Z20822 Contact with and (suspected) exposure to covid-19: Secondary | ICD-10-CM | POA: Diagnosis present

## 2021-02-01 DIAGNOSIS — O4202 Full-term premature rupture of membranes, onset of labor within 24 hours of rupture: Secondary | ICD-10-CM

## 2021-02-01 DIAGNOSIS — Z880 Allergy status to penicillin: Secondary | ICD-10-CM | POA: Diagnosis not present

## 2021-02-01 DIAGNOSIS — Z3490 Encounter for supervision of normal pregnancy, unspecified, unspecified trimester: Secondary | ICD-10-CM

## 2021-02-01 DIAGNOSIS — Z3A39 39 weeks gestation of pregnancy: Secondary | ICD-10-CM | POA: Diagnosis not present

## 2021-02-01 DIAGNOSIS — O4290 Premature rupture of membranes, unspecified as to length of time between rupture and onset of labor, unspecified weeks of gestation: Secondary | ICD-10-CM | POA: Diagnosis present

## 2021-02-01 DIAGNOSIS — O4292 Full-term premature rupture of membranes, unspecified as to length of time between rupture and onset of labor: Principal | ICD-10-CM | POA: Diagnosis present

## 2021-02-01 DIAGNOSIS — O26893 Other specified pregnancy related conditions, third trimester: Secondary | ICD-10-CM | POA: Diagnosis present

## 2021-02-01 LAB — CBC
HCT: 29.8 % — ABNORMAL LOW (ref 36.0–46.0)
Hemoglobin: 10.1 g/dL — ABNORMAL LOW (ref 12.0–15.0)
MCH: 28.1 pg (ref 26.0–34.0)
MCHC: 33.9 g/dL (ref 30.0–36.0)
MCV: 83 fL (ref 80.0–100.0)
Platelets: 260 10*3/uL (ref 150–400)
RBC: 3.59 MIL/uL — ABNORMAL LOW (ref 3.87–5.11)
RDW: 13.4 % (ref 11.5–15.5)
WBC: 10.7 10*3/uL — ABNORMAL HIGH (ref 4.0–10.5)
nRBC: 0 % (ref 0.0–0.2)

## 2021-02-01 LAB — RESP PANEL BY RT-PCR (FLU A&B, COVID) ARPGX2
Influenza A by PCR: NEGATIVE
Influenza B by PCR: NEGATIVE
SARS Coronavirus 2 by RT PCR: NEGATIVE

## 2021-02-01 LAB — TYPE AND SCREEN
ABO/RH(D): B POS
Antibody Screen: NEGATIVE

## 2021-02-01 MED ORDER — LACTATED RINGERS IV SOLN: 500.0000 mL | INTRAVENOUS | Status: DC | PRN

## 2021-02-01 MED ORDER — BUTORPHANOL TARTRATE 1 MG/ML IJ SOLN: 1.0000 mg | INTRAMUSCULAR | Status: DC | PRN

## 2021-02-01 MED ORDER — OXYTOCIN-SODIUM CHLORIDE 30-0.9 UT/500ML-% IV SOLN: 1.0000 m[IU]/min | INTRAVENOUS | Status: DC

## 2021-02-01 MED ORDER — PHENYLEPHRINE 40 MCG/ML (10ML) SYRINGE FOR IV PUSH (FOR BLOOD PRESSURE SUPPORT): 80.0000 ug | PREFILLED_SYRINGE | INTRAVENOUS | Status: DC | PRN

## 2021-02-01 MED ORDER — ACETAMINOPHEN 325 MG PO TABS: 650.0000 mg | ORAL_TABLET | ORAL | Status: DC | PRN

## 2021-02-01 MED ORDER — AMMONIA AROMATIC IN INHA
RESPIRATORY_TRACT | Status: AC
  Filled 2021-02-01: qty 10

## 2021-02-01 MED ORDER — LIDOCAINE HCL (PF) 1 % IJ SOLN
INTRAMUSCULAR | Status: AC
  Filled 2021-02-01: qty 30

## 2021-02-01 MED ORDER — LACTATED RINGERS IV SOLN
500.0000 mL | Freq: Once | INTRAVENOUS | Status: AC
  Administered 2021-02-01: 500 mL via INTRAVENOUS

## 2021-02-01 MED ORDER — LACTATED RINGERS IV SOLN: INTRAVENOUS | Status: DC

## 2021-02-01 MED ORDER — OXYTOCIN-SODIUM CHLORIDE 30-0.9 UT/500ML-% IV SOLN: 2.5000 [IU]/h | INTRAVENOUS | Status: DC

## 2021-02-01 MED ORDER — TERBUTALINE SULFATE 1 MG/ML IJ SOLN: 0.2500 mg | Freq: Once | INTRAMUSCULAR | Status: DC | PRN

## 2021-02-01 MED ORDER — LIDOCAINE HCL (PF) 1 % IJ SOLN
INTRAMUSCULAR | Status: DC | PRN
  Administered 2021-02-01 (×2): 3 mL via SUBCUTANEOUS

## 2021-02-01 MED ORDER — ONDANSETRON HCL 4 MG/2ML IJ SOLN: 4.0000 mg | Freq: Four times a day (QID) | INTRAMUSCULAR | Status: DC | PRN

## 2021-02-01 MED ORDER — LIDOCAINE-EPINEPHRINE (PF) 1.5 %-1:200000 IJ SOLN
INTRAMUSCULAR | Status: DC | PRN
  Administered 2021-02-01: 3 mL via EPIDURAL

## 2021-02-01 MED ORDER — PHENYLEPHRINE 40 MCG/ML (10ML) SYRINGE FOR IV PUSH (FOR BLOOD PRESSURE SUPPORT)
80.0000 ug | PREFILLED_SYRINGE | INTRAVENOUS | Status: DC | PRN
Start: 2021-02-01 — End: 2021-02-02

## 2021-02-01 MED ORDER — MISOPROSTOL 200 MCG PO TABS
ORAL_TABLET | ORAL | Status: AC
  Filled 2021-02-01: qty 4

## 2021-02-01 MED ORDER — FENTANYL-BUPIVACAINE-NACL 0.5-0.125-0.9 MG/250ML-% EP SOLN
EPIDURAL | Status: AC
  Filled 2021-02-01: qty 250

## 2021-02-01 MED ORDER — LIDOCAINE HCL (PF) 1 % IJ SOLN: 30.0000 mL | INTRAMUSCULAR | Status: DC | PRN

## 2021-02-01 MED ORDER — SODIUM CHLORIDE 0.9 % IV SOLN
INTRAVENOUS | Status: DC | PRN
  Administered 2021-02-01: 8 mL via EPIDURAL

## 2021-02-01 MED ORDER — EPHEDRINE 5 MG/ML INJ: 10.0000 mg | INTRAVENOUS | Status: DC | PRN

## 2021-02-01 MED ORDER — OXYTOCIN 10 UNIT/ML IJ SOLN
INTRAMUSCULAR | Status: AC
  Filled 2021-02-01: qty 2

## 2021-02-01 MED ORDER — OXYTOCIN BOLUS FROM INFUSION
333.0000 mL | Freq: Once | INTRAVENOUS | Status: DC
  Administered 2021-02-02: 333 mL via INTRAVENOUS

## 2021-02-01 MED ORDER — OXYTOCIN-SODIUM CHLORIDE 30-0.9 UT/500ML-% IV SOLN
INTRAVENOUS | Status: AC
  Administered 2021-02-01: 2 m[IU]/min via INTRAVENOUS
  Filled 2021-02-01: qty 500

## 2021-02-01 MED ORDER — FENTANYL-BUPIVACAINE-NACL 0.5-0.125-0.9 MG/250ML-% EP SOLN
12.0000 mL/h | EPIDURAL | Status: DC | PRN
  Administered 2021-02-01: 12 mL/h via EPIDURAL

## 2021-02-01 MED ORDER — DIPHENHYDRAMINE HCL 50 MG/ML IJ SOLN
12.5000 mg | INTRAMUSCULAR | Status: DC | PRN
Start: 2021-02-01 — End: 2021-02-02

## 2021-02-01 NOTE — Anesthesia Procedure Notes (Signed)
Epidural Patient location during procedure: OB Start time: 02/01/2021 10:09 PM End time: 02/01/2021 10:17 PM  Staffing Anesthesiologist: Corinda Gubler, MD Performed: anesthesiologist   Preanesthetic Checklist Completed: patient identified, IV checked, site marked, risks and benefits discussed, surgical consent, monitors and equipment checked, pre-op evaluation and timeout performed  Epidural Patient position: sitting Prep: DuraPrep and site prepped and draped Patient monitoring: heart rate, continuous pulse ox and blood pressure Approach: midline Location: L3-L4 Injection technique: LOR saline  Needle:  Needle type: Tuohy  Needle gauge: 17 G Needle length: 9 cm and 9 Needle insertion depth: 5 cm Catheter type: closed end flexible Catheter size: 19 Gauge Catheter at skin depth: 10 cm Test dose: negative and 1.5% lidocaine with Epi 1:200 K  Assessment Sensory level: T10 Events: blood not aspirated, injection not painful, no injection resistance, no paresthesia and negative IV test  Additional Notes Second attempt Pt. Evaluated and documentation done after procedure finished. Patient identified. Risks/Benefits/Options discussed with patient including but not limited to bleeding, infection, nerve damage, paralysis, failed block, incomplete pain control, headache, blood pressure changes, nausea, vomiting, reactions to medication both or allergic, itching and postpartum back pain. Confirmed with bedside nurse the patient's most recent platelet count. Confirmed with patient that they are not currently taking any anticoagulation, have any bleeding history or any family history of bleeding disorders. Patient expressed understanding and wished to proceed. All questions were answered. Sterile technique was used throughout the entire procedure. Please see nursing notes for vital signs. Test dose was given through epidural catheter and negative prior to continuing to dose epidural or start  infusion. Warning signs of high block given to the patient including shortness of breath, tingling/numbness in hands, complete motor block, or any concerning symptoms with instructions to call for help. Patient was given instructions on fall risk and not to get out of bed. All questions and concerns addressed with instructions to call with any issues or inadequate analgesia.     Patient tolerated the insertion well without immediate complications.  Reason for block: procedure for painReason for block:procedure for pain

## 2021-02-01 NOTE — OB Triage Note (Signed)
Pt Sherry Townsend 30 y.o. presents to labor and delivery triage reporting leaking of fluid . Pt is a G3P1011 at [redacted]w[redacted]d . Pt states her water broke at 1100 while she was at work, describes the fluid as clear. Pt reports irregular contractions and pelvic pressure. Pt reports positive fetal movement. Pt is grossly ruptured on assessment. External FM and TOCO applied to non-tender abdomen and assessing. Initial FHR 130 . Vital signs obtained and within normal limits. Provider notified of pt.

## 2021-02-01 NOTE — H&P (Addendum)
OB History & Physical   History of Present Illness:  Chief Complaint: water broke  HPI:  Sherry Townsend is a 30 y.o. G59P1011 female at [redacted]w[redacted]d dated by LMP = 8w u/s.  Her pregnancy has been uncomplicated.    She reports some abdominal tightening/pressure.   She reports leakage of fluid this morning at 11 AM.   She denies vaginal bleeding.   She reports fetal movement.    Total weight gain for pregnancy: 4.536 kg   Obstetrical Problem List: pregnancy 2 Problems (from 06/22/20 to present)     Problem Noted Resolved   Supervision of normal pregnancy 06/30/2020 by Tresea Mall, CNM No   Overview Addendum 12/11/2020  4:46 PM by Tresea Mall, CNM     Nursing Staff Provider  Office Location  [facility] Dating   LMP = 8 wk [place]  Language  English Anatomy US   ordered  Flu Vaccine   12/11/20 Genetic Screen  NIPS:   Neg x3, XY  TDaP vaccine   11/26/2020 Hgb A1C or  GTT Early : n/a Third trimester :   Rhogam   n/a   LAB RESULTS   Feeding Plan  breast Blood Type B/Positive/-- (03/14 1617)   Contraception  vasectomy/BTL Antibody Negative (03/14 1617)  Circumcision  Rubella 3.72 (03/14 1617)  Pediatrician   RPR Non Reactive (03/14 1617)   Support Person FOB HBsAg Negative (03/14 1617)   Prenatal Classes  HIV Non Reactive (03/14 1617)    Varicella  immune  BTL Consent  n/a GBS  (For PCN allergy, check sensitivities)        VBAC Consent  n/a Pap  05/27/19 - NILM    Hgb Electro     Covid  vax x2 CF  negative     SMA  negative                  Maternal Medical History:   Past Medical History:  Diagnosis Date   Anemia    Kidney stone    Kidney stone complicating pregnancy 05/14/2015    Past Surgical History:  Procedure Laterality Date   CYSTOSCOPY W/ RETROGRADES Left 12/28/2015   Procedure: CYSTOSCOPY WITH RETROGRADE PYELOGRAM;  Surgeon: Vanna Scotland, MD;  Location: [facility];  Service: Urology;  Laterality: Left;   CYSTOSCOPY WITH STENT PLACEMENT Left 12/28/2015   Procedure:  CYSTOSCOPY WITH STENT PLACEMENT;  Surgeon: Vanna Scotland, MD;  Location: [facility];  Service: Urology;  Laterality: Left;   URETEROSCOPY WITH HOLMIUM LASER LITHOTRIPSY Left 12/28/2015   Procedure: URETEROSCOPY WITH HOLMIUM LASER LITHOTRIPSY;  Surgeon: Vanna Scotland, MD;  Location: [facility];  Service: Urology;  Laterality: Left;   WISDOM TOOTH EXTRACTION      Allergies  Allergen Reactions   Toradol [Ketorolac Tromethamine] Other (See Comments)    Numbness of legs    Prior to Admission medications   Medication Sig Start Date End Date Taking? Authorizing Provider  Prenatal Vit-Fe Fumarate-FA (MULTIVITAMIN-PRENATAL) 27-0.8 MG TABS tablet Take 1 tablet by mouth daily at 12 noon.   Yes [provider]    OB History  Gravida Para Term Preterm AB Living  3 1 1   1 1   SAB IAB Ectopic Multiple Live Births  1     0 1    # Outcome Date GA Lbr Len/2nd Weight Sex Delivery Anes PTL Lv  3 Current           2 SAB 2021          1  Term 09/20/15 [redacted]w[redacted]d 22:00 / 04:57 3600 g F Vag-Spont EPI, Local  LIV    Prenatal care site: [facility]  Social History: She  reports that she has never smoked. She has never used smokeless tobacco. She reports current alcohol use. She reports that she does not use drugs.  Family History: family history includes Bladder Cancer in her paternal grandmother; Heart disease in her maternal grandmother; Nephrolithiasis in her maternal aunt and maternal grandfather.    Review of Systems:  Review of Systems  Constitutional:  Negative for chills and fever.  HENT:  Negative for congestion, ear discharge, ear pain, hearing loss, sinus pain and sore throat.   Eyes:  Negative for blurred vision and double vision.  Respiratory:  Negative for cough, shortness of breath and wheezing.   Cardiovascular:  Negative for chest pain, palpitations and leg swelling.  Gastrointestinal:  Negative for abdominal pain, blood in stool, constipation, diarrhea, heartburn, melena,  nausea and vomiting.  Genitourinary:  Negative for dysuria, flank pain, frequency, hematuria and urgency.  Musculoskeletal:  Negative for back pain, joint pain and myalgias.  Skin:  Negative for itching and rash.  Neurological:  Negative for dizziness, tingling, tremors, sensory change, speech change, focal weakness, seizures, loss of consciousness, weakness and headaches.  Endo/Heme/Allergies:  Negative for environmental allergies. Does not bruise/bleed easily.  Psychiatric/Behavioral:  Negative for depression, hallucinations, memory loss, substance abuse and suicidal ideas. The patient is not nervous/anxious and does not have insomnia.     Physical Exam:  LMP 04/29/2020   Constitutional: Well nourished, well developed female in no acute distress.  HEENT: normal Skin: Warm and dry.  Cardiovascular: Regular rate and rhythm.   Extremity:  no edema   Respiratory: Clear to auscultation bilateral. Normal respiratory effort Abdomen: FHT present Back: no CVAT Neuro: DTRs 2+, Cranial nerves grossly intact Psych: Alert and Oriented x3. No memory deficits. Normal mood and affect.  MS: normal gait, normal bilateral lower extremity ROM/strength/stability.  Pelvic exam: Per RN Oneta Rack 3.5/70/-3 Gross rupture of clear fluid per RN  Baseline FHR: 130 beats/min   Variability: moderate   Accelerations: present   Decelerations: absent Contractions: Irritability Overall assessment: reassuring   No results found for: [id number]]  Assessment:  Sherry Townsend is a 30 y.o. G44P1011 female at [redacted]w[redacted]d with SROM.   Plan:  Admit to Labor & Delivery  CBC, T&S, Clrs, IVF GBS negative.   Fetal well-being: Category I Begin augmentation with ambulation, start pitocin titration as needed    Tresea Mall, CNM 02/01/2021 5:03 PM

## 2021-02-01 NOTE — Anesthesia Procedure Notes (Signed)
Epidural Patient location during procedure: OB Start time: 02/01/2021 9:52 PM End time: 02/01/2021 10:08 PM  Staffing Anesthesiologist: Corinda Gubler, MD Performed: anesthesiologist   Preanesthetic Checklist Completed: patient identified, IV checked, site marked, risks and benefits discussed, surgical consent, monitors and equipment checked, pre-op evaluation and timeout performed  Epidural Patient position: sitting Prep: DuraPrep Patient monitoring: heart rate, continuous pulse ox and blood pressure Approach: midline Location: L4-L5 Injection technique: LOR saline  Needle:  Needle type: Tuohy  Needle gauge: 17 G Needle length: 9 cm and 9 Needle insertion depth: 5 cm Catheter type: closed end flexible Catheter size: 19 Gauge Catheter at skin depth: 10 cm  Assessment Sensory level: T10 Events: blood aspirated, injection not painful, no injection resistance, no paresthesia and negative IV test  Additional Notes First attempt - heme in catheter; removed. Pt. Evaluated and documentation done after procedure finished. Patient identified. Risks/Benefits/Options discussed with patient including but not limited to bleeding, infection, nerve damage, paralysis, failed block, incomplete pain control, headache, blood pressure changes, nausea, vomiting, reactions to medication both or allergic, itching and postpartum back pain. Confirmed with bedside nurse the patient's most recent platelet count. Confirmed with patient that they are not currently taking any anticoagulation, have any bleeding history or any family history of bleeding disorders. Patient expressed understanding and wished to proceed. All questions were answered. Sterile technique was used throughout the entire procedure. Please see nursing notes for vital signs. Aspiration was positive for blood; no test dose given. Catheter immediately removed with tip intact.   Patient tolerated the insertion well without immediate  complications.  Reason for block: procedure for painReason for block:procedure for pain

## 2021-02-01 NOTE — Progress Notes (Signed)
Patient has been ambulating and reports some increase in intensity of contractions. No increase in frequency. Pitocin started.   Tresea Mall, CNM

## 2021-02-01 NOTE — Telephone Encounter (Signed)
Pt calling; 39wks; having a lot of urine leakage and pressure; peed herself yesterday and again today; go to ED?  [phone]  Pt states leakage does not smell like urine; is still leaking and having pressure; adv to go to L&D d/t no availability; Camilla notified.

## 2021-02-01 NOTE — Anesthesia Preprocedure Evaluation (Signed)
Anesthesia Evaluation  Patient identified by MRN, date of birth, ID band Patient awake    Reviewed: Allergy & Precautions, NPO status , Patient's Chart, lab work & pertinent test results  History of Anesthesia Complications Negative for: history of anesthetic complications  Airway Mallampati: II  TM Distance: >3 FB Neck ROM: Full    Dental no notable dental hx. (+) Teeth Intact   Pulmonary neg pulmonary ROS, neg sleep apnea, neg COPD, Patient abstained from smoking.Not current smoker,    Pulmonary exam normal breath sounds clear to auscultation       Cardiovascular Exercise Tolerance: Good METS(-) hypertension(-) CAD and (-) Past MI negative cardio ROS  (-) dysrhythmias  Rhythm:Regular Rate:Normal - Systolic murmurs    Neuro/Psych negative neurological ROS  negative psych ROS   GI/Hepatic neg GERD  ,(+)     (-) substance abuse  ,   Endo/Other  neg diabetes  Renal/GU Renal disease     Musculoskeletal   Abdominal   Peds  Hematology  (+) Blood dyscrasia, anemia ,   Anesthesia Other Findings Past Medical History: No date: Anemia No date: Kidney stone 05/14/2015: Kidney stone complicating pregnancy  Reproductive/Obstetrics (+) Pregnancy                             Anesthesia Physical Anesthesia Plan  ASA: 2  Anesthesia Plan: Epidural   Post-op Pain Management:    Induction:   PONV Risk Score and Plan: 2 and Treatment may vary due to age or medical condition and Ondansetron  Airway Management Planned: Natural Airway  Additional Equipment:   Intra-op Plan:   Post-operative Plan:   Informed Consent: I have reviewed the patients History and Physical, chart, labs and discussed the procedure including the risks, benefits and alternatives for the proposed anesthesia with the patient or authorized representative who has indicated his/her understanding and acceptance.        Plan Discussed with: Surgeon  Anesthesia Plan Comments: (Discussed R/B/A of neuraxial anesthesia technique with patient: - rare risks of spinal/epidural hematoma, nerve damage, infection - Risk of PDPH - Risk of itching - Risk of nausea and vomiting - Risk of poor block necessitating replacement of epidural. - Risk of allergic reactions. Patient voiced understanding.)        Anesthesia Quick Evaluation

## 2021-02-02 ENCOUNTER — Encounter: Payer: Managed Care, Other (non HMO) | Admitting: Obstetrics

## 2021-02-02 ENCOUNTER — Encounter: Payer: Self-pay | Admitting: Obstetrics and Gynecology

## 2021-02-02 DIAGNOSIS — Z3A39 39 weeks gestation of pregnancy: Secondary | ICD-10-CM

## 2021-02-02 DIAGNOSIS — Z3490 Encounter for supervision of normal pregnancy, unspecified, unspecified trimester: Secondary | ICD-10-CM

## 2021-02-02 LAB — CBC
HCT: 28.4 % — ABNORMAL LOW (ref 36.0–46.0)
Hemoglobin: 9.7 g/dL — ABNORMAL LOW (ref 12.0–15.0)
MCH: 28.4 pg (ref 26.0–34.0)
MCHC: 34.2 g/dL (ref 30.0–36.0)
MCV: 83.3 fL (ref 80.0–100.0)
Platelets: 217 10*3/uL (ref 150–400)
RBC: 3.41 MIL/uL — ABNORMAL LOW (ref 3.87–5.11)
RDW: 13.5 % (ref 11.5–15.5)
WBC: 16.6 10*3/uL — ABNORMAL HIGH (ref 4.0–10.5)
nRBC: 0 % (ref 0.0–0.2)

## 2021-02-02 LAB — RPR: RPR Ser Ql: NONREACTIVE

## 2021-02-02 MED ORDER — DIBUCAINE (PERIANAL) 1 % EX OINT: 1.0000 "application " | TOPICAL_OINTMENT | CUTANEOUS | Status: DC | PRN

## 2021-02-02 MED ORDER — SENNOSIDES-DOCUSATE SODIUM 8.6-50 MG PO TABS
2.0000 | ORAL_TABLET | Freq: Every day | ORAL | Status: DC
  Administered 2021-02-03: 2 via ORAL
  Filled 2021-02-02: qty 2

## 2021-02-02 MED ORDER — IBUPROFEN 600 MG PO TABS
600.0000 mg | ORAL_TABLET | Freq: Four times a day (QID) | ORAL | Status: DC
  Administered 2021-02-02 – 2021-02-03 (×4): 600 mg via ORAL
  Filled 2021-02-02 (×5): qty 1

## 2021-02-02 MED ORDER — PRENATAL MULTIVITAMIN CH
1.0000 | ORAL_TABLET | Freq: Every day | ORAL | Status: DC
  Administered 2021-02-02: 1 via ORAL
  Filled 2021-02-02: qty 1

## 2021-02-02 MED ORDER — ACETAMINOPHEN 325 MG PO TABS
650.0000 mg | ORAL_TABLET | ORAL | Status: DC | PRN
  Administered 2021-02-02: 650 mg via ORAL
  Filled 2021-02-02: qty 2

## 2021-02-02 MED ORDER — ONDANSETRON HCL 4 MG PO TABS: 4.0000 mg | ORAL_TABLET | ORAL | Status: DC | PRN

## 2021-02-02 MED ORDER — DIPHENHYDRAMINE HCL 25 MG PO CAPS: 25.0000 mg | ORAL_CAPSULE | Freq: Four times a day (QID) | ORAL | Status: DC | PRN

## 2021-02-02 MED ORDER — COCONUT OIL OIL
1.0000 "application " | TOPICAL_OIL | Status: DC | PRN
  Administered 2021-02-03: 1 via TOPICAL
  Filled 2021-02-02: qty 120

## 2021-02-02 MED ORDER — BENZOCAINE-MENTHOL 20-0.5 % EX AERO
1.0000 "application " | INHALATION_SPRAY | CUTANEOUS | Status: DC | PRN
  Administered 2021-02-02: 1 via TOPICAL
  Filled 2021-02-02: qty 56

## 2021-02-02 MED ORDER — ONDANSETRON HCL 4 MG/2ML IJ SOLN: 4.0000 mg | INTRAMUSCULAR | Status: DC | PRN

## 2021-02-02 MED ORDER — SIMETHICONE 80 MG PO CHEW: 80.0000 mg | CHEWABLE_TABLET | ORAL | Status: DC | PRN

## 2021-02-02 MED ORDER — WITCH HAZEL-GLYCERIN EX PADS: 1.0000 "application " | MEDICATED_PAD | CUTANEOUS | Status: DC | PRN

## 2021-02-02 NOTE — Lactation Note (Signed)
This note was copied from a baby's chart. Lactation Consultation Note  Patient Name: Sherry Townsend [id number] Date: 02/02/2021 Reason for consult: Follow-up assessment;Mother's request;Term Age:31 hours  Lactation visit. Mom is P2, SVD 15 hours ago. Mom BF her first child for 5 months, and provided EBM + formula for remaining 7 months.   Baby has had feedings and multiple void and stools since delivery. Last feeding was at 0955 this AM, and mom had no pain or discomfort. Baby awake after 1hr skin to skin post bath, mom attempting to feed. LC adjusting baby's position to be tummy in, nose across from nipple. Mom has great sandwich hold, encouraged rubbing nipple against nose, baby opened wide. First latch for 4 minutes with audible swallows, almost gulping sound. Baby pulled away and took a 2 minute break, then came back to same breast (L), for additional 13 minutes with audible swallows heard throughout, and baby unlatching on his own.  Reviewed with parents baby's early cues, feeding patterns and behaviors, signs of transfer, and potential for cluster feeding over night.   Maternal Data Has patient been taught Hand Expression?: Yes Does the patient have breastfeeding experience prior to this delivery?: Yes How long did the patient breastfeed?: 5 months  Feeding Mother's Current Feeding Choice: Breast Milk  LATCH Score Latch: Grasps breast easily, tongue down, lips flanged, rhythmical sucking.  Audible Swallowing: Spontaneous and intermittent  Type of Nipple: Everted at rest and after stimulation  Comfort (Breast/Nipple): Soft / non-tender  Hold (Positioning): Assistance needed to correctly position infant at breast and maintain latch.  LATCH Score: 9   Lactation Tools Discussed/Used    Interventions Interventions: Breast feeding basics reviewed;Assisted with latch;Hand express;Adjust position;Support pillows;Position options;Education  Discharge    Consult  Status Consult Status: Follow-up Date: 02/03/21 Follow-up type: Call as needed    Danford Bad 02/02/2021, 4:07 PM

## 2021-02-02 NOTE — Discharge Summary (Signed)
OB Discharge Summary     Patient Name: Sherry Townsend DOB: Oct 31, 1990 MRN: [id number]  Date of admission: 02/01/2021 Delivering Provider: Tresea Mall, CNM  Date of Delivery: 02/02/2021  Date of discharge: 02/03/2021  Admitting diagnosis: Premature rupture of membranes [O42.90] Pregnant [Z34.90] Intrauterine pregnancy: [redacted]w[redacted]d     Secondary diagnosis: None     Discharge diagnosis: Term Pregnancy Delivered                                                                                                Post partum procedures: plans interval BTL  Augmentation: Pitocin  Complications: None  Hospital course:  Onset of Labor With Vaginal Delivery      30 y.o. yo G3P1011 at [redacted]w[redacted]d was admitted in [name] on 02/01/2021. Patient had an uncomplicated labor course as follows:  Membrane Rupture Time/Date: 11:00 AM ,02/01/2021   Delivery Method:Vaginal, Spontaneous  Episiotomy: None  Lacerations:  1st degree  See delivery note for details  Patient had an uncomplicated postpartum course.  She is tolerating regular diet. Her pain is controlled with PO medication. She is ambulating and voiding without difficulty.   Patient is discharged home in stable condition on 02/03/21.  Newborn Data: Birth date:02/02/2021  Birth time:1:04 AM  Gender:Female  [name] Living status:Living  Apgars:8 ,9  Weight:3610 g   Physical exam  Vitals:   02/02/21 1205 02/02/21 1934 02/02/21 2314 02/03/21 0732  BP: 124/88 121/84 125/87 123/84  Pulse: 81 90 89 94  Resp: 17 18 18 20   Temp: 98 F (36.7 C) 98.1 F (36.7 C) 97.9 F (36.6 C) 97.8 F (36.6 C)  TempSrc: Oral Oral Oral Oral  SpO2: 99% 98% 99% 97%  Weight:      Height:       General: alert, cooperative, and no distress Lochia: appropriate Uterine Fundus: firm Incision: N/A DVT Evaluation: No evidence of DVT seen on physical exam. Negative Homan's sign.  Labs: Lab Results  Component Value Date   WBC 16.6 (H) 02/02/2021   HGB 9.7  (L) 02/02/2021   HCT 28.4 (L) 02/02/2021   MCV 83.3 02/02/2021   PLT 217 02/02/2021    Discharge instruction: per After Visit Summary.  Medications:  Allergies as of 02/03/2021       Reactions   Toradol [ketorolac Tromethamine] Other (See Comments)   Numbness of legs        Medication List     TAKE these medications    multivitamin-prenatal 27-0.8 MG Tabs tablet Take 1 tablet by mouth daily at 12 noon.        Diet: routine diet  Activity: Advance as tolerated. Pelvic rest for 6 weeks.   Outpatient follow up:  Follow-up Information     02/05/2021, CNM. Schedule an appointment as soon as possible for a visit in 6 week(s).   Specialty: Obstetrics Why: postpartum visit Contact information: [address] [place] [phone]                   Postpartum contraception: Interval Tubal Ligation Rhogam Given postpartum: Rh positive Rubella vaccine given postpartum: immune  Varicella vaccine given postpartum: immune TDaP given antepartum or postpartum: given antepartum   Newborn Delivery   Birth date/time: 02/02/2021 01:04:00 Delivery type: Vaginal, Spontaneous       Baby Feeding: Breast  Disposition:home with mother  SIGNED:  Mirna Mires, CNM 02/03/2021 10:40 AM

## 2021-02-02 NOTE — Discharge Instructions (Signed)

## 2021-02-02 NOTE — Progress Notes (Signed)
Admit Date: 02/01/2021 Today's Date: 02/02/2021  Post Partum Day 0  Subjective:  no complaints, up ad lib, voiding, and tolerating PO  Objective: Temp:  [97.8 F (36.6 C)-98.7 F (37.1 C)] 97.8 F (36.6 C) (10/25 0733) Pulse Rate:  [80-101] 95 (10/25 0733) Resp:  [14-18] 18 (10/25 0733) BP: (110-133)/(60-82) 126/80 (10/25 0733) SpO2:  [98 %-100 %] 99 % (10/25 0733) Weight:  [85 kg] 85 kg (10/24 1817)  Physical Exam:  General: alert, cooperative, and no distress Lochia: appropriate Uterine Fundus: firm Incision: none DVT Evaluation: No evidence of DVT seen on physical exam.  Recent Labs    02/01/21 1639 02/02/21 0528  HGB 10.1* 9.7*  HCT 29.8* 28.4*    Assessment/Plan: Plan for discharge tomorrow, Breastfeeding, Contraception (plans interval BTL as well as vasectomy), and Infant doing well  Will schedule Laparoscopic Tubal in 5-7 weeks w postpartum visit / preop visit as one visit in 4-5 weeks   LOS: 1 day   Sherry Townsend [facility] 02/02/2021, 9:45 AM

## 2021-02-03 MED ORDER — IBUPROFEN 600 MG PO TABS
600.0000 mg | ORAL_TABLET | Freq: Four times a day (QID) | ORAL | Status: DC
  Administered 2021-02-03: 600 mg via ORAL
  Filled 2021-02-03: qty 1

## 2021-02-03 NOTE — Lactation Note (Signed)
This note was copied from a baby's chart. Lactation Consultation Note  Patient Name: Sherry Townsend POLID'C Date: 02/03/2021 Reason for consult: Follow-up assessment;Term Age:31 hours  Lactation follow-up before anticipated discharge. Baby received circumcision this morning and has been resting well. Mom reports feedings overnight to have gone well, some readjusting needed at times to achieve a deeper latch, no pain, audible swallows.  Reviewed breastfeeding basics for days to come, growth spurts/cluster feeding, output expectations, keeping baby awake at breast, contentment signs, milk supply and demand and normal course of lactation.  Educated on breast fullness and engorgement and management of both, nipple care, softening of tissue as needed, ice for swelling and tylenol. Information given for outpatient lactation services and community support.  Maternal Data Has patient been taught Hand Expression?: Yes Does the patient have breastfeeding experience prior to this delivery?: Yes  Feeding Mother's Current Feeding Choice: Breast Milk  LATCH Score                    Lactation Tools Discussed/Used    Interventions Interventions: Education;Pre-pump if needed;Reverse pressure  Discharge Discharge Education: Engorgement and breast care;Warning signs for feeding baby;Outpatient recommendation Pump: Personal;Manual  Consult Status Consult Status: Complete    Danford Bad 02/03/2021, 12:17 PM

## 2021-02-03 NOTE — Progress Notes (Signed)
Patient discharged home with family.  Discharge instructions, when to follow up, and prescriptions reviewed with patient.  Patient verbalized understanding. Patient will be escorted out by auxiliary.   

## 2021-02-03 NOTE — Anesthesia Postprocedure Evaluation (Signed)
Anesthesia Post Note  Patient: Navie Beissel  Procedure(s) Performed: AN AD HOC LABOR EPIDURAL  Patient location during evaluation: Mother Baby Anesthesia Type: Epidural Level of consciousness: awake and alert Pain management: pain level controlled Vital Signs Assessment: post-procedure vital signs reviewed and stable Respiratory status: spontaneous breathing, nonlabored ventilation and respiratory function stable Cardiovascular status: stable Postop Assessment: no headache, no backache and epidural receding Anesthetic complications: no   No notable events documented.   Last Vitals:  Vitals:   02/02/21 2314 02/03/21 0732  BP: 125/87 123/84  Pulse: 89 94  Resp: 18 20  Temp: 36.6 C 36.6 C  SpO2: 99% 97%    Last Pain:  Vitals:   02/03/21 0800  TempSrc:   PainSc: 0-No pain                 Corinda Gubler

## 2021-03-01 ENCOUNTER — Other Ambulatory Visit: Payer: Self-pay | Admitting: Obstetrics & Gynecology

## 2021-03-02 ENCOUNTER — Ambulatory Visit: Payer: Managed Care, Other (non HMO) | Admitting: Obstetrics and Gynecology

## 2021-03-02 ENCOUNTER — Encounter: Payer: Self-pay | Admitting: Obstetrics and Gynecology

## 2021-03-02 ENCOUNTER — Other Ambulatory Visit: Payer: Self-pay

## 2021-03-02 VITALS — BP 118/64 | Ht 64.0 in | Wt 159.0 lb

## 2021-03-02 DIAGNOSIS — Z30011 Encounter for initial prescription of contraceptive pills: Secondary | ICD-10-CM

## 2021-03-02 MED ORDER — NORETHIN-ETH ESTRAD-FE BIPHAS 1 MG-10 MCG / 10 MCG PO TABS: 1.0000 | ORAL_TABLET | Freq: Every day | ORAL | 3 refills | Status: DC

## 2021-03-10 NOTE — Progress Notes (Signed)
Obstetrics & Gynecology Office Visit   Chief Complaint:  Chief Complaint  Patient presents with   Contraception    Discuss options for OCP, and return to work. RM 4    History of Present Illness: Patient is a 29 y.o. [id number] presenting for contraception consult.  She is currently on none and desiring to start  was intiailly interested in BTL but given unexpected death of newborn not willing to commit to somethind permanent at this time .  She has a past medical history significant for no contraindication to estrogen.  She specifically denies a history of migraine with aura, chronic hypertension, history of DVT/PE, and smoking.  Reported Patient's last menstrual period was 02/02/2021.Marland Kitchen    Her and her husband are doing joint counseling, grieving appropriately .  Review of Systems: Review of Systems  Constitutional: Negative.   Gastrointestinal: Negative.   Genitourinary: Negative.     Past Medical History:  Past Medical History:  Diagnosis Date   Anemia    Kidney stone    Kidney stone complicating pregnancy 05/14/2015    Past Surgical History:  Past Surgical History:  Procedure Laterality Date   CYSTOSCOPY W/ RETROGRADES Left 12/28/2015   Procedure: CYSTOSCOPY WITH RETROGRADE PYELOGRAM;  Surgeon: Vanna Scotland, MD;  Location: [facility];  Service: Urology;  Laterality: Left;   CYSTOSCOPY WITH STENT PLACEMENT Left 12/28/2015   Procedure: CYSTOSCOPY WITH STENT PLACEMENT;  Surgeon: Vanna Scotland, MD;  Location: [facility];  Service: Urology;  Laterality: Left;   URETEROSCOPY WITH HOLMIUM LASER LITHOTRIPSY Left 12/28/2015   Procedure: URETEROSCOPY WITH HOLMIUM LASER LITHOTRIPSY;  Surgeon: Vanna Scotland, MD;  Location: [facility];  Service: Urology;  Laterality: Left;   WISDOM TOOTH EXTRACTION      Gynecologic History: Patient's last menstrual period was 02/02/2021.  Obstetric History: [id number]  Family History:  Family History  Problem Relation Age of Onset   Nephrolithiasis  Maternal Grandfather    Nephrolithiasis Maternal Aunt    Heart disease Maternal Grandmother    Bladder Cancer Paternal Grandmother     Social History:  Social History   Socioeconomic History   Marital status: Married    Spouse name: Not on file   Number of children: Not on file   Years of education: Not on file   Highest education level: Not on file  Occupational History   Not on file  Tobacco Use   Smoking status: Never   Smokeless tobacco: Never  Vaping Use   Vaping Use: Never used  Substance and Sexual Activity   Alcohol use: Yes    Alcohol/week: 0.0 standard drinks    Comment: OCC   Drug use: No   Sexual activity: Yes    Birth control/protection: None  Other Topics Concern   Not on file  Social History Narrative   Not on file   Social Determinants of Health   Financial Resource Strain: Not on file  Food Insecurity: Not on file  Transportation Needs: Not on file  Physical Activity: Not on file  Stress: Not on file  Social Connections: Not on file  Intimate Partner Violence: Not on file    Allergies:  Allergies  Allergen Reactions   Toradol [Ketorolac Tromethamine] Other (See Comments)    Numbness of legs    Medications: Prior to Admission medications   Medication Sig Start Date End Date Taking? Authorizing Provider  Norethindrone-Ethinyl Estradiol-Fe Biphas (LO LOESTRIN FE) 1 MG-10 MCG / 10 MCG tablet Take 1 tablet by mouth daily. 03/02/21  Yes Vena Austria, MD  Prenatal Vit-Fe Fumarate-FA (MULTIVITAMIN-PRENATAL) 27-0.8 MG TABS tablet Take 1 tablet by mouth daily at 12 noon. Patient not taking: Reported on 03/02/2021    [provider]    Physical Exam Vitals:  Vitals:   03/02/21 1347  BP: 118/64   Patient's last menstrual period was 02/02/2021.  General: NAD HEENT: normocephalic, anicteric Pulmonary: No increased work of breathing Neurologic: Grossly intact  Assessment: 30 y.o. T2I7124 contraception follow up  Plan: Problem  List Items Addressed This Visit   None Visit Diagnoses     Oral contraception initiation    -  Primary      1) Contraception consuls - Patient plans OCPs for contraception. Pros and cons and systemic estrogen discussed with patient. She is not breast feeding, nor does she have any other medical contra-indications to estrogen use as listed in WHO guidelines for contraceptive use.  While effective at preventing pregnancy combination oral contraceptive pills do not prevent transmission of sexually transmitted diseases and use of barrier methods for this purpose was discussed.   2) A total of 15 minutes were spent in face-to-face contact with the patient during this encounter with over half of that time devoted to counseling and coordination of care.  3) We had a lengthy discussion about postpartum depression.  SHe is due for her 6 week follow up in 3 weeks, I offered the [place] office as I anticipate it will be emotionally easier.   4)  Return in about 3 weeks (around 03/23/2021) for 2-3 week 6 week postpartum ([place] office).   Vena Austria, MD, [facility], [facility] 03/10/2021, 7:47 PM

## 2021-03-15 ENCOUNTER — Encounter: Payer: Self-pay | Admitting: Obstetrics and Gynecology

## 2021-03-25 ENCOUNTER — Ambulatory Visit (INDEPENDENT_AMBULATORY_CARE_PROVIDER_SITE_OTHER): Payer: Managed Care, Other (non HMO) | Admitting: Obstetrics and Gynecology

## 2021-03-25 ENCOUNTER — Encounter: Payer: Self-pay | Admitting: Obstetrics and Gynecology

## 2021-03-25 ENCOUNTER — Other Ambulatory Visit: Payer: Self-pay

## 2021-03-25 DIAGNOSIS — F411 Generalized anxiety disorder: Secondary | ICD-10-CM

## 2021-03-25 MED ORDER — NORETHIN-ETH ESTRAD-FE BIPHAS 1 MG-10 MCG / 10 MCG PO TABS: 1.0000 | ORAL_TABLET | Freq: Every day | ORAL | 3 refills | Status: DC

## 2021-03-25 MED ORDER — ESCITALOPRAM OXALATE 10 MG PO TABS: 10.0000 mg | ORAL_TABLET | Freq: Every day | ORAL | 3 refills | Status: DC

## 2021-03-25 MED ORDER — HYDROXYZINE HCL 25 MG PO TABS: 25.0000 mg | ORAL_TABLET | Freq: Four times a day (QID) | ORAL | 2 refills | Status: DC | PRN

## 2021-03-25 NOTE — Progress Notes (Signed)
Postpartum Visit  Chief Complaint:  Chief Complaint  Patient presents with   Follow-up    Discuss anxiety - Procedure RM    History of Present Illness: Patient is a 30 y.o. [id number] presents for postpartum visit.  Date of delivery: 02/02/21 Type of delivery: Vaginal delivery - Vacuum or forceps assisted  no Episiotomy No.  Laceration: yes 1st degree no repair Pregnancy or labor problems:  no Any problems since the delivery:  yes, infant passed away first week of life  Newborn Details:  SINGLETON :  1. BabyGender female. Birth weight: 7lbs 15oz Maternal Details:  Contraception after delivery: Yes  Any bowel or bladder issues: No  Post partum depression/anxiety noted:  yes Edinburgh Post-Partum Depression Score:14 Date of last PAP: 05/27/2019  NILM    Review of Systems: Review of Systems  Constitutional: Negative.   Gastrointestinal: Negative.   Genitourinary: Negative.   Psychiatric/Behavioral:  Positive for depression. Negative for hallucinations, memory loss, substance abuse and suicidal ideas. The patient is nervous/anxious. The patient does not have insomnia.    The following portions of the patient's history were reviewed and updated as appropriate: allergies, current medications, past family history, past medical history, past social history, past surgical history, and problem list.  Past Medical History:  Past Medical History:  Diagnosis Date   Anemia    Kidney stone    Kidney stone complicating pregnancy 05/14/2015    Past Surgical History:  Past Surgical History:  Procedure Laterality Date   CYSTOSCOPY W/ RETROGRADES Left 12/28/2015   Procedure: CYSTOSCOPY WITH RETROGRADE PYELOGRAM;  Surgeon: Vanna Scotland, MD;  Location: [facility];  Service: Urology;  Laterality: Left;   CYSTOSCOPY WITH STENT PLACEMENT Left 12/28/2015   Procedure: CYSTOSCOPY WITH STENT PLACEMENT;  Surgeon: Vanna Scotland, MD;  Location: [facility];  Service: Urology;  Laterality: Left;    URETEROSCOPY WITH HOLMIUM LASER LITHOTRIPSY Left 12/28/2015   Procedure: URETEROSCOPY WITH HOLMIUM LASER LITHOTRIPSY;  Surgeon: Vanna Scotland, MD;  Location: [facility];  Service: Urology;  Laterality: Left;   WISDOM TOOTH EXTRACTION      Family History:  Family History  Problem Relation Age of Onset   Nephrolithiasis Maternal Grandfather    Nephrolithiasis Maternal Aunt    Heart disease Maternal Grandmother    Bladder Cancer Paternal Grandmother     Social History:  Social History   Socioeconomic History   Marital status: Married    Spouse name: Not on file   Number of children: Not on file   Years of education: Not on file   Highest education level: Not on file  Occupational History   Not on file  Tobacco Use   Smoking status: Never   Smokeless tobacco: Never  Vaping Use   Vaping Use: Never used  Substance and Sexual Activity   Alcohol use: Yes    Alcohol/week: 0.0 standard drinks    Comment: OCC   Drug use: No   Sexual activity: Yes    Birth control/protection: Pill  Other Topics Concern   Not on file  Social History Narrative   Not on file   Social Determinants of Health   Financial Resource Strain: Not on file  Food Insecurity: Not on file  Transportation Needs: Not on file  Physical Activity: Not on file  Stress: Not on file  Social Connections: Not on file  Intimate Partner Violence: Not on file    Allergies:  Allergies  Allergen Reactions   Toradol [Ketorolac Tromethamine] Other (See Comments)  Numbness of legs    Medications: Prior to Admission medications   Medication Sig Start Date End Date Taking? Authorizing Provider  Norethindrone-Ethinyl Estradiol-Fe Biphas (LO LOESTRIN FE) 1 MG-10 MCG / 10 MCG tablet Take 1 tablet by mouth daily. 03/02/21   Vena Austria, MD  Prenatal Vit-Fe Fumarate-FA (MULTIVITAMIN-PRENATAL) 27-0.8 MG TABS tablet Take 1 tablet by mouth daily at 12 noon. Patient not taking: Reported on 03/02/2021    [provider]    Physical Exam Blood pressure 129/88, height 5\' 4"  (1.626 m), weight 156 lb (70.8 kg), unknown if currently breastfeeding.  General: NAD HEENT: normocephalic, anicteric Pulmonary: No increased work of breathing Extremities: no edema, erythema, or tenderness Neurologic: Grossly intact Psychiatric: mood appropriate, affect full   Edinburgh Postnatal Depression Scale - 03/25/21 0829       Edinburgh Postnatal Depression Scale:  In the Past 7 Days   I have been able to laugh and see the funny side of things. 1    I have looked forward with enjoyment to things. 1    I have blamed myself unnecessarily when things went wrong. 1    I have been anxious or worried for no good reason. 2    I have felt scared or panicky for no good reason. 1    Things have been getting on top of me. 2    I have been so unhappy that I have had difficulty sleeping. 2    I have felt sad or miserable. 2    I have been so unhappy that I have been crying. 2    The thought of harming myself has occurred to me. 0    Edinburgh Postnatal Depression Scale Total 14             Edinburgh Postnatal Depression Scale - 03/25/21 0829       03/27/21 Postnatal Depression Scale:  In the Past 7 Days   I have been able to laugh and see the funny side of things. 1    I have looked forward with enjoyment to things. 1    I have blamed myself unnecessarily when things went wrong. 1    I have been anxious or worried for no good reason. 2    I have felt scared or panicky for no good reason. 1    Things have been getting on top of me. 2    I have been so unhappy that I have had difficulty sleeping. 2    I have felt sad or miserable. 2    I have been so unhappy that I have been crying. 2    The thought of harming myself has occurred to me. 0    Edinburgh Postnatal Depression Scale Total 14              Assessment: 30 y.o. 38 presenting for 6 week postpartum visit  Plan: Problem List Items  Addressed This Visit   None Visit Diagnoses     6 weeks postpartum follow-up    -  Primary        1) Contraception - Education given regarding options for contraception.  Patient plans on OCP (estrogen/progesterone) for contraception.  2)  Pap - ASCCP guidelines and rational discussed.  ASCCP guidelines and rational discussed.  Patient opts for every 3 years screening interval  3) Patient underwent screening for postpartum depression  - start lexapro 10mg  po daily and prn vistaril - continue counseling  4) Return in about 2 weeks (around  04/08/2021) for medication follow up [place] [facility]) ok to overbook.   Vena Austria, MD, [facility], [facility] 03/25/2021, 9:00 AM

## 2021-04-14 ENCOUNTER — Ambulatory Visit (INDEPENDENT_AMBULATORY_CARE_PROVIDER_SITE_OTHER): Payer: Managed Care, Other (non HMO) | Admitting: Obstetrics and Gynecology

## 2021-04-14 ENCOUNTER — Other Ambulatory Visit: Payer: Self-pay

## 2021-04-14 ENCOUNTER — Encounter: Payer: Self-pay | Admitting: Obstetrics and Gynecology

## 2021-04-14 VITALS — BP 128/87 | Ht 64.0 in | Wt 154.0 lb

## 2021-04-14 DIAGNOSIS — F32A Depression, unspecified: Secondary | ICD-10-CM

## 2021-04-14 DIAGNOSIS — F4321 Adjustment disorder with depressed mood: Secondary | ICD-10-CM

## 2021-04-14 DIAGNOSIS — Z634 Disappearance and death of family member: Secondary | ICD-10-CM | POA: Diagnosis not present

## 2021-04-14 DIAGNOSIS — F418 Other specified anxiety disorders: Secondary | ICD-10-CM | POA: Diagnosis not present

## 2021-04-14 DIAGNOSIS — F419 Anxiety disorder, unspecified: Secondary | ICD-10-CM

## 2021-04-14 MED ORDER — ESCITALOPRAM OXALATE 20 MG PO TABS: 20.0000 mg | ORAL_TABLET | Freq: Every day | ORAL | 2 refills | Status: DC

## 2021-04-14 NOTE — Progress Notes (Signed)
Obstetrics & Gynecology Office Visit   Chief Complaint:  Chief Complaint  Patient presents with   Follow-up    Anxiety - RM 2    History of Present Illness: The patient is a 31 y.o. female presenting initial evaluation for symptoms of anxiety and depression.  The patient is currently taking Lexapro 10mg  po daily and vistaril 25mg  po prn for the management of her symptoms.  She has not had any recent situational stressors.  Remains in therapy after seeing a counselor after loss of newborn in October.  She reports symptoms of anhedonia, irritability, decreased appetite, feelings of guilt, feelings of worthlessness, and suicidal ideation.  She denies insomnia, risk taking behavior, increased appetite, social anxiety, agorophobia, suicidal ideation, homicidal ideation, auditory hallucinations, and visual hallucinations. Symptoms have improved since last visit.     Review of Systems: Review of Systems  Constitutional: Negative.   Gastrointestinal:  Negative for nausea.  Neurological:  Negative for headaches.  Psychiatric/Behavioral:  Positive for depression. Negative for hallucinations, memory loss, substance abuse and suicidal ideas. The patient is not nervous/anxious and does not have insomnia.     Past Medical History:  Past Medical History:  Diagnosis Date   Anemia    Kidney stone    Kidney stone complicating pregnancy 05/14/2015    Past Surgical History:  Past Surgical History:  Procedure Laterality Date   CYSTOSCOPY W/ RETROGRADES Left 12/28/2015   Procedure: CYSTOSCOPY WITH RETROGRADE PYELOGRAM;  Surgeon: 07/12/2015, MD;  Location: [facility];  Service: Urology;  Laterality: Left;   CYSTOSCOPY WITH STENT PLACEMENT Left 12/28/2015   Procedure: CYSTOSCOPY WITH STENT PLACEMENT;  Surgeon: Vanna Scotland, MD;  Location: [facility];  Service: Urology;  Laterality: Left;   URETEROSCOPY WITH HOLMIUM LASER LITHOTRIPSY Left 12/28/2015   Procedure: URETEROSCOPY WITH HOLMIUM LASER  LITHOTRIPSY;  Surgeon: Vanna Scotland, MD;  Location: [facility];  Service: Urology;  Laterality: Left;   WISDOM TOOTH EXTRACTION      Gynecologic History: No LMP recorded. (Menstrual status: Oral contraceptives).  Obstetric History: 12/30/2015  Family History:  Family History  Problem Relation Age of Onset   Nephrolithiasis Maternal Grandfather    Nephrolithiasis Maternal Aunt    Heart disease Maternal Grandmother    Bladder Cancer Paternal Grandmother     Social History:  Social History   Socioeconomic History   Marital status: Married    Spouse name: Not on file   Number of children: Not on file   Years of education: Not on file   Highest education level: Not on file  Occupational History   Not on file  Tobacco Use   Smoking status: Never   Smokeless tobacco: Never  Vaping Use   Vaping Use: Never used  Substance and Sexual Activity   Alcohol use: Yes    Alcohol/week: 0.0 standard drinks    Comment: OCC   Drug use: No   Sexual activity: Yes    Birth control/protection: Pill  Other Topics Concern   Not on file  Social History Narrative   Not on file   Social Determinants of Health   Financial Resource Strain: Not on file  Food Insecurity: Not on file  Transportation Needs: Not on file  Physical Activity: Not on file  Stress: Not on file  Social Connections: Not on file  Intimate Partner Violence: Not on file    Allergies:  Allergies  Allergen Reactions   Toradol [Ketorolac Tromethamine] Other (See Comments)    Numbness of legs  Medications: Prior to Admission medications   Medication Sig Start Date End Date Taking? Authorizing Provider  escitalopram (LEXAPRO) 20 MG tablet Take 1 tablet (20 mg total) by mouth daily. 04/14/21   Vena Austria, MD  hydrOXYzine (ATARAX) 25 MG tablet Take 1 tablet (25 mg total) by mouth every 6 (six) hours as needed for anxiety. 03/25/21   Vena Austria, MD  Norethindrone-Ethinyl Estradiol-Fe Biphas (LO LOESTRIN FE) 1  MG-10 MCG / 10 MCG tablet Take 1 tablet by mouth daily. 03/25/21   Vena Austria, MD  Prenatal Vit-Fe Fumarate-FA (MULTIVITAMIN-PRENATAL) 27-0.8 MG TABS tablet Take 1 tablet by mouth daily at 12 noon. Patient not taking: Reported on 03/02/2021    [provider]    Physical Exam Vitals:  Vitals:   04/14/21 0926  BP: 128/87   No LMP recorded. (Menstrual status: Oral contraceptives).  General: NAD HEENT: normocephalic, anicteric Pulmonary: No increased work of breathing Neurologic: Grossly intact Psychiatric: mood appropriate, affect full    GAD 7 : Generalized Anxiety Score 04/14/2021  Nervous, Anxious, on Edge 1  Control/stop worrying 1  Worry too much - different things 1  Trouble relaxing 1  Restless 0  Easily annoyed or irritable 1  Afraid - awful might happen 1  Total GAD 7 Score 6  Anxiety Difficulty Not difficult at all    Depression screen [facility] 2/9 04/14/2021  Decreased Interest 1  Down, Depressed, Hopeless 1  PHQ - 2 Score 2  Altered sleeping 2  Tired, decreased energy 2  Change in appetite 3  Feeling bad or failure about yourself  2  Trouble concentrating 1  Moving slowly or fidgety/restless 1  Suicidal thoughts 1  PHQ-9 Score 14  Difficult doing work/chores Not difficult at all    Depression screen [facility] 2/9 04/14/2021  Decreased Interest 1  Down, Depressed, Hopeless 1  PHQ - 2 Score 2  Altered sleeping 2  Tired, decreased energy 2  Change in appetite 3  Feeling bad or failure about yourself  2  Trouble concentrating 1  Moving slowly or fidgety/restless 1  Suicidal thoughts 1  PHQ-9 Score 14  Difficult doing work/chores Not difficult at all     Assessment: 31 y.o. [id number] medication follow depression/anxiety  Plan: Problem List Items Addressed This Visit   None Visit Diagnoses     Depression with anxiety    -  Primary   Grief at loss of child           1) Anxiety/Depression - good initial response to starting Lexapro 10mg  po  daily 2 weeks ago.  No side-effects noted.  Increase Lexapro dose to 20mg  po daily - encouraged to continue counseling, she has a good relationship to her counselor who has also gone through the loss of a child - discussed addition of Wellbutrin XR if continued symptoms after Lexapro dose increase  2) Thyroid and B12 screen has not been obtained previously  3) A total of 17 minutes were spent in face-to-face contact with the patient during this encounter with over half of that time devoted to counseling and coordination of care.  4) Return in about 13 days (around 04/27/2021) for medication follow up phone.     , MD, 04/29/2021 [facility], [facility] 04/14/2021, 12:33 PM

## 2021-04-14 NOTE — Progress Notes (Unsigned)
Obstetrics & Gynecology Office Visit   Chief Complaint:  Chief Complaint  Patient presents with   Follow-up    Anxiety - no concerns. RM 2    History of Present Illness: The patient is a 31 y.o. female presenting follow up for symptoms of anxiety and depression.  The patient is currently taking Lexapro 10 and vistaril prn for the management of her symptoms.  She has had any recent situational stressors.  She reports symptoms of anhedonia, irritability, decreased appetite, feelings of guilt, and feelings of worthlessness.  She denies insomnia, risk taking behavior, increased appetite, social anxiety, agorophobia, suicidal ideation, homicidal ideation, auditory hallucinations, and visual hallucinations. Symptoms have improved since last visit.      Past Medical History:  Past Medical History:  Diagnosis Date   Anemia    Kidney stone    Kidney stone complicating pregnancy 05/14/2015    Past Surgical History:  Past Surgical History:  Procedure Laterality Date   CYSTOSCOPY W/ RETROGRADES Left 12/28/2015   Procedure: CYSTOSCOPY WITH RETROGRADE PYELOGRAM;  Surgeon: Vanna Scotland, MD;  Location: [facility];  Service: Urology;  Laterality: Left;   CYSTOSCOPY WITH STENT PLACEMENT Left 12/28/2015   Procedure: CYSTOSCOPY WITH STENT PLACEMENT;  Surgeon: Vanna Scotland, MD;  Location: [facility];  Service: Urology;  Laterality: Left;   URETEROSCOPY WITH HOLMIUM LASER LITHOTRIPSY Left 12/28/2015   Procedure: URETEROSCOPY WITH HOLMIUM LASER LITHOTRIPSY;  Surgeon: Vanna Scotland, MD;  Location: [facility];  Service: Urology;  Laterality: Left;   WISDOM TOOTH EXTRACTION      Gynecologic History: No LMP recorded. (Menstrual status: Oral contraceptives).  Obstetric History: [id number]  Family History:  Family History  Problem Relation Age of Onset   Nephrolithiasis Maternal Grandfather    Nephrolithiasis Maternal Aunt    Heart disease Maternal Grandmother    Bladder Cancer Paternal Grandmother      Social History:  Social History   Socioeconomic History   Marital status: Married    Spouse name: Not on file   Number of children: Not on file   Years of education: Not on file   Highest education level: Not on file  Occupational History   Not on file  Tobacco Use   Smoking status: Never   Smokeless tobacco: Never  Vaping Use   Vaping Use: Never used  Substance and Sexual Activity   Alcohol use: Yes    Alcohol/week: 0.0 standard drinks    Comment: OCC   Drug use: No   Sexual activity: Yes    Birth control/protection: Pill  Other Topics Concern   Not on file  Social History Narrative   Not on file   Social Determinants of Health   Financial Resource Strain: Not on file  Food Insecurity: Not on file  Transportation Needs: Not on file  Physical Activity: Not on file  Stress: Not on file  Social Connections: Not on file  Intimate Partner Violence: Not on file    Allergies:  Allergies  Allergen Reactions   Toradol [Ketorolac Tromethamine] Other (See Comments)    Numbness of legs    Medications: Prior to Admission medications   Medication Sig Start Date End Date Taking? Authorizing Provider  escitalopram (LEXAPRO) 10 MG tablet Take 1 tablet (10 mg total) by mouth daily. 03/25/21   Vena Austria, MD  hydrOXYzine (ATARAX) 25 MG tablet Take 1 tablet (25 mg total) by mouth every 6 (six) hours as needed for anxiety. 03/25/21   Vena Austria, MD  Norethindrone-Ethinyl Estradiol-Fe Biphas (  LO LOESTRIN FE) 1 MG-10 MCG / 10 MCG tablet Take 1 tablet by mouth daily. 03/25/21   Vena Austria, MD  Prenatal Vit-Fe Fumarate-FA (MULTIVITAMIN-PRENATAL) 27-0.8 MG TABS tablet Take 1 tablet by mouth daily at 12 noon. Patient not taking: Reported on 03/02/2021    [provider]    Physical Exam Vitals: unknown if currently breastfeeding.    No LMP recorded. (Menstrual status: Oral contraceptives).  General: NAD HEENT: normocephalic,  anicteric Pulmonary: No increased work of breathing Neurologic: Grossly intact Psychiatric: mood appropriate, affect full    GAD 7 : Generalized Anxiety Score 04/14/2021  Nervous, Anxious, on Edge 1  Control/stop worrying 1  Worry too much - different things 1  Trouble relaxing 1  Restless 0  Easily annoyed or irritable 1  Afraid - awful might happen 1  Total GAD 7 Score 6  Anxiety Difficulty Not difficult at all    Depression screen [facility] 2/9 04/14/2021  Decreased Interest 1  Down, Depressed, Hopeless 1  PHQ - 2 Score 2  Altered sleeping 2  Tired, decreased energy 2  Change in appetite 3  Feeling bad or failure about yourself  2  Trouble concentrating 1  Moving slowly or fidgety/restless 1  Suicidal thoughts 1  PHQ-9 Score 14  Difficult doing work/chores Not difficult at all    Depression screen [facility] 2/9 04/14/2021  Decreased Interest 1  Down, Depressed, Hopeless 1  PHQ - 2 Score 2  Altered sleeping 2  Tired, decreased energy 2  Change in appetite 3  Feeling bad or failure about yourself  2  Trouble concentrating 1  Moving slowly or fidgety/restless 1  Suicidal thoughts 1  PHQ-9 Score 14  Difficult doing work/chores Not difficult at all     Assessment: 31 y.o. [id number] anxiety and depression  Plan: Problem List Items Addressed This Visit   None Visit Diagnoses     Anxiety and depression    -  Primary   Relevant Medications   escitalopram (LEXAPRO) 20 MG tablet   Grief at loss of child           1) Anxiety/Depression - tolerated lexapro well thus far without side-effect and improvement in symptoms - increase dose of lexapro to 20mg  - discussed addition of Wellbutrin XL if needed  2) Thyroid and B12 screen has not been obtained previously  3) Return in about 13 days (around 04/27/2021) for Medication follow (phone).   04/29/2021, MD, Vena Austria [facility], [facility] 04/14/2021, 8:27 AM

## 2021-04-27 ENCOUNTER — Ambulatory Visit (INDEPENDENT_AMBULATORY_CARE_PROVIDER_SITE_OTHER): Payer: Managed Care, Other (non HMO) | Admitting: Obstetrics and Gynecology

## 2021-04-27 ENCOUNTER — Encounter: Payer: Self-pay | Admitting: Obstetrics and Gynecology

## 2021-04-27 DIAGNOSIS — F4321 Adjustment disorder with depressed mood: Secondary | ICD-10-CM

## 2021-04-27 DIAGNOSIS — F411 Generalized anxiety disorder: Secondary | ICD-10-CM

## 2021-04-27 DIAGNOSIS — Z634 Disappearance and death of family member: Secondary | ICD-10-CM

## 2021-04-27 MED ORDER — TRAZODONE HCL 50 MG PO TABS: 50.0000 mg | ORAL_TABLET | Freq: Every evening | ORAL | 4 refills | Status: DC | PRN

## 2021-04-27 MED ORDER — ESCITALOPRAM OXALATE 20 MG PO TABS: 20.0000 mg | ORAL_TABLET | Freq: Every day | ORAL | 11 refills | Status: AC

## 2021-04-27 NOTE — Progress Notes (Signed)
Doing well with lexapro 20mg  no issues, still some issues sleeping and night time anxiety add trazodone 50mg  qhs prn

## 2022-06-05 IMAGING — US US OB < 14 WEEKS - US OB TV
1 series · 13 of 28 positions shown · non-contrast
Comparison: None this pregnancy

CLINICAL DATA: Pregnant patient in first-trimester pregnancy with
vaginal bleeding and cramping. Beta HCG 17.

EXAM:
OBSTETRIC <14 WK US AND TRANSVAGINAL OB US
TECHNIQUE: Both transabdominal and transvaginal ultrasound examinations were
performed for complete evaluation of the gestation as well as the
maternal uterus, adnexal regions, and pelvic cul-de-sac.
Transvaginal technique was performed to assess early pregnancy.

[Series 1: us ob less than 14 weeks with ob transvaginal · 13 of 35 slices shown]
[im 2/35]
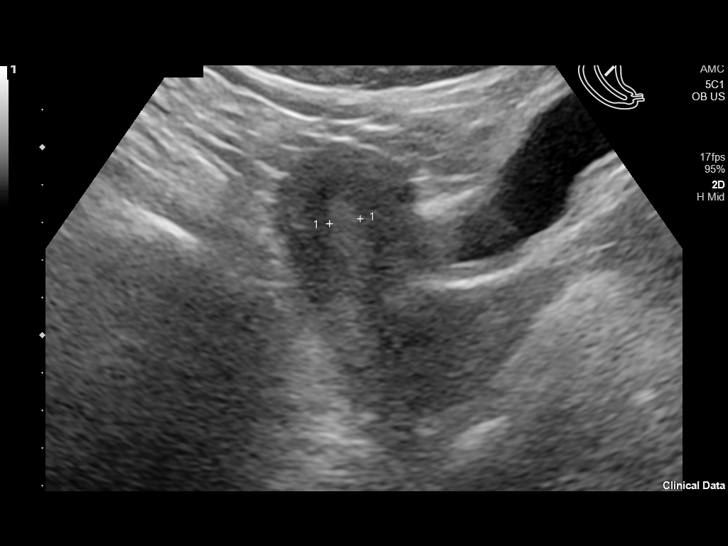
[im 4/35]
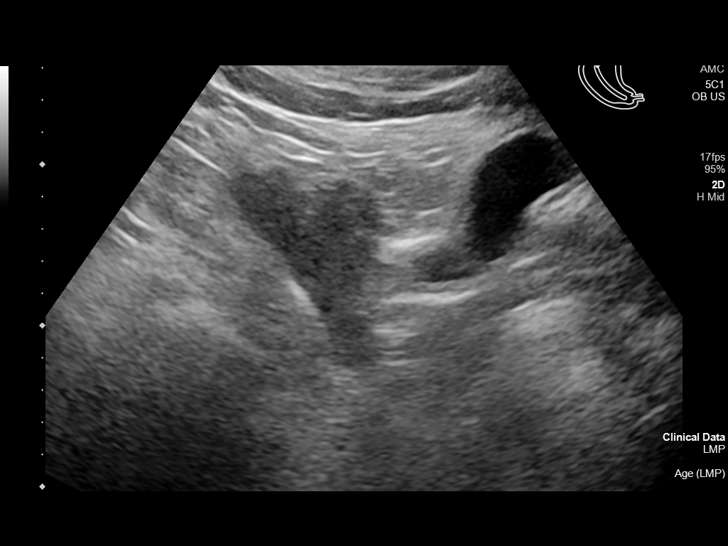
[im 7/35]
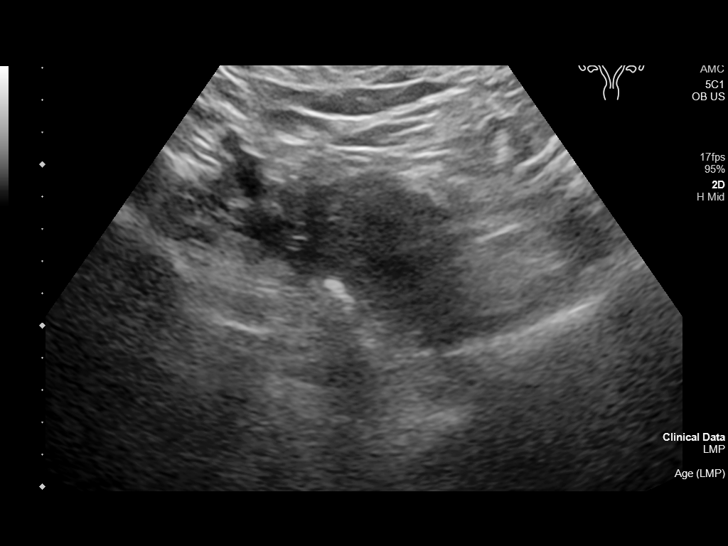
[im 9/35]
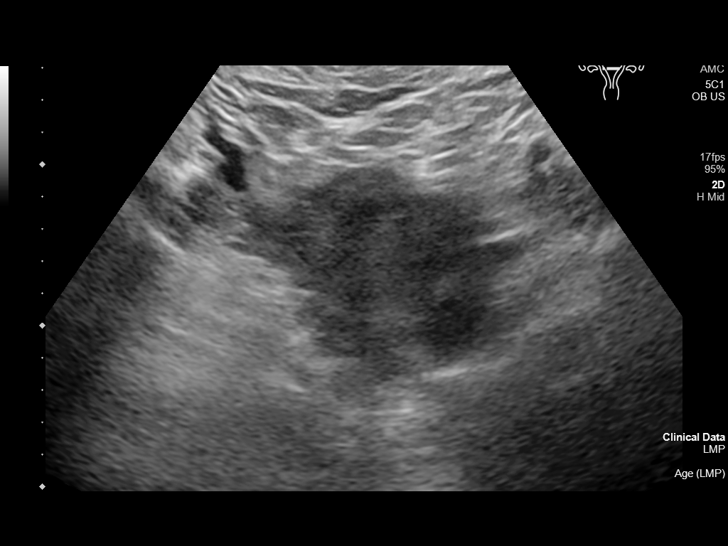
[im 12/35]
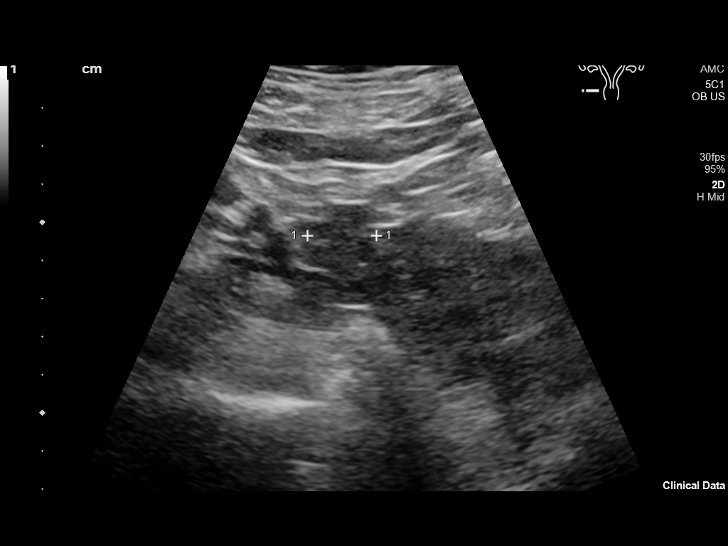
[im 14/35]
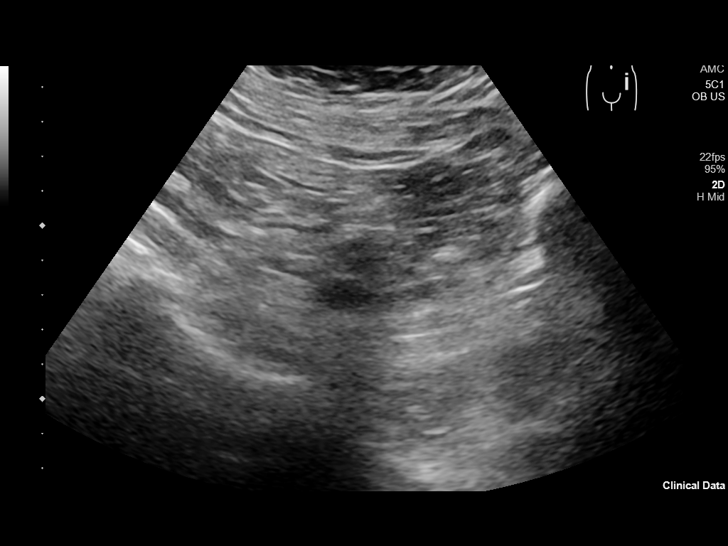
[im 18/35]
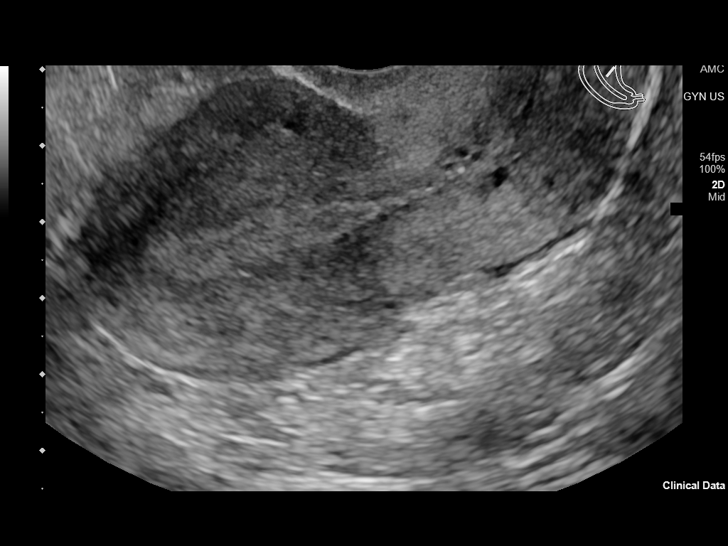
[im 21/35]
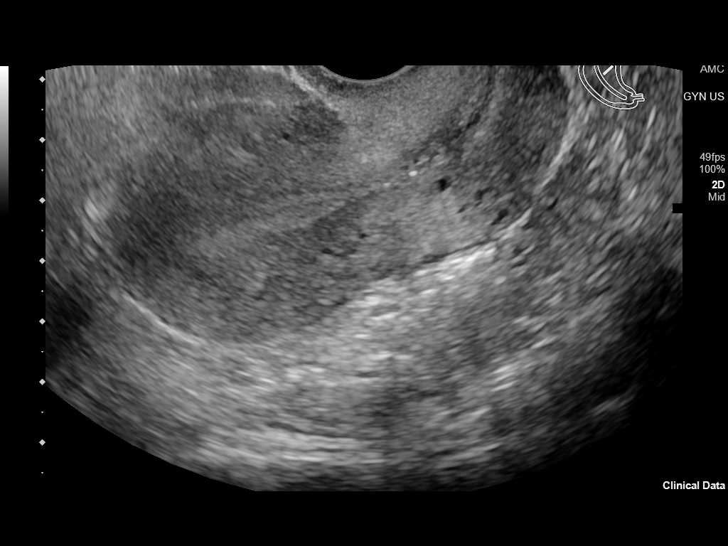
[im 23/35]
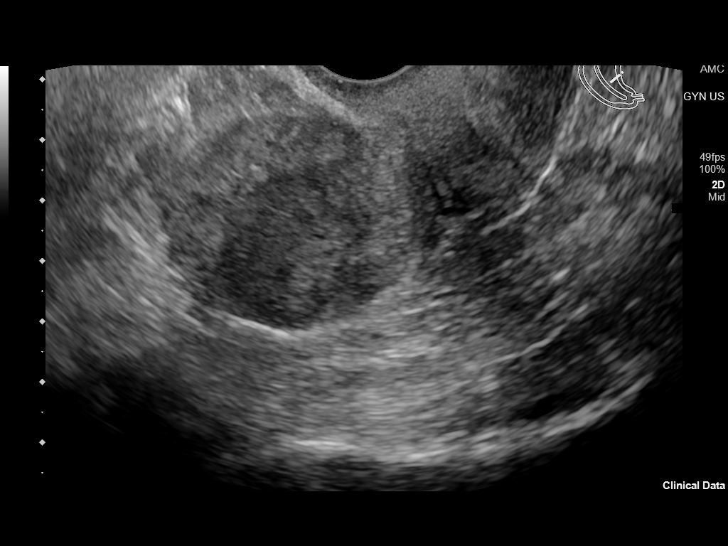
[im 26/35]
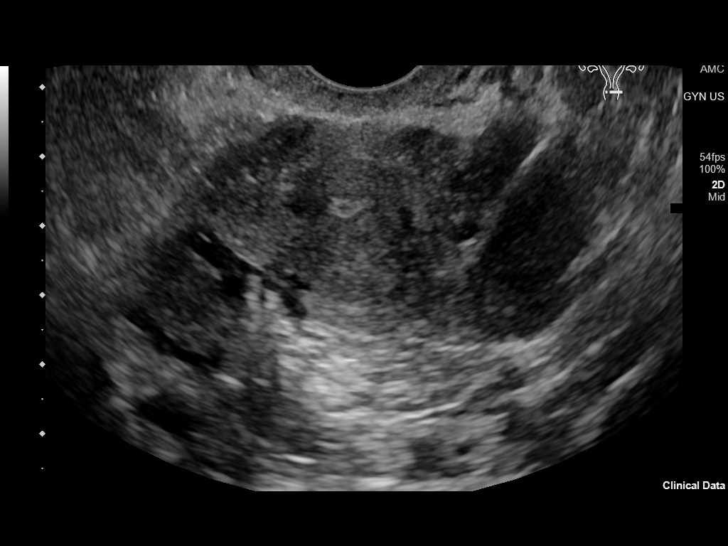
[im 28/35]
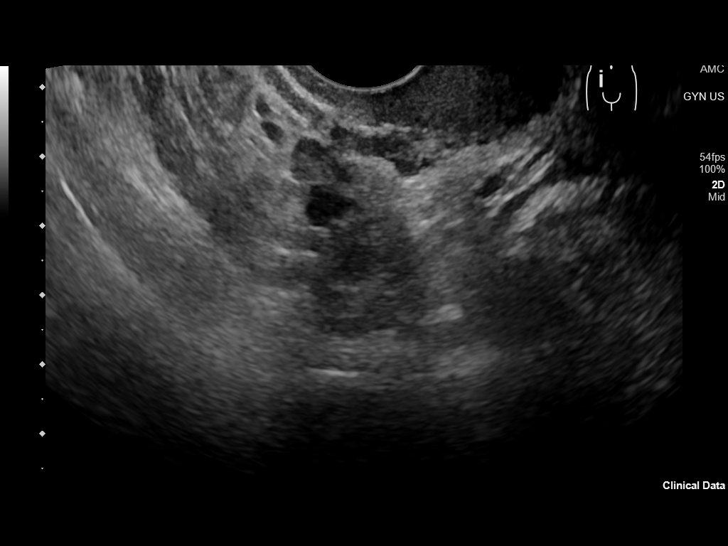
[im 31/35]
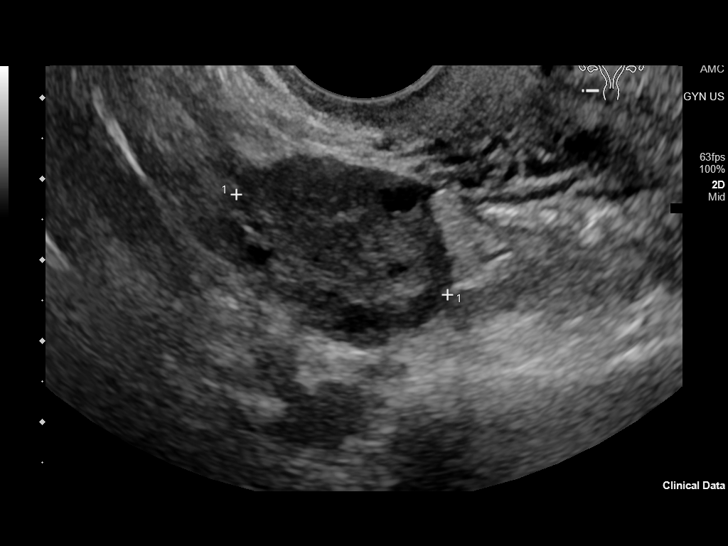
[im 33/35]
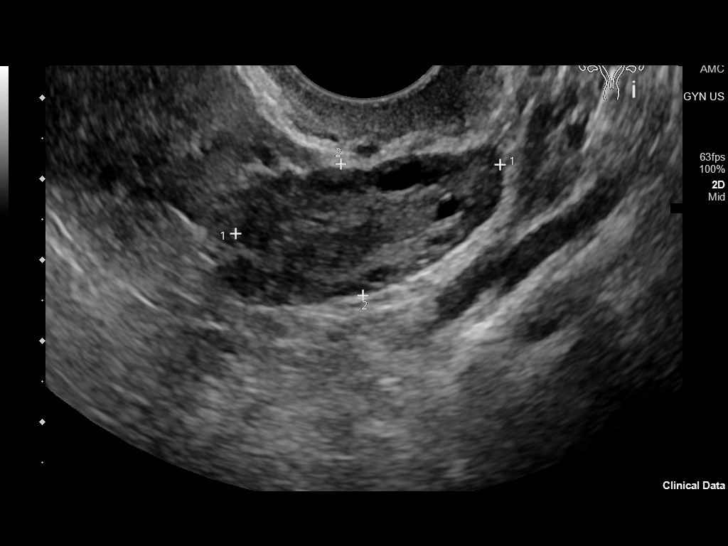

[13 of 28 positions shown; findings below may reference images not displayed]

FINDINGS: Intrauterine gestational sac: None

Yolk sac:  Not Visualized.

Embryo:  Not Visualized.

Cardiac Activity: Not Visualized.

Subchorionic hemorrhage:  Not applicable

Maternal uterus/adnexae: The uterus is anteverted. Endometrium is 6
mm. There is no fluid in the endometrial canal. Both ovaries are
visualized and are normal with normal blood flow. No pelvic free
fluid.
IMPRESSION: No intrauterine pregnancy or findings suspicious for ectopic
pregnancy. This is not unexpected given beta HCG of 17. Findings are
consistent with pregnancy of unknown location and may reflect early
intrauterine pregnancy not yet visualized sonographically, occult
ectopic pregnancy, or failed pregnancy. Recommend trending of beta
HCG and sonographic follow-up in 7-10 days as indicated.

## 2022-10-02 ENCOUNTER — Other Ambulatory Visit: Payer: Self-pay

## 2022-10-02 DIAGNOSIS — R1032 Left lower quadrant pain: Secondary | ICD-10-CM | POA: Diagnosis present

## 2022-10-02 DIAGNOSIS — R000 Tachycardia, unspecified: Secondary | ICD-10-CM | POA: Insufficient documentation

## 2022-10-02 DIAGNOSIS — I10 Essential (primary) hypertension: Secondary | ICD-10-CM | POA: Insufficient documentation

## 2022-10-02 DIAGNOSIS — N132 Hydronephrosis with renal and ureteral calculous obstruction: Secondary | ICD-10-CM | POA: Insufficient documentation

## 2022-10-02 DIAGNOSIS — N390 Urinary tract infection, site not specified: Secondary | ICD-10-CM | POA: Diagnosis not present

## 2022-10-02 LAB — CBC
HCT: 41.5 % (ref 36.0–46.0)
Hemoglobin: 13.6 g/dL (ref 12.0–15.0)
MCH: 28.6 pg (ref 26.0–34.0)
MCHC: 32.8 g/dL (ref 30.0–36.0)
MCV: 87.4 fL (ref 80.0–100.0)
Platelets: 331 10*3/uL (ref 150–400)
RBC: 4.75 MIL/uL (ref 3.87–5.11)
RDW: 12.7 % (ref 11.5–15.5)
WBC: 11.5 10*3/uL — ABNORMAL HIGH (ref 4.0–10.5)
nRBC: 0 % (ref 0.0–0.2)

## 2022-10-02 LAB — COMPREHENSIVE METABOLIC PANEL
ALT: 40 U/L (ref 0–44)
AST: 19 U/L (ref 15–41)
Albumin: 4.3 g/dL (ref 3.5–5.0)
Alkaline Phosphatase: 91 U/L (ref 38–126)
Anion gap: 9 (ref 5–15)
BUN: 17 mg/dL (ref 6–20)
CO2: 23 mmol/L (ref 22–32)
Calcium: 9.4 mg/dL (ref 8.9–10.3)
Chloride: 107 mmol/L (ref 98–111)
Creatinine, Ser: 1.15 mg/dL — ABNORMAL HIGH (ref 0.44–1.00)
GFR, Estimated: >60 mL/min (ref >60)
Glucose, Bld: 141 mg/dL — ABNORMAL HIGH (ref 70–99)
Potassium: 3.7 mmol/L (ref 3.5–5.1)
Sodium: 139 mmol/L (ref 135–145)
Total Bilirubin: 1 mg/dL (ref 0.3–1.2)
Total Protein: 7.7 g/dL (ref 6.5–8.1)

## 2022-10-02 LAB — URINALYSIS, ROUTINE W REFLEX MICROSCOPIC
Bilirubin Urine: NEGATIVE
Glucose, UA: NEGATIVE mg/dL
Ketones, ur: NEGATIVE mg/dL
Nitrite: POSITIVE — AB
Protein, ur: 30 mg/dL — AB
Specific Gravity, Urine: 1.01 (ref 1.005–1.030)
WBC, UA: >50 WBC/hpf (ref 0–5)
pH: 6 (ref 5.0–8.0)

## 2022-10-02 LAB — LIPASE, BLOOD: Lipase: 57 U/L — ABNORMAL HIGH (ref 11–51)

## 2022-10-02 NOTE — ED Triage Notes (Signed)
Pt to ed from home via POV for LLQ abdominal pain that started this morning. Pt advised she has taken motrin and tylenol with no relief. Pt denies any urinary symptoms or complaints. Pt is CAOx4, in no acute distress and ambulatory in triage.

## 2022-10-03 ENCOUNTER — Emergency Department
Admission: EM | Admit: 2022-10-03 | Discharge: 2022-10-03 | Disposition: A | Discharge status: Home / Self Care | Payer: Managed Care, Other (non HMO) | Attending: Emergency Medicine | Admitting: Emergency Medicine

## 2022-10-03 ENCOUNTER — Emergency Department: Payer: Managed Care, Other (non HMO)

## 2022-10-03 DIAGNOSIS — N1330 Unspecified hydronephrosis: Secondary | ICD-10-CM

## 2022-10-03 DIAGNOSIS — N390 Urinary tract infection, site not specified: Secondary | ICD-10-CM

## 2022-10-03 LAB — PREGNANCY, URINE: Preg Test, Ur: NEGATIVE

## 2022-10-03 MED ORDER — CEPHALEXIN 500 MG PO CAPS: 500.0000 mg | ORAL_CAPSULE | Freq: Four times a day (QID) | ORAL | 0 refills | Status: DC

## 2022-10-03 MED ORDER — IBUPROFEN 600 MG PO TABS
600.0000 mg | ORAL_TABLET | Freq: Once | ORAL | Status: AC
  Administered 2022-10-03: 600 mg via ORAL
  Filled 2022-10-03: qty 1

## 2022-10-03 MED ORDER — ACETAMINOPHEN 500 MG PO TABS
1000.0000 mg | ORAL_TABLET | Freq: Once | ORAL | Status: AC
  Administered 2022-10-03: 1000 mg via ORAL
  Filled 2022-10-03: qty 2

## 2022-10-03 MED ORDER — CEPHALEXIN 500 MG PO CAPS
500.0000 mg | ORAL_CAPSULE | Freq: Once | ORAL | Status: AC
  Administered 2022-10-03: 500 mg via ORAL
  Filled 2022-10-03: qty 1

## 2022-10-03 MED ORDER — CEPHALEXIN 500 MG PO CAPS: 500.0000 mg | ORAL_CAPSULE | Freq: Four times a day (QID) | ORAL | 0 refills | Status: AC

## 2022-10-03 NOTE — ED Provider Notes (Signed)
[facility] Provider Note    Event Date/Time   First MD Initiated Contact with Patient 10/03/22 0011     (approximate)   History   Abdominal Pain   HPI  Sherry Townsend is a 32 y.o. female   Past medical history of kidney stones who presents emergency department with left lower abdominal pain severe intermittent radiating down to the groin starting this morning.  Urinary frequency as well.  No dysuria.  No vaginal discharge or abnormal bleeding.  No fever or chills.    Independent Historian contributed to assessment above: Husband is at bedside corroborates information given above  External Medical Documents Reviewed: Urology note from 2021 Dr. Lonna Cobb for kidney stones      Physical Exam   Triage Vital Signs: ED Triage Vitals [10/02/22 2240]  Enc Vitals Group     BP (!) 150/90     Pulse Rate (!) 112     Resp 18     Temp 98 F (36.7 C)     Temp Source Oral     SpO2 99 %     Weight 180 lb (81.6 kg)     Height 5\' 5"  (1.651 m)     Head Circumference      Peak Flow      Pain Score 6     Pain Loc      Pain Edu?      Excl. in GC?     Most recent vital signs: Vitals:   10/03/22 0045 10/03/22 0100  BP:  126/76  Pulse: (!) 102 (!) 105  Resp:  16  Temp:  98.9 F (37.2 C)  SpO2: 100% 100%    General: Awake, no distress.  CV:  Good peripheral perfusion.  Resp:  Normal effort.  Abd:  No distention.  Other:  Awake alert comfortable soft nontender abdomen initial vital signs showed tachycardia 112 and hypertension afebrile.  No CVA tenderness.   ED Results / Procedures / Treatments   Labs (all labs ordered are listed, but only abnormal results are displayed) Labs Reviewed  LIPASE, BLOOD - Abnormal; Notable for the following components:      Result Value   Lipase 57 (*)    All other components within normal limits  COMPREHENSIVE METABOLIC PANEL - Abnormal; Notable for the following components:   Glucose, Bld 141 (*)    Creatinine,  Ser 1.15 (*)    All other components within normal limits  CBC - Abnormal; Notable for the following components:   WBC 11.5 (*)    All other components within normal limits  URINALYSIS, ROUTINE W REFLEX MICROSCOPIC - Abnormal; Notable for the following components:   Color, Urine YELLOW (*)    APPearance CLOUDY (*)    Hgb urine dipstick MODERATE (*)    Protein, ur 30 (*)    Nitrite POSITIVE (*)    Leukocytes,Ua LARGE (*)    Bacteria, UA MANY (*)    All other components within normal limits  PREGNANCY, URINE  POC URINE PREG, ED     I ordered and reviewed the above labs they are notable for nitrite positive bacteria in the urine and white blood cell count mildly elevated 11.5    RADIOLOGY I independently reviewed and interpreted CT of the abdomen pelvis and see no obvious ureteral stone   PROCEDURES:  Critical Care performed: No  Procedures   MEDICATIONS ORDERED IN ED: Medications  cephALEXin (KEFLEX) capsule 500 mg (500 mg Oral Given 10/03/22 0101)  acetaminophen (TYLENOL) tablet 1,000 mg (1,000 mg Oral Given 10/03/22 0101)  ibuprofen (ADVIL) tablet 600 mg (600 mg Oral Given 10/03/22 0101)     IMPRESSION / MDM / ASSESSMENT AND PLAN / ED COURSE  I reviewed the triage vital signs and the nursing notes.                                Patient's presentation is most consistent with acute presentation with potential threat to life or bodily function.  Differential diagnosis includes, but is not limited to, ureterolithiasis, pyelonephritis, urinary tract infection, diverticulitis, pregnancy related like ectopic pregnancy, obstructive uropathy   The patient is on the cardiac monitor to evaluate for evidence of arrhythmia and/or significant heart rate changes.  MDM:     Urinary tract infection history of kidney stones concern for infected stone.  Pain completely resolved at this time.  CT scan shows bilateral nonobstructing renal calculi and mild left-sided hydronephrosis  without obstructing renal calculus.  Perhaps she passed the stone given her complete resolution of symptoms now.  Antibiotics for urinary tract infection will follow-up with urology.  Return precautions given.        FINAL CLINICAL IMPRESSION(S) / ED DIAGNOSES   Final diagnoses:  Urinary tract infection without hematuria, site unspecified  Hydronephrosis, unspecified hydronephrosis type     Rx / DC Orders   ED Discharge Orders          Ordered    cephALEXin (KEFLEX) 500 MG capsule  4 times daily        10/03/22 0147             Note:  This document was prepared using Dragon voice recognition software and may include unintentional dictation errors.    Pilar Jarvis, MD 10/03/22 [phone]

## 2022-10-03 NOTE — Discharge Instructions (Signed)
Take antibiotics for the full course as prescribed.  Take acetaminophen 650 mg and ibuprofen 400 mg every 6 hours for pain.  Take with food.   Call Dr. Lonna Cobb for follow-up appointment.  There was no evidence of obstructing urinary stone, but there was some hydronephrosis or swelling of your left kidney which may indicate that you had a recent stone that had passed.  If you have any new, worsening, or unexpected symptoms including but not limited to severe pain, fever, or any other concerning symptoms call your doctor right away or come back to the emergency department.

## 2022-10-07 ENCOUNTER — Ambulatory Visit: Payer: Managed Care, Other (non HMO) | Admitting: Nurse Practitioner

## 2022-10-27 ENCOUNTER — Ambulatory Visit: Payer: Managed Care, Other (non HMO) | Admitting: Nurse Practitioner

## 2023-01-27 ENCOUNTER — Ambulatory Visit: Payer: Managed Care, Other (non HMO) | Admitting: Family Medicine

## 2023-02-21 ENCOUNTER — Ambulatory Visit: Payer: Managed Care, Other (non HMO) | Admitting: Nurse Practitioner

## 2023-05-13 ENCOUNTER — Other Ambulatory Visit: Payer: Self-pay

## 2023-05-13 DIAGNOSIS — B9689 Other specified bacterial agents as the cause of diseases classified elsewhere: Secondary | ICD-10-CM | POA: Diagnosis not present

## 2023-05-13 DIAGNOSIS — O2342 Unspecified infection of urinary tract in pregnancy, second trimester: Secondary | ICD-10-CM | POA: Diagnosis not present

## 2023-05-13 DIAGNOSIS — M545 Low back pain, unspecified: Secondary | ICD-10-CM | POA: Insufficient documentation

## 2023-05-13 DIAGNOSIS — Z5321 Procedure and treatment not carried out due to patient leaving prior to being seen by health care provider: Secondary | ICD-10-CM | POA: Insufficient documentation

## 2023-05-13 DIAGNOSIS — Z3A18 18 weeks gestation of pregnancy: Secondary | ICD-10-CM | POA: Diagnosis not present

## 2023-05-13 DIAGNOSIS — O26892 Other specified pregnancy related conditions, second trimester: Secondary | ICD-10-CM | POA: Diagnosis present

## 2023-05-13 NOTE — ED Provider Triage Note (Signed)
Emergency Medicine Provider Triage Evaluation Note  Sherry Townsend , a 33 y.o. female  was evaluated in triage.  Pt complains of back pain x 1 week with radiation to groin. [redacted] weeks pregnant. Denies urinary symptoms. Endorse history of kidney stone.   Review of Systems  Positive:  Negative:   Physical Exam  BP 130/76   Pulse (!) 129   Temp 99.1 F (37.3 C)   Resp 20   LMP 12/31/2022   SpO2 96%  Gen:   Awake, no distress   Resp:  Normal effort  MSK:   Moves extremities without difficulty  Other:  (-) CVA tenderness bilaterally  Medical Decision Making  Medically screening exam initiated at 7:20 PM.  Appropriate orders placed.  Sherry Townsend was informed that the remainder of the evaluation will be completed by another provider, this initial triage assessment does not replace that evaluation, and the importance of remaining in the ED until their evaluation is complete.     Sherry Townsend, Sherry Townsend A, PA-C 05/13/23 2012

## 2023-05-13 NOTE — ED Triage Notes (Signed)
Pt states that she started having intermittent  left hip pain that radiated into left groin x 1 week ago. Pain is now also in right hip/lower back, as well. She is 105w5d pregnant and sees Retail banker. [phone]

## 2023-05-14 ENCOUNTER — Emergency Department: Payer: Managed Care, Other (non HMO)

## 2023-05-14 ENCOUNTER — Other Ambulatory Visit: Payer: Self-pay

## 2023-05-14 ENCOUNTER — Emergency Department
Admission: EM | Admit: 2023-05-14 | Discharge: 2023-05-14 | Disposition: A | Discharge status: Home / Self Care | Payer: Managed Care, Other (non HMO) | Source: Home / Self Care | Attending: Emergency Medicine | Admitting: Emergency Medicine

## 2023-05-14 ENCOUNTER — Emergency Department
Admission: EM | Admit: 2023-05-14 | Discharge: 2023-05-14 | Discharge status: Against Medical Advice | Payer: Managed Care, Other (non HMO) | Attending: Emergency Medicine | Admitting: Emergency Medicine

## 2023-05-14 ENCOUNTER — Encounter: Payer: Self-pay | Admitting: Emergency Medicine

## 2023-05-14 DIAGNOSIS — B9689 Other specified bacterial agents as the cause of diseases classified elsewhere: Secondary | ICD-10-CM | POA: Insufficient documentation

## 2023-05-14 DIAGNOSIS — Z3A18 18 weeks gestation of pregnancy: Secondary | ICD-10-CM | POA: Insufficient documentation

## 2023-05-14 DIAGNOSIS — O2342 Unspecified infection of urinary tract in pregnancy, second trimester: Secondary | ICD-10-CM | POA: Insufficient documentation

## 2023-05-14 DIAGNOSIS — R1084 Generalized abdominal pain: Secondary | ICD-10-CM

## 2023-05-14 DIAGNOSIS — O2341 Unspecified infection of urinary tract in pregnancy, first trimester: Secondary | ICD-10-CM

## 2023-05-14 LAB — BASIC METABOLIC PANEL
Anion gap: 11 (ref 5–15)
BUN: 8 mg/dL (ref 6–20)
CO2: 22 mmol/L (ref 22–32)
Calcium: 9.2 mg/dL (ref 8.9–10.3)
Chloride: 103 mmol/L (ref 98–111)
Creatinine, Ser: 0.77 mg/dL (ref 0.44–1.00)
GFR, Estimated: >60 mL/min (ref >60)
Glucose, Bld: 134 mg/dL — ABNORMAL HIGH (ref 70–99)
Potassium: 3.7 mmol/L (ref 3.5–5.1)
Sodium: 136 mmol/L (ref 135–145)

## 2023-05-14 LAB — URINALYSIS, COMPLETE (UACMP) WITH MICROSCOPIC
Bilirubin Urine: NEGATIVE
Glucose, UA: NEGATIVE mg/dL
Ketones, ur: NEGATIVE mg/dL
Nitrite: POSITIVE — AB
Protein, ur: 100 mg/dL — AB
Specific Gravity, Urine: 1.019 (ref 1.005–1.030)
pH: 6 (ref 5.0–8.0)

## 2023-05-14 LAB — CBC
HCT: 35.5 % — ABNORMAL LOW (ref 36.0–46.0)
Hemoglobin: 11.7 g/dL — ABNORMAL LOW (ref 12.0–15.0)
MCH: 29.3 pg (ref 26.0–34.0)
MCHC: 33 g/dL (ref 30.0–36.0)
MCV: 89 fL (ref 80.0–100.0)
Platelets: 246 10*3/uL (ref 150–400)
RBC: 3.99 MIL/uL (ref 3.87–5.11)
RDW: 13.1 % (ref 11.5–15.5)
WBC: 8.8 10*3/uL (ref 4.0–10.5)
nRBC: 0 % (ref 0.0–0.2)

## 2023-05-14 LAB — HCG, QUANTITATIVE, PREGNANCY: hCG, Beta Chain, Quant, S: 59203 m[IU]/mL — ABNORMAL HIGH (ref <5)

## 2023-05-14 MED ORDER — AMOXICILLIN 500 MG PO CAPS
500.0000 mg | ORAL_CAPSULE | Freq: Once | ORAL | Status: AC
  Administered 2023-05-14: 500 mg via ORAL
  Filled 2023-05-14: qty 1

## 2023-05-14 MED ORDER — AMOXICILLIN 500 MG PO CAPS: 500.0000 mg | ORAL_CAPSULE | Freq: Three times a day (TID) | ORAL | 0 refills | Status: DC

## 2023-05-14 MED ORDER — ONDANSETRON 4 MG PO TBDP
4.0000 mg | ORAL_TABLET | Freq: Once | ORAL | Status: AC
  Administered 2023-05-14: 4 mg via ORAL
  Filled 2023-05-14: qty 1

## 2023-05-14 MED ORDER — ACETAMINOPHEN 500 MG PO TABS
1000.0000 mg | ORAL_TABLET | Freq: Once | ORAL | Status: DC
  Filled 2023-05-14: qty 2

## 2023-05-14 MED ORDER — ONDANSETRON HCL 4 MG PO TABS: 4.0000 mg | ORAL_TABLET | Freq: Three times a day (TID) | ORAL | 0 refills | Status: AC | PRN

## 2023-05-14 NOTE — ED Triage Notes (Signed)
See triage from 2/1. Pt c/o same pain, however also states that pain is worsening.

## 2023-05-14 NOTE — Discharge Instructions (Addendum)
Your exam, labs, and [place] are overall reassuring. You are being treated for a UTI with antibiotics. Take the prescription antibiotic as directed and the nausea medicine as needed. Follow-up with OB as scheduled.

## 2023-05-14 NOTE — ED Notes (Signed)
Pt's husband to desk stating patient is in pain. This RN explained spoke with EDP and only able to offer tylenol per Dr. Rosalia Hammers. Awaiting to hear if patient is agreeable to Tylenol at this time.

## 2023-05-14 NOTE — ED Notes (Signed)
Pt advised registration her right hand is tingling. Re assessed pts vitals and VS are within normal limits. Pt states she is in pain would like some pain medicine, First nurse RN notified.

## 2023-05-16 LAB — URINE CULTURE
Culture: >=100000 — AB
Special Requests: NORMAL

## 2023-05-19 ENCOUNTER — Telehealth: Payer: Self-pay | Admitting: Physician Assistant

## 2023-05-19 NOTE — Telephone Encounter (Cosign Needed)
 Called the patient to inquire on her current status as urine culture results were available.  The amoxicillin  that was provided on the date of service, is appropriate for her E. coli bacteriuria.    I initially called the patient to advise that a new prescription would be sent to the pharmacy that was appropriate.    I Called back shortly thereafter to verify and confirm that she did not need a new prescription as the amoxicillin  provided was an appropriate antibiotic for her E. coli bacteria.

## 2023-06-06 IMAGING — US US OB COMP +14 WK
1 series · 13 of 28 positions shown · non-contrast
Comparison: none

CLINICAL DATA: Pregnancy.  Assess fetal anatomy.

EXAM:
OBSTETRICAL ULTRASOUND >14 WKS

[Series 1: us ob comp +14 wk · 0.25mm/px · 13 of 76 slices shown]
[im 3/76]
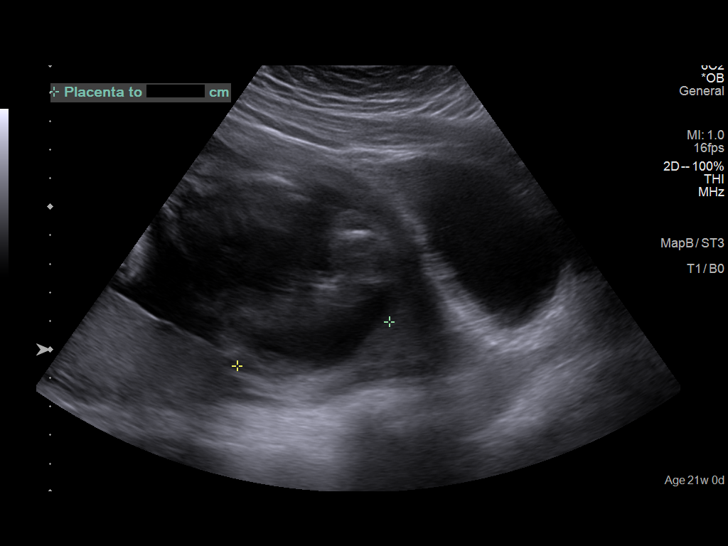
[im 9/76]
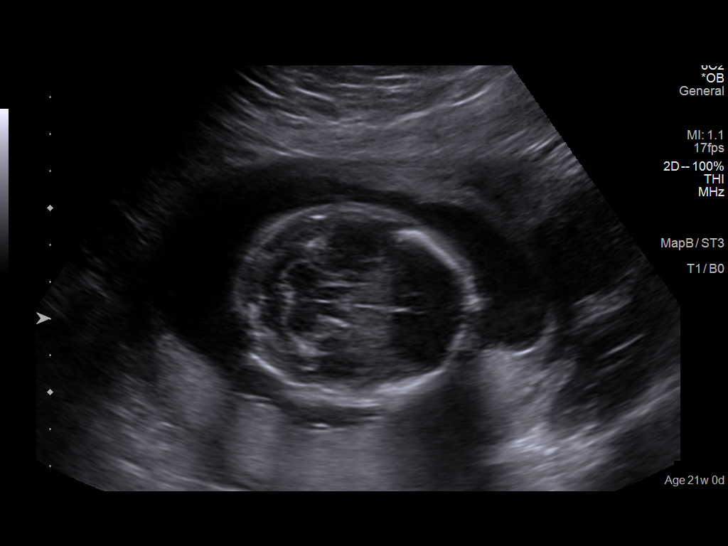
[im 14/76]
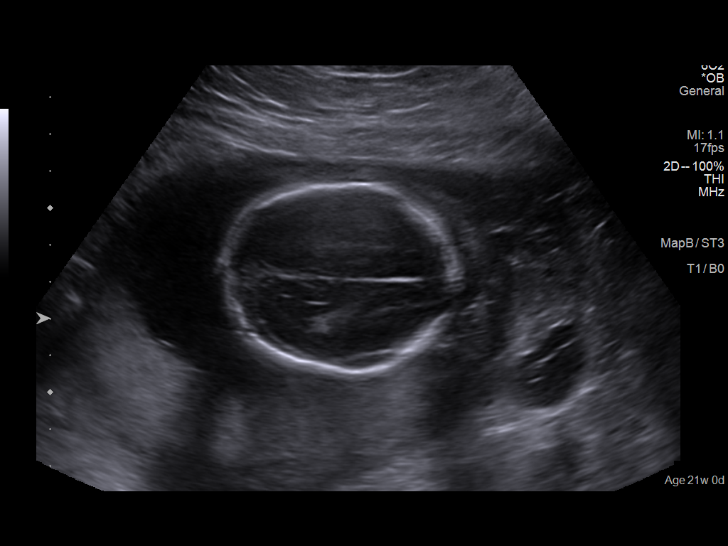
[im 20/76]
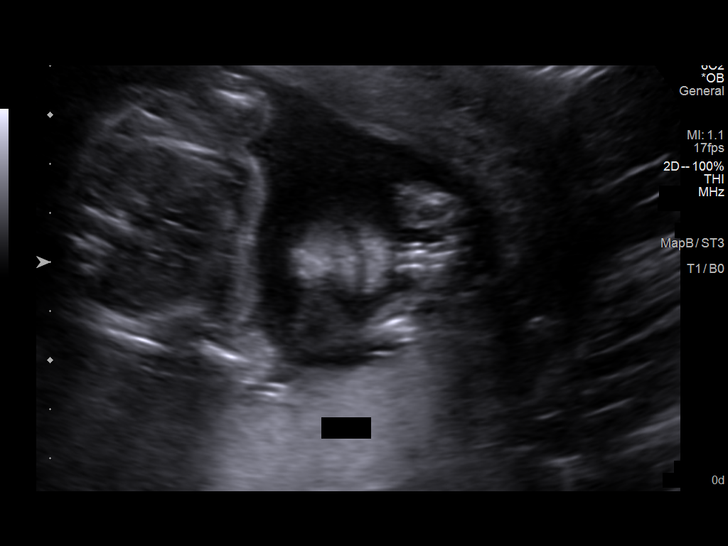
[im 26/76]
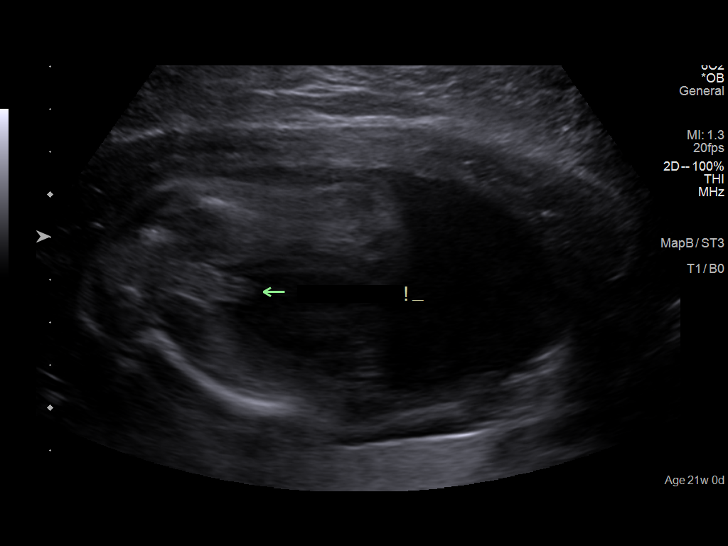
[im 31/76]
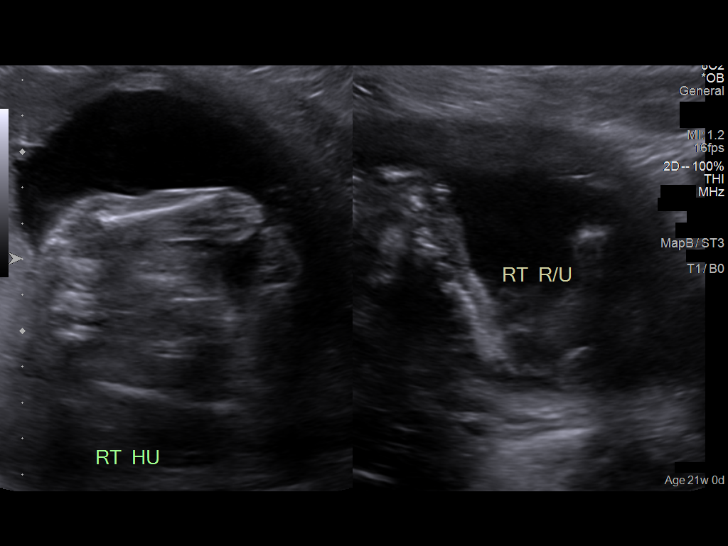
[im 39/76]
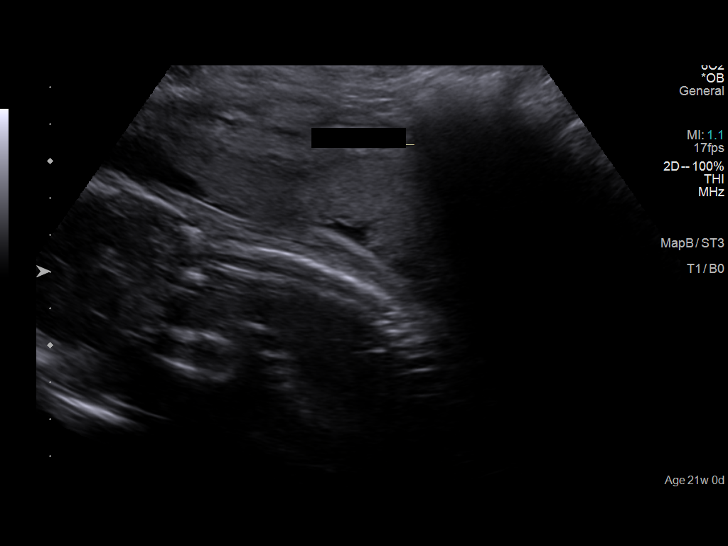
[im 45/76]
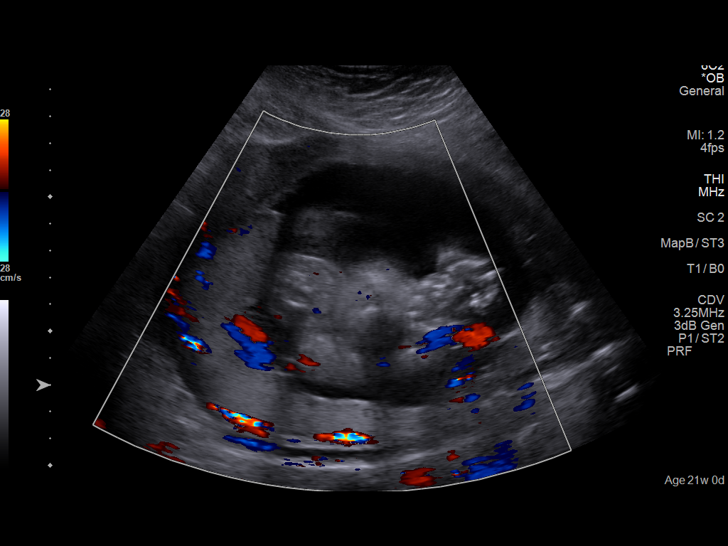
[im 51/76]
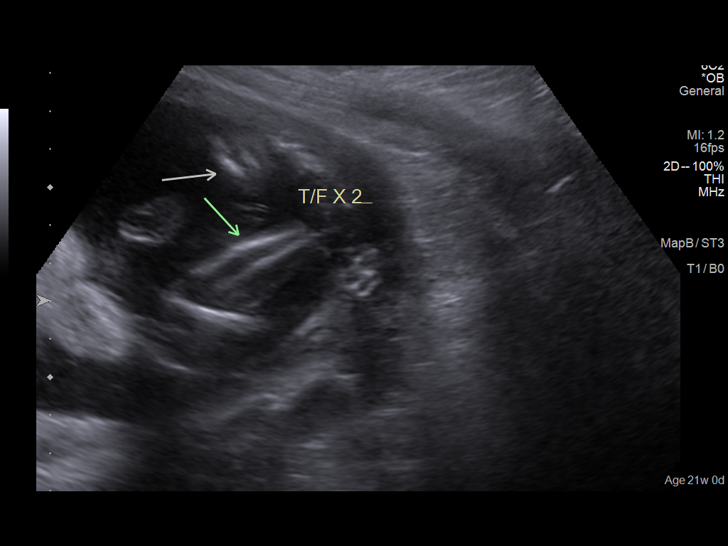
[im 56/76]
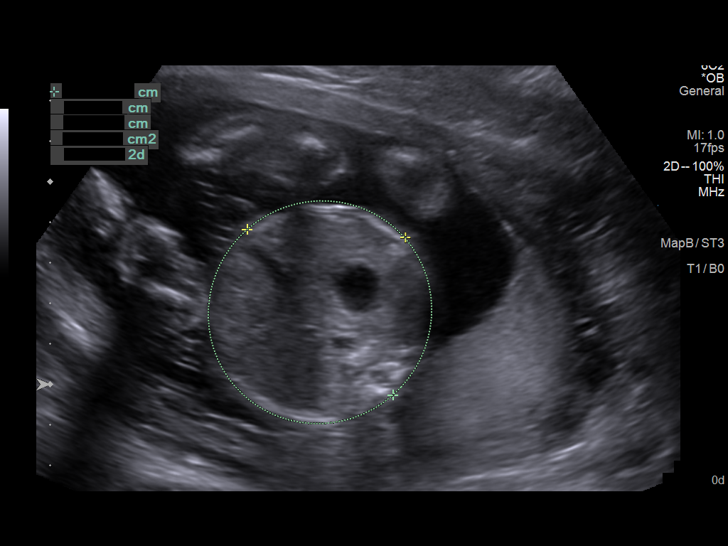
[im 62/76]
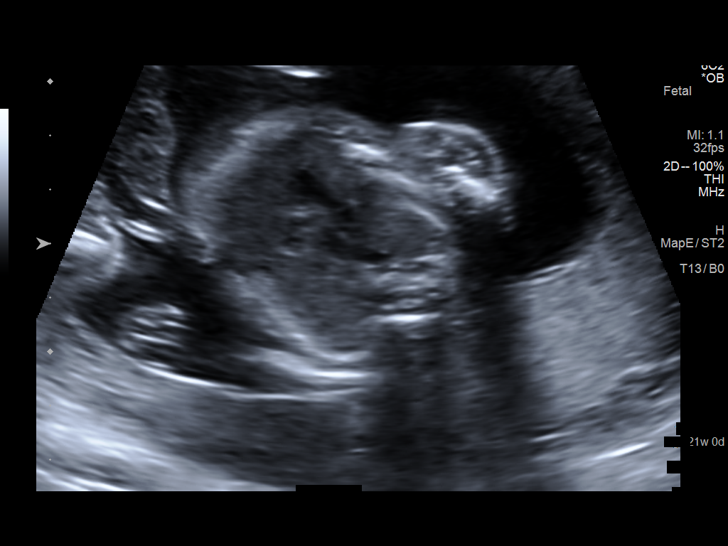
[im 67/76]
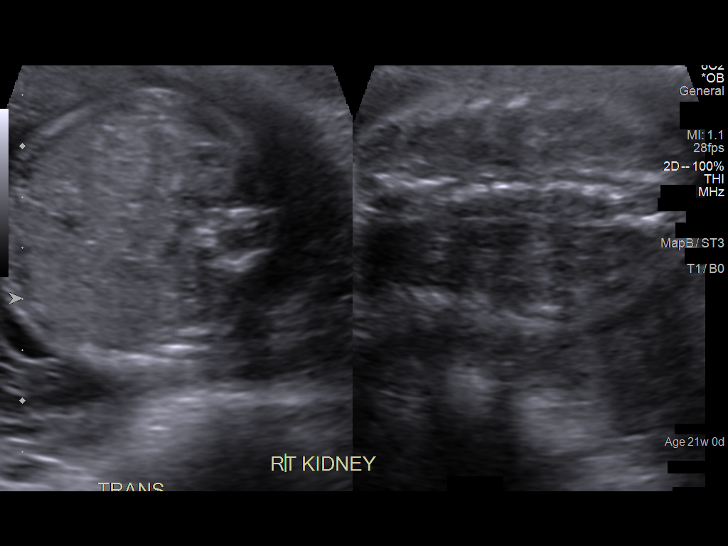
[im 73/76]
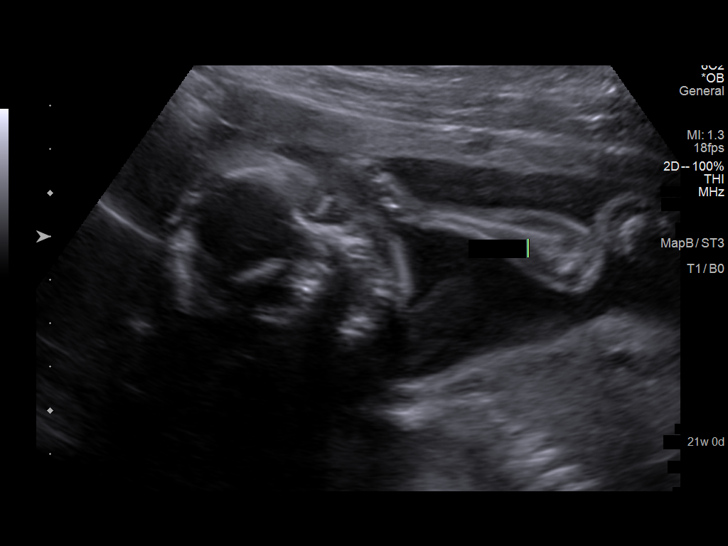

[13 of 28 positions shown; findings below may reference images not displayed]

FINDINGS: Number of Fetuses: 1

Heart Rate:  144 bpm

Movement: Yes

Presentation: Breech

Previa: No

Placental Location: Posterior-fundal

Amniotic Fluid (Subjective): Normal

Amniotic Fluid (Objective):

Vertical pocket = 6.1cm

FETAL BIOMETRY

BPD: 5.0cm 21w 2d

HC:   19.1cm 21w 2d

AC:   17.4cm 22w 2d

FL:   3.7cm 21w 6d

Current Mean GA: 21w 5d US EDC: 01/29/2021

Assigned GA:  21w 0d Assigned EDC: 02/03/2021

Estimated Fetal Weight:  467g 91%ile

FETAL ANATOMY

Lateral Ventricles: Appears normal

Thalami/CSP: Appears normal

Posterior Fossa:  Appears normal

Nuchal Region: Appears normal   NFT= 4 mm

Upper Lip: Appears normal

Spine: Appears normal

4 Chamber Heart on Left: Appears normal

LVOT: Appears normal

RVOT: Appears normal

Stomach on Left: Appears normal

3 Vessel Cord: Appears normal

Cord Insertion site: Appears normal

Kidneys: Appears normal

Bladder: Appears normal

Extremities: Appears normal

Sex: Male

Technically difficult due to: Fetal positioning

Maternal Findings:

Cervix:  Closed.  4.1 cm.
IMPRESSION: 1. Single live intrauterine gestation in breech presentation.
2. Assigned gestational age of 21 weeks 0 days. Adequate interval
growth.
3. Complete fetal anatomic survey, as above. No fetal anomaly
detected.

## 2023-06-13 ENCOUNTER — Encounter: Payer: Managed Care, Other (non HMO) | Admitting: Urology

## 2023-06-16 ENCOUNTER — Encounter: Payer: Self-pay | Admitting: Urology

## 2023-06-16 ENCOUNTER — Ambulatory Visit: Payer: Self-pay | Admitting: Urology

## 2023-06-16 VITALS — BP 111/72 | HR 116 | Ht 64.0 in | Wt 192.0 lb

## 2023-06-16 DIAGNOSIS — R8271 Bacteriuria: Secondary | ICD-10-CM | POA: Diagnosis not present

## 2023-06-16 DIAGNOSIS — N200 Calculus of kidney: Secondary | ICD-10-CM

## 2023-06-16 LAB — URINALYSIS, COMPLETE
Bilirubin, UA: NEGATIVE
Glucose, UA: NEGATIVE
Ketones, UA: NEGATIVE
Nitrite, UA: POSITIVE — AB
Specific Gravity, UA: 1.015 (ref 1.005–1.030)
Urobilinogen, Ur: 1 mg/dL (ref 0.2–1.0)
pH, UA: 7 (ref 5.0–7.5)

## 2023-06-16 LAB — MICROSCOPIC EXAMINATION
RBC, Urine: >30 /HPF — AB (ref 0–2)
WBC, UA: >30 /HPF — AB (ref 0–5)

## 2023-06-16 MED ORDER — CEFUROXIME AXETIL 250 MG PO TABS: 250.0000 mg | ORAL_TABLET | Freq: Two times a day (BID) | ORAL | 0 refills | Status: AC

## 2023-06-16 NOTE — Progress Notes (Signed)
 I, Maysun Anabel Bene, acting as a scribe for Riki Altes, MD., have documented all relevant documentation on the behalf of Riki Altes, MD, as directed by Riki Altes, MD while in the presence of Riki Altes, MD.  06/16/2023 11:27 AM   Sherry Townsend [birth year] [id number]   Chief Complaint  Patient presents with   Nephrolithiasis    HPI: Sherry Townsend is a 33 y.o. female self-referred for nephrolithiasis.   [redacted] weeks gestational age pregnancy and presented to the ED 05/14/23 complaining of left flank pain radiating to the left groin region. Her urinalysis was nitrite positive, with moderate leukocyte. She was treated for a UTI. Urine culture grew E. coli. No GU imaging was performed. She does have a history of stone disease and had a CT renal stone study June 2024 which showed non-obstructing renal calculi including 1 and 2 mm non-obstructing calculi in the right kidney and 2 and 5 mn non-obstructing calculi in the left kidney. She had mild left-sided hydropnephrosis possibly reflecting a recently passed stone. She presently is asymptomatic. She has noted her urine is slightly darker and has a stronger odor. She has no dysuria.    PMH: Past Medical History:  Diagnosis Date   Anemia    Kidney stone    Kidney stone complicating pregnancy 05/14/2015    Surgical History: Past Surgical History:  Procedure Laterality Date   CYSTOSCOPY W/ RETROGRADES Left 12/28/2015   Procedure: CYSTOSCOPY WITH RETROGRADE PYELOGRAM;  Surgeon: Vanna Scotland, MD;  Location: [facility];  Service: Urology;  Laterality: Left;   CYSTOSCOPY WITH STENT PLACEMENT Left 12/28/2015   Procedure: CYSTOSCOPY WITH STENT PLACEMENT;  Surgeon: Vanna Scotland, MD;  Location: [facility];  Service: Urology;  Laterality: Left;   URETEROSCOPY WITH HOLMIUM LASER LITHOTRIPSY Left 12/28/2015   Procedure: URETEROSCOPY WITH HOLMIUM LASER LITHOTRIPSY;  Surgeon: Vanna Scotland, MD;  Location: [facility];  Service: Urology;   Laterality: Left;   WISDOM TOOTH EXTRACTION      Home Medications:  Allergies as of 06/16/2023       Reactions   Toradol [ketorolac Tromethamine] Other (See Comments)   Numbness of legs        Medication List        Accurate as of June 16, 2023 11:27 AM. If you have any questions, ask your nurse or doctor.          STOP taking these medications    amoxicillin 500 MG capsule Commonly known as: AMOXIL Stopped by: Riki Altes   multivitamin-prenatal 27-0.8 MG Tabs tablet Stopped by: Riki Altes       TAKE these medications    cefUROXime 250 MG tablet Commonly known as: CEFTIN Take 1 tablet (250 mg total) by mouth 2 (two) times daily with a meal for 7 days. Started by: Riki Altes   escitalopram 20 MG tablet Commonly known as: Lexapro Take 1 tablet (20 mg total) by mouth daily.   hydrOXYzine 25 MG tablet Commonly known as: ATARAX Take 1 tablet (25 mg total) by mouth every 6 (six) hours as needed for anxiety.   Norethindrone-Ethinyl Estradiol-Fe Biphas 1 MG-10 MCG / 10 MCG tablet Commonly known as: LO LOESTRIN FE Take 1 tablet by mouth daily.   ondansetron 4 MG tablet Commonly known as: ZOFRAN Take 1 tablet (4 mg total) by mouth every 8 (eight) hours as needed for nausea or vomiting.   traZODone 50 MG tablet Commonly known as: DESYREL Take 1 tablet (50 mg  total) by mouth at bedtime as needed for sleep.        Allergies:  Allergies  Allergen Reactions   Toradol [Ketorolac Tromethamine] Other (See Comments)    Numbness of legs    Family History: Family History  Problem Relation Age of Onset   Nephrolithiasis Maternal Grandfather    Nephrolithiasis Maternal Aunt    Heart disease Maternal Grandmother    Bladder Cancer Paternal Grandmother     Social History:  reports that she has never smoked. She has never used smokeless tobacco. She reports current alcohol use. She reports that she does not use drugs.   Physical Exam: BP 111/72    Pulse (!) 116   Ht 5\' 4"  (1.626 m)   Wt 192 lb (87.1 kg)   LMP 12/31/2022   BMI 32.96 kg/m   Constitutional:  Alert and oriented, No acute distress. HEENT: [place] AT Respiratory: Normal respiratory effort, no increased work of breathing. Psychiatric: Normal mood and affect.   Urinalysis Dipstick 3+ blood/nitrite positive/3+ leukocytes, microscopic >30 WBC, 0-10 epis.   Pertinent Imaging: CT was personally reviewed and interpreted.   CT Renal Stone Study  Narrative CLINICAL DATA:  Left lower quadrant pain.  EXAM: CT ABDOMEN AND PELVIS WITHOUT CONTRAST  TECHNIQUE: Multidetector CT imaging of the abdomen and pelvis was performed following the standard protocol without IV contrast.  RADIATION DOSE REDUCTION: This exam was performed according to the departmental dose-optimization program which includes automated exposure control, adjustment of the mA and/or kV according to patient size and/or use of iterative reconstruction technique.  COMPARISON:  September 12, 2014  FINDINGS: Lower chest: No acute abnormality.  Hepatobiliary: No focal liver abnormality is seen. Tiny gallstones are seen within the lumen of a contracted gallbladder. There is no evidence of gallbladder wall thickening or biliary dilatation.  Pancreas: Unremarkable. No pancreatic ductal dilatation or surrounding inflammatory changes.  Spleen: Normal in size without focal abnormality.  Adrenals/Urinary Tract: Adrenal glands are unremarkable. Kidneys are normal in size, without obstructing renal calculi or focal lesions. Mild left-sided hydronephrosis is seen. 1 mm and 2 mm nonobstructing renal calculi are seen within the right kidney. 2 mm and 5 mm nonobstructing renal calculi are seen within the mid to lower left kidney. Bladder is unremarkable.  Stomach/Bowel: Stomach is within normal limits. Appendix appears normal. No evidence of bowel wall thickening, distention, or inflammatory  changes.  Vascular/Lymphatic: No significant vascular findings are present. No enlarged abdominal or pelvic lymph nodes.  Reproductive: Uterus and bilateral adnexa are unremarkable.  Other: No abdominal wall hernia or abnormality. No abdominopelvic ascites.  Musculoskeletal: No acute or significant osseous findings.  IMPRESSION: 1. Mild left-sided hydronephrosis without evidence of an obstructing renal calculus. 2. Bilateral nonobstructing renal calculi. 3. Cholelithiasis.   Electronically Signed By: Aram Candela M.D. On: 10/03/2022 01:30   Assessment & Plan:    1. Nephrolithiasis Based on recent CT  Schedule renal ultrasound   2. Pyuria/bacteriuria Presently asymptomatic, however with pregnancy will go ahead and start Cefuroxime 250 mg twice daily, and urine culture was ordered.  I have reviewed the above documentation for accuracy and completeness, and I agree with the above.   Riki Altes, MD  [facility] [address], [address] [place], [place] [address] [phone]

## 2023-06-20 LAB — CULTURE, URINE COMPREHENSIVE

## 2023-06-22 ENCOUNTER — Encounter: Payer: Self-pay | Admitting: *Deleted

## 2023-11-05 ENCOUNTER — Ambulatory Visit
Admission: EM | Admit: 2023-11-05 | Discharge: 2023-11-05 | Disposition: A | Discharge status: Home / Self Care | Attending: Emergency Medicine | Admitting: Emergency Medicine

## 2023-11-05 ENCOUNTER — Encounter: Payer: Self-pay | Admitting: Emergency Medicine

## 2023-11-05 DIAGNOSIS — N390 Urinary tract infection, site not specified: Secondary | ICD-10-CM | POA: Diagnosis present

## 2023-11-05 LAB — URINALYSIS, W/ REFLEX TO CULTURE (INFECTION SUSPECTED)
Bilirubin Urine: NEGATIVE
Glucose, UA: NEGATIVE mg/dL
Ketones, ur: NEGATIVE mg/dL
Nitrite: POSITIVE — AB
Protein, ur: 100 mg/dL — AB
Specific Gravity, Urine: 1.025 (ref 1.005–1.030)
WBC, UA: >50 WBC/hpf (ref 0–5)
pH: 6 (ref 5.0–8.0)

## 2023-11-05 MED ORDER — CEFDINIR 300 MG PO CAPS: 300.0000 mg | ORAL_CAPSULE | Freq: Two times a day (BID) | ORAL | 0 refills | Status: AC

## 2023-11-05 NOTE — ED Provider Notes (Signed)
 [facility]    CSN: [id number] Arrival date & time: 11/05/23  1320      History   Chief Complaint Chief Complaint  Patient presents with   Urinary Frequency    HPI Sherry Townsend is a 33 y.o. female.   HPI  33 year old female with past medical history significant for kidney stones, anemia, and who is 5 weeks postpartum presents for evaluation of urinary urgency and frequency that started today.  She reports that during her entire pregnancy she had a UTI.  She largely does not have symptoms when she has UTIs.  She denies fever, pain with urination, blood in the urine, low back pain, or abdominal pain.  Also no nausea or vomiting.  Past Medical History:  Diagnosis Date   Anemia    Kidney stone    Kidney stone complicating pregnancy 05/14/2015    Patient Active Problem List   Diagnosis Date Noted   Postpartum care following vaginal delivery 09/19/2015   Nephrolithiasis 06/05/2015    Past Surgical History:  Procedure Laterality Date   CYSTOSCOPY W/ RETROGRADES Left 12/28/2015   Procedure: CYSTOSCOPY WITH RETROGRADE PYELOGRAM;  Surgeon: Rosina Riis, MD;  Location: [facility];  Service: Urology;  Laterality: Left;   CYSTOSCOPY WITH STENT PLACEMENT Left 12/28/2015   Procedure: CYSTOSCOPY WITH STENT PLACEMENT;  Surgeon: Rosina Riis, MD;  Location: [facility];  Service: Urology;  Laterality: Left;   URETEROSCOPY WITH HOLMIUM LASER LITHOTRIPSY Left 12/28/2015   Procedure: URETEROSCOPY WITH HOLMIUM LASER LITHOTRIPSY;  Surgeon: Rosina Riis, MD;  Location: [facility];  Service: Urology;  Laterality: Left;   WISDOM TOOTH EXTRACTION      OB History     Gravida  4   Para  2   Term  2   Preterm      AB  1   Living  2      SAB  1   IAB      Ectopic      Multiple  0   Live Births  2            Home Medications    Prior to Admission medications   Medication Sig Start Date End Date Taking? Authorizing Provider  buPROPion (WELLBUTRIN XL) 300 MG  24 hr tablet Take 300 mg by mouth. 08/11/23  Yes [provider]  cefdinir  (OMNICEF ) 300 MG capsule Take 1 capsule (300 mg total) by mouth 2 (two) times daily for 7 days. 11/05/23 11/12/23 Yes Bernardino Ditch, NP  escitalopram  (LEXAPRO ) 20 MG tablet Take 1 tablet (20 mg total) by mouth daily. 04/27/21  Yes Lake Read, MD  ondansetron  (ZOFRAN ) 4 MG tablet Take 1 tablet (4 mg total) by mouth every 8 (eight) hours as needed for nausea or vomiting. 05/14/23   Menshew, Candida LULLA Kings, PA-C    Family History Family History  Problem Relation Age of Onset   Nephrolithiasis Maternal Grandfather    Nephrolithiasis Maternal Aunt    Heart disease Maternal Grandmother    Bladder Cancer Paternal Grandmother     Social History Social History   Tobacco Use   Smoking status: Never   Smokeless tobacco: Never  Vaping Use   Vaping status: Never Used  Substance Use Topics   Alcohol use: Yes    Alcohol/week: 0.0 standard drinks of alcohol    Comment: OCC   Drug use: No     Allergies   Toradol  [ketorolac  tromethamine ]   Review of Systems Review of Systems  Constitutional:  Negative for  fever.  Gastrointestinal:  Negative for abdominal pain, nausea and vomiting.  Genitourinary:  Positive for frequency and urgency. Negative for dysuria and hematuria.  Musculoskeletal:  Negative for back pain.     Physical Exam Triage Vital Signs ED Triage Vitals  Encounter Vitals Group     BP      Girls Systolic BP Percentile      Girls Diastolic BP Percentile      Boys Systolic BP Percentile      Boys Diastolic BP Percentile      Pulse      Resp      Temp      Temp src      SpO2      Weight      Height      Head Circumference      Peak Flow      Pain Score      Pain Loc      Pain Education      Exclude from Growth Chart    No data found.  Updated Vital Signs BP 108/75 (BP Location: Left Arm)   Pulse (!) 114   Temp 97.9 F (36.6 C) (Oral)   Resp 16   Ht 5' 4 (1.626 m)   Wt 192  lb 0.3 oz (87.1 kg)   LMP 12/31/2022   SpO2 99%   Breastfeeding Yes   BMI 32.96 kg/m   Visual Acuity Right Eye Distance:   Left Eye Distance:   Bilateral Distance:    Right Eye Near:   Left Eye Near:    Bilateral Near:     Physical Exam Vitals and nursing note reviewed.  Constitutional:      Appearance: Normal appearance. She is not ill-appearing.  HENT:     Head: Normocephalic and atraumatic.  Cardiovascular:     Rate and Rhythm: Normal rate and regular rhythm.     Pulses: Normal pulses.     Heart sounds: Normal heart sounds. No murmur heard.    No friction rub. No gallop.  Pulmonary:     Effort: Pulmonary effort is normal.     Breath sounds: Normal breath sounds. No wheezing, rhonchi or rales.  Abdominal:     Tenderness: There is no right CVA tenderness or left CVA tenderness.  Skin:    General: Skin is warm and dry.     Capillary Refill: Capillary refill takes less than 2 seconds.     Findings: No rash.  Neurological:     General: No focal deficit present.     Mental Status: She is alert and oriented to person, place, and time.      UC Treatments / Results  Labs (all labs ordered are listed, but only abnormal results are displayed) Labs Reviewed  URINALYSIS, W/ REFLEX TO CULTURE (INFECTION SUSPECTED) - Abnormal; Notable for the following components:      Result Value   APPearance CLOUDY (*)    Hgb urine dipstick MODERATE (*)    Protein, ur 100 (*)    Nitrite POSITIVE (*)    Leukocytes,Ua SMALL (*)    Bacteria, UA MANY (*)    All other components within normal limits  URINE CULTURE    EKG   Radiology No results found.  Procedures Procedures (including critical care time)  Medications Ordered in UC Medications - No data to display  Initial Impression / Assessment and Plan / UC Course  I have reviewed the triage vital signs and the nursing notes.  Pertinent labs &  imaging results that were available during my care of the patient were reviewed  by me and considered in my medical decision making (see chart for details).   Patient is a pleasant, nontoxic-appearing 33 year old female presenting for evaluation of possible UTI symptoms.  She has a history of recurrent UTIs and is 5 weeks postpartum.  She reports that for the middle of her pregnancy on she had a continuous UTI.  She is largely does not have symptoms.  She is only experiencing urgency and frequency at this point.  The patient is also currently breast-feeding.  On exam the patient is not in any acute distress.  She has no CVA tenderness on exam.  I will order a urinalysis to assess for the presence of UTI.  Urinalysis reveals cloudy appearance with moderate hemoglobin, 100 protein, nitrite positive with small leukocyte esterase.  Greater than 50 WBCs, Leven 20 RBCs, and many bacteria.  Urine will reflex to culture.  I will discharge patient with a diagnosis of UTI and start her on cefdinir  300 mg twice daily with food for 7 days given that she is breast-feeding.   Final Clinical Impressions(s) / UC Diagnoses   Final diagnoses:  Lower urinary tract infectious disease     Discharge Instructions      Take the cefdinir  twice daily for 7 days with food for treatment of urinary tract infection.  Increase your oral fluid intake so that you increase your urine production and or flushing your urinary system.  Take an over-the-counter probiotic, such as Culturelle-Align-Activia, 1 hour after each dose of antibiotic to prevent diarrhea or yeast infections from forming.  We will culture urine and change the antibiotics if necessary.  Return for reevaluation, or see your primary care provider, for any new or worsening symptoms.      ED Prescriptions     Medication Sig Dispense Auth. Provider   cefdinir  (OMNICEF ) 300 MG capsule Take 1 capsule (300 mg total) by mouth 2 (two) times daily for 7 days. 14 capsule Bernardino Ditch, NP      PDMP not reviewed this encounter.   Bernardino Ditch, NP 11/05/23 1424

## 2023-11-05 NOTE — ED Triage Notes (Signed)
 Pt c/o urinary frequency. Started today. She states she just had a baby and she had UTI through out her entire pregnancy.

## 2023-11-05 NOTE — Discharge Instructions (Addendum)
Take the cefdinir twice daily for 7 days with food for treatment of urinary tract infection.  Increase your oral fluid intake so that you increase your urine production and or flushing your urinary system.  Take an over-the-counter probiotic, such as Culturelle-Align-Activia, 1 hour after each dose of antibiotic to prevent diarrhea or yeast infections from forming.  We will culture urine and change the antibiotics if necessary.  Return for reevaluation, or see your primary care provider, for any new or worsening symptoms.

## 2023-11-08 ENCOUNTER — Ambulatory Visit (HOSPITAL_COMMUNITY): Payer: Self-pay

## 2023-11-08 LAB — URINE CULTURE: Culture: 80000 — AB

## 2023-11-17 ENCOUNTER — Ambulatory Visit
Admission: RE | Admit: 2023-11-17 | Discharge: 2023-11-17 | Disposition: A | Discharge status: Home / Self Care | Source: Ambulatory Visit | Attending: Internal Medicine | Admitting: Internal Medicine

## 2023-11-17 VITALS — BP 118/81 | HR 112 | Temp 97.7°F | Ht 64.0 in | Wt 167.0 lb

## 2023-11-17 DIAGNOSIS — N390 Urinary tract infection, site not specified: Secondary | ICD-10-CM

## 2023-11-17 LAB — URINALYSIS, W/ REFLEX TO CULTURE (INFECTION SUSPECTED)
Bilirubin Urine: NEGATIVE
Glucose, UA: NEGATIVE mg/dL
Hgb urine dipstick: NEGATIVE
Ketones, ur: NEGATIVE mg/dL
Nitrite: NEGATIVE
Protein, ur: NEGATIVE mg/dL
Specific Gravity, Urine: 1.01 (ref 1.005–1.030)
WBC, UA: >50 WBC/hpf (ref 0–5)
pH: 6 (ref 5.0–8.0)

## 2023-11-17 MED ORDER — CEFDINIR 300 MG PO CAPS: 300.0000 mg | ORAL_CAPSULE | Freq: Two times a day (BID) | ORAL | 0 refills | Status: DC

## 2023-11-17 NOTE — Discharge Instructions (Addendum)
 Follow up with your primary care 3-5 days after you complete the antibiotic to have your urine rechecked.

## 2023-11-17 NOTE — ED Triage Notes (Signed)
 Pt is with her husband Donnice  Pt c/o possible UTI or kidney stone x1day  Pt was recently pregnant   Pt has been taking ibuprofen  and tylenol .   Pt states that the pain was in the left groin last night and now has moved to her lower back.   Pt denies vaginal odor and itching.

## 2023-11-17 NOTE — ED Provider Notes (Signed)
 [facility]    CSN: [id number] Arrival date & time: 11/17/23  1731      History   Chief Complaint Chief Complaint  Patient presents with   Flank Pain    HPI Sherry Townsend is a 33 y.o. female who presents with her husband due to to having L groin pain last night and now has moved to her sacral  lower back. She had her last child 09/28/23 and is breast feeding. Has had 4-5 UTI's in the past year. Her last one was 7/27 and was treated  with Cefdinir .  Has been taking Ibuprofen  and Tylenol .  She denies vaginal discharge, odor or itching.     Past Medical History:  Diagnosis Date   Anemia    Kidney stone    Kidney stone complicating pregnancy 05/14/2015    Patient Active Problem List   Diagnosis Date Noted   Postpartum care following vaginal delivery 09/19/2015   Nephrolithiasis 06/05/2015    Past Surgical History:  Procedure Laterality Date   CYSTOSCOPY W/ RETROGRADES Left 12/28/2015   Procedure: CYSTOSCOPY WITH RETROGRADE PYELOGRAM;  Surgeon: Rosina Riis, MD;  Location: [facility];  Service: Urology;  Laterality: Left;   CYSTOSCOPY WITH STENT PLACEMENT Left 12/28/2015   Procedure: CYSTOSCOPY WITH STENT PLACEMENT;  Surgeon: Rosina Riis, MD;  Location: [facility];  Service: Urology;  Laterality: Left;   URETEROSCOPY WITH HOLMIUM LASER LITHOTRIPSY Left 12/28/2015   Procedure: URETEROSCOPY WITH HOLMIUM LASER LITHOTRIPSY;  Surgeon: Rosina Riis, MD;  Location: [facility];  Service: Urology;  Laterality: Left;   WISDOM TOOTH EXTRACTION      OB History     Gravida  4   Para  2   Term  2   Preterm      AB  1   Living  2      SAB  1   IAB      Ectopic      Multiple  0   Live Births  2            Home Medications    Prior to Admission medications   Medication Sig Start Date End Date Taking? Authorizing Provider  buPROPion (WELLBUTRIN XL) 300 MG 24 hr tablet Take 300 mg by mouth. 08/11/23  Yes [provider]  cefdinir  (OMNICEF ) 300  MG capsule Take 1 capsule (300 mg total) by mouth 2 (two) times daily. 11/17/23  Yes Rodriguez-Southworth, Alsace Dowd, PA-C  escitalopram  (LEXAPRO ) 20 MG tablet Take 1 tablet (20 mg total) by mouth daily. 04/27/21  Yes Lake Read, MD  ondansetron  (ZOFRAN ) 4 MG tablet Take 1 tablet (4 mg total) by mouth every 8 (eight) hours as needed for nausea or vomiting. 05/14/23  Yes Menshew, Candida LULLA Kings, PA-C    Family History Family History  Problem Relation Age of Onset   Nephrolithiasis Maternal Grandfather    Nephrolithiasis Maternal Aunt    Heart disease Maternal Grandmother    Bladder Cancer Paternal Grandmother     Social History Social History   Tobacco Use   Smoking status: Never   Smokeless tobacco: Never  Vaping Use   Vaping status: Never Used  Substance Use Topics   Alcohol use: Not Currently    Comment: OCC   Drug use: No     Allergies   Toradol  [ketorolac  tromethamine ]   Review of Systems Review of Systems As noted in HPI  Physical Exam Triage Vital Signs ED Triage Vitals  Encounter Vitals Group     BP 11/17/23  1748 118/81     Girls Systolic BP Percentile --      Girls Diastolic BP Percentile --      Boys Systolic BP Percentile --      Boys Diastolic BP Percentile --      Pulse Rate 11/17/23 1748 (!) 112     Resp --      Temp 11/17/23 1748 97.7 F (36.5 C)     Temp Source 11/17/23 1748 Oral     SpO2 11/17/23 1748 100 %     Weight 11/17/23 1751 167 lb (75.8 kg)     Height 11/17/23 1751 5' 4 (1.626 m)     Head Circumference --      Peak Flow --      Pain Score 11/17/23 1748 6     Pain Loc --      Pain Education --      Exclude from Growth Chart --    No data found.  Updated Vital Signs BP 118/81 (BP Location: Left Arm)   Pulse (!) 112   Temp 97.7 F (36.5 C) (Oral)   Ht 5' 4 (1.626 m)   Wt 167 lb (75.8 kg)   LMP 12/31/2022   SpO2 100%   Breastfeeding Yes   BMI 28.67 kg/m   Visual Acuity Right Eye Distance:   Left Eye Distance:    Bilateral Distance:    Right Eye Near:   Left Eye Near:    Bilateral Near:     Physical Exam Physical Exam Vitals and nursing note reviewed.  Constitutional:      General: She is not in acute distress.    Appearance: She is not toxic-appearing.  HENT:     Head: Normocephalic.     Right Ear: External ear normal.     Left Ear: External ear normal.  Eyes:     General: No scleral icterus.    Conjunctiva/sclera: Conjunctivae normal.  Pulmonary:     Effort: Pulmonary effort is normal.  Abdominal:     General: Bowel sounds are normal.     Palpations: Abdomen is soft. There is no mass.     Tenderness: There is no guarding or rebound.     Comments: - CVA tenderness   Musculoskeletal:        General: Normal range of motion.     Cervical back: Neck supple.     Comments: BACK- no muscular tenderness on area of complaint with palpation  Skin:    General: Skin is warm and dry.     Findings: No rash.  Neurological:     Mental Status: She is alert and oriented to person, place, and time.     Gait: Gait normal.  Psychiatric:        Mood and Affect: Mood normal.        Behavior: Behavior normal.        Thought Content: Thought content normal.        Judgment: Judgment normal.    UC Treatments / Results  Labs (all labs ordered are listed, but only abnormal results are displayed) Labs Reviewed  URINALYSIS, W/ REFLEX TO CULTURE (INFECTION SUSPECTED) - Abnormal; Notable for the following components:      Result Value   APPearance HAZY (*)    Leukocytes,Ua MODERATE (*)    Bacteria, UA FEW (*)    All other components within normal limits  URINE CULTURE    EKG   Radiology No results found.  Procedures Procedures (including critical care  time)  Medications Ordered in UC Medications - No data to display  Initial Impression / Assessment and Plan / UC Course  I have reviewed the triage vital signs and the nursing notes.  Pertinent labs  results that were available during  my care of the patient were reviewed by me and considered in my medical decision making (see chart for details).  Recurrent UTI  I placed her on Cefdinir  as noted for 10 days and needs to FU with PCP for test of cure.  Urine culture was ordered and we will call her if we need to change her medication.    Final Clinical Impressions(s) / UC Diagnoses   Final diagnoses:  Recurrent UTI     Discharge Instructions      Follow up with your primary care 3-5 days after you complete the antibiotic to have your urine rechecked.       ED Prescriptions     Medication Sig Dispense Auth. Provider   cefdinir  (OMNICEF ) 300 MG capsule Take 1 capsule (300 mg total) by mouth 2 (two) times daily. 20 capsule Rodriguez-Southworth, Kieron Kantner, PA-C      PDMP not reviewed this encounter.   Lindi Carter, PA-C 11/17/23 1832

## 2023-11-20 ENCOUNTER — Ambulatory Visit (HOSPITAL_COMMUNITY): Payer: Self-pay

## 2023-11-20 LAB — URINE CULTURE
Culture: 50000 — AB
Special Requests: NORMAL

## 2023-11-28 ENCOUNTER — Other Ambulatory Visit: Payer: Self-pay

## 2023-11-28 ENCOUNTER — Telehealth: Payer: Self-pay

## 2023-11-28 DIAGNOSIS — N200 Calculus of kidney: Secondary | ICD-10-CM

## 2023-11-28 DIAGNOSIS — R8271 Bacteriuria: Secondary | ICD-10-CM

## 2023-11-28 NOTE — H&P (View-Only) (Signed)
 11/29/2023 5:41 PM   Sherry Townsend [birth year] [id number]  Referring provider: No referring provider defined for this encounter.  Urological history: 1.  Nephrolithiasis - Stone composition of 50% calcium  oxalate monohydrate, 30% calcium  oxalate dihydrate and 20% calcium  phosphate - Left URS (2017) - CT renal stone (09/2022) bilateral nonobstructing renal calculi   2. rUTI's - November 17, 2023-E. Coli - November 05, 2023-E. Coli - October 05, 2023- E. Coli - September 23, 2023-E. Coli - July 06, 2023-E. Coli - June 16, 2023-MDRO E. Coli - May 14, 2023-E. Coli - April 24, 2023-E. Coli - February 23, 2024-E. Coli  Chief Complaint  Patient presents with   Establish Care   Recurrent UTI   HPI: Sherry Townsend is a 33 y.o. woman who presents today for possible stones.   Previous records reviewed.   She has been battling recurrent urinary tract infections intermittently over the last year.  She states her infections are heralded by a slight smell and left-sided pain.  Patient denies any modifying or aggravating factors.  Patient denies any recent UTI's, gross hematuria, dysuria or suprapubic/flank pain.  Patient denies any fevers, chills, nausea or vomiting.    UA positive for pyuria  KUB 2 cm left UPJ stone   Serum creatinine (09/2023) 1.2, eGFR 61  STAT CT revealed a hydronephrosis secondary to a 16 mm lamellated stone in the left renal pelvis along with a 13 mm stone in the UPJ and smaller stones in the dilated intra renal collecting system.  There is also an incidental finding of a 2 cm lesion in the liver.    PMH: Past Medical History:  Diagnosis Date   Anemia    Kidney stone    Kidney stone complicating pregnancy 05/14/2015    Surgical History: Past Surgical History:  Procedure Laterality Date   CYSTOSCOPY W/ RETROGRADES Left 12/28/2015   Procedure: CYSTOSCOPY WITH RETROGRADE PYELOGRAM;  Surgeon: Rosina Riis, MD;  Location: [facility];  Service: Urology;   Laterality: Left;   CYSTOSCOPY WITH STENT PLACEMENT Left 12/28/2015   Procedure: CYSTOSCOPY WITH STENT PLACEMENT;  Surgeon: Rosina Riis, MD;  Location: [facility];  Service: Urology;  Laterality: Left;   URETEROSCOPY WITH HOLMIUM LASER LITHOTRIPSY Left 12/28/2015   Procedure: URETEROSCOPY WITH HOLMIUM LASER LITHOTRIPSY;  Surgeon: Rosina Riis, MD;  Location: [facility];  Service: Urology;  Laterality: Left;   WISDOM TOOTH EXTRACTION      Home Medications:  Allergies as of 11/29/2023       Reactions   Toradol  [ketorolac  Tromethamine ] Other (See Comments)   Numbness of legs        Medication List        Accurate as of November 29, 2023 11:59 PM. If you have any questions, ask your nurse or doctor.          buPROPion 300 MG 24 hr tablet Commonly known as: WELLBUTRIN XL Take 300 mg by mouth.   cefdinir  300 MG capsule Commonly known as: OMNICEF  Take 1 capsule (300 mg total) by mouth 2 (two) times daily.   escitalopram  20 MG tablet Commonly known as: Lexapro  Take 1 tablet (20 mg total) by mouth daily.   ondansetron  4 MG tablet Commonly known as: ZOFRAN  Take 1 tablet (4 mg total) by mouth every 8 (eight) hours as needed for nausea or vomiting.        Allergies:  Allergies  Allergen Reactions   Toradol  [Ketorolac  Tromethamine ] Other (See Comments)    Numbness of legs  Family History: Family History  Problem Relation Age of Onset   Nephrolithiasis Maternal Grandfather    Nephrolithiasis Maternal Aunt    Heart disease Maternal Grandmother    Bladder Cancer Paternal Grandmother     Social History: See HPI for pertinent social history  ROS: Pertinent ROS in HPI  Physical Exam: BP 125/82 (BP Location: Left Arm, Patient Position: Sitting, Cuff Size: Normal)   Pulse 81   Ht 5' 4 (1.626 m)   Wt 169 lb (76.7 kg)   LMP 12/31/2022   SpO2 100%   BMI 29.01 kg/m   Constitutional:  Well nourished. Alert and oriented, No acute distress. HEENT: [place] AT, moist mucus  membranes.  Trachea midline Cardiovascular: No clubbing, cyanosis, or edema. Respiratory: Normal respiratory effort, no increased work of breathing. Neurologic: Grossly intact, no focal deficits, moving all 4 extremities. Psychiatric: Normal mood and affect.    Laboratory Data: See EPIC and HPI  I have reviewed the labs.   Pertinent Imaging: CLINICAL DATA:  Kidney stones.   EXAM: ABDOMEN - 1 VIEW   COMPARISON:  Subsequent abdominopelvic CT, available at time of radiograph interpretation.   FINDINGS: Left renal calculi, including a lamellated stone in the region of the renal pelvis measuring 18 mm. The punctate right renal stones on CT are not well demonstrated by radiograph. Multiple pelvic phleboliths. Normal bowel gas pattern, no obstruction.   IMPRESSION: Left renal calculi, including a lamellated stone in the region of the renal pelvis measuring 18 mm.     Electronically Signed   By: Andrea Gasman M.D.   On: 11/29/2023 18:53  CLINICAL DATA:  Left flank pain.  Kidney stones suspected.   EXAM: CT ABDOMEN AND PELVIS WITHOUT CONTRAST   TECHNIQUE: Multidetector CT imaging of the abdomen and pelvis was performed following the standard protocol without IV contrast.   RADIATION DOSE REDUCTION: This exam was performed according to the departmental dose-optimization program which includes automated exposure control, adjustment of the mA and/or kV according to patient size and/or use of iterative reconstruction technique.   COMPARISON:  10/03/2022   FINDINGS: Lower chest: No acute findings.   Hepatobiliary: New 2.2 x 2.2 cm low-density lesion identified in the central liver. Immediately adjacent area of subcapsular low-density in the medial segment left liver noted on image 21/2. Gallbladder is contracted with calcified stones evident. No intrahepatic or extrahepatic biliary dilation.   Pancreas: No focal mass lesion. No dilatation of the main duct.  No intraparenchymal cyst. No peripancreatic edema.   Spleen: No splenomegaly. No suspicious focal mass lesion.   Adrenals/Urinary Tract: No adrenal nodule or mass. Several punctate nonobstructing stones are seen in the lower pole right kidney. Right ureter unremarkable.   Interval progression of left-sided hydronephrosis with multiple left renal stones evident including a 16 mm lamellated calcified stone in the renal pelvis. 13 x 12 x 9 mm stone is identified at the left UPJ. Additional smaller stones are seen in the dilated left intrarenal collecting system. No substantial left hydroureter below the level of the UPJ.   Bladder is decompressed.   Stomach/Bowel: Stomach is unremarkable. No gastric wall thickening. No evidence of outlet obstruction. Duodenum is normally positioned as is the ligament of Treitz. No small bowel wall thickening. No small bowel dilatation. The terminal ileum is normal. The appendix is normal. No gross colonic mass. No colonic wall thickening.   Vascular/Lymphatic: No abdominal aortic aneurysm. No abdominal aortic atherosclerotic calcification. Borderline lymphadenopathy seen in the gastrohepatic ligament. Borderline enlarged left retroperitoneal  lymph nodes measure up to 10 mm short axis (36/2) No pelvic sidewall lymphadenopathy.   Reproductive: The uterus is unremarkable.  There is no adnexal mass.   Other: No intraperitoneal free fluid.   Musculoskeletal: No worrisome lytic or sclerotic osseous abnormality.   IMPRESSION: 1. Interval progression of left-sided hydronephrosis with multiple left renal stones including a 16 mm lamellated calcified stone in the renal pelvis. 13 x 12 x 9 mm stone is identified at the left UPJ. Additional smaller stones are seen in the dilated left intrarenal collecting system. No substantial left hydroureter below the level of the UPJ. 2. Several punctate nonobstructing stones in the lower pole right kidney. 3. New  2.2 x 2.2 cm low-density lesion in the central liver with immediately adjacent area of subcapsular low-density in the medial segment left liver. This is indeterminate on noncontrast imaging but may reflect focal fatty deposition. MRI of the abdomen with and without contrast recommended to further evaluate. 4. Borderline lymphadenopathy in the gastrohepatic ligament and left retroperitoneum. Attention on follow-up recommended. 5. Cholelithiasis.     Electronically Signed   By: Camellia Candle M.D.   On: 11/29/2023 15:24 I have independently reviewed the images.  See HPI.   Assessment & Plan:    1. Left flank pain - UA w/ pyruia - KUB left renal calculi  - urine culture pending - STAT CT w/ hydro 2nd to a 13 mm stone in the left UPJ along with a 16 mm lamellated calcified stone in the renal pelvis and additional small stone in the left kidney  2. Left nephrolithiasis - patient was notified by phone regarding her CT results and recommendations to proceed with left ureteroscopy with laser lithotripsy and left ureteral stent placement -she has had the procedure in the past so she is familiar with the procedure and post-operative course -we discussed that because she has a large and dense stone burden, it will be difficult to completely clear her of the stones with one procedure and will likely need to be taking back to the OR for a staged procedure -we reviewed that the stent can be associated with flank pain, bladder pain, dysuria, urgency, frequency, urinary leakage, and gross hematuria and that the risks of the procedure are bleeding, infection, and rarely an injury to the ureter that may require another surgery to repair the ureter - urine culture is pending, but we will have her continue Omnicef  until surgery and will adjust if necessary once culture results are available  3. Liver lesion - explained that I would like her to see GI for further evaluation  - she will contact me if she is  not able to see GI in a timely manner and if that is the case, I will order the MRI of the liver for further evaluation   Return for left URS/LL/stent placement for two large stones in the left UPJ and pelvis .  These notes generated with voice recognition software. I apologize for typographical errors.  CLOTILDA HELON RIGGERS  [facility] [address]  [address] [place], [place] [address] [phone]   Surgical Physician Order Form [facility]  * Scheduling expectation : Next available   *Length of Case:   *Clearance needed: no  *Anticoagulation Instructions: Hold all anticoagulants  *Aspirin Instructions: Hold Aspirin  *Post-op visit Date/Instructions:  TBD  *Diagnosis: Left UPJ Stone  *Procedure: left Ureteroscopy w/laser lithotripsy & stent placement (47643)   Additional orders: N/A  -Admit type:  OUTpatient  -Anesthesia: General  -VTE Prophylaxis Standing Order SCD's       Other:   -Standing Lab Orders Per Anesthesia    Lab other: Urine Pregnancy  -Standing Test orders EKG/Chest x-ray per Anesthesia       Test other:   - Medications:  Ancef  2gm IV  -Other orders:  N/A

## 2023-11-28 NOTE — Progress Notes (Signed)
 11/29/2023 5:41 PM   Sherry Townsend [birth year] [id number]  Referring provider: No referring provider defined for this encounter.  Urological history: 1.  Nephrolithiasis - Stone composition of 50% calcium  oxalate monohydrate, 30% calcium  oxalate dihydrate and 20% calcium  phosphate - Left URS (2017) - CT renal stone (09/2022) bilateral nonobstructing renal calculi   2. rUTI's - November 17, 2023-E. Coli - November 05, 2023-E. Coli - October 05, 2023- E. Coli - September 23, 2023-E. Coli - July 06, 2023-E. Coli - June 16, 2023-MDRO E. Coli - May 14, 2023-E. Coli - April 24, 2023-E. Coli - February 23, 2024-E. Coli  Chief Complaint  Patient presents with   Establish Care   Recurrent UTI   HPI: Sherry Townsend is a 33 y.o. woman who presents today for possible stones.   Previous records reviewed.   She has been battling recurrent urinary tract infections intermittently over the last year.  She states her infections are heralded by a slight smell and left-sided pain.  Patient denies any modifying or aggravating factors.  Patient denies any recent UTI's, gross hematuria, dysuria or suprapubic/flank pain.  Patient denies any fevers, chills, nausea or vomiting.    UA positive for pyuria  KUB 2 cm left UPJ stone   Serum creatinine (09/2023) 1.2, eGFR 61  STAT CT revealed a hydronephrosis secondary to a 16 mm lamellated stone in the left renal pelvis along with a 13 mm stone in the UPJ and smaller stones in the dilated intra renal collecting system.  There is also an incidental finding of a 2 cm lesion in the liver.    PMH: Past Medical History:  Diagnosis Date   Anemia    Kidney stone    Kidney stone complicating pregnancy 05/14/2015    Surgical History: Past Surgical History:  Procedure Laterality Date   CYSTOSCOPY W/ RETROGRADES Left 12/28/2015   Procedure: CYSTOSCOPY WITH RETROGRADE PYELOGRAM;  Surgeon: Rosina Riis, MD;  Location: [facility] ORS;  Service: Urology;   Laterality: Left;   CYSTOSCOPY WITH STENT PLACEMENT Left 12/28/2015   Procedure: CYSTOSCOPY WITH STENT PLACEMENT;  Surgeon: Rosina Riis, MD;  Location: [facility] OR[facility];  Service: Urology;  Laterality: Left;   URETEROSCOPY WITH HOLMIUM LASER LITHOTRIPSY Left 12/28/2015   Procedure: URETEROSCOPY WITH HOLMIUM LASER LITHOTRIPSY;  Surgeon: Rosina Riis, MD;  Location: [facility];  Service: Urology;  Laterality: Left;   WISDOM TOOTH EXTRACTION      Home Medications:  Allergies as of 11/29/2023       Reactions   Toradol  [ketorolac  Tromethamine ] Other (See Comments)   Numbness of legs        Medication List        Accurate as of November 29, 2023 11:59 PM. If you have any questions, ask your nurse or doctor.          buPROPion 300 MG 24 hr tablet Commonly known as: WELLBUTRIN XL Take 300 mg by mouth.   cefdinir  300 MG capsule Commonly known as: OMNICEF  Take 1 capsule (300 mg total) by mouth 2 (two) times daily.   escitalopram  20 MG tablet Commonly known as: Lexapro  Take 1 tablet (20 mg total) by mouth daily.   ondansetron  4 MG tablet Commonly known as: ZOFRAN  Take 1 tablet (4 mg total) by mouth every 8 (eight) hours as needed for nausea or vomiting.        Allergies:  Allergies  Allergen Reactions   Toradol  [Ketorolac  Tromethamine ] Other (See Comments)    Numbness of legs  Family History: Family History  Problem Relation Age of Onset   Nephrolithiasis Maternal Grandfather    Nephrolithiasis Maternal Aunt    Heart disease Maternal Grandmother    Bladder Cancer Paternal Grandmother     Social History: See HPI for pertinent social history  ROS: Pertinent ROS in HPI  Physical Exam: BP 125/82 (BP Location: Left Arm, Patient Position: Sitting, Cuff Size: Normal)   Pulse 81   Ht 5' 4 (1.626 m)   Wt 169 lb (76.7 kg)   LMP 12/31/2022   SpO2 100%   BMI 29.01 kg/m   Constitutional:  Well nourished. Alert and oriented, No acute distress. HEENT: [place] AT, moist mucus  membranes.  Trachea midline Cardiovascular: No clubbing, cyanosis, or edema. Respiratory: Normal respiratory effort, no increased work of breathing. Neurologic: Grossly intact, no focal deficits, moving all 4 extremities. Psychiatric: Normal mood and affect.    Laboratory Data: See EPIC and HPI  I have reviewed the labs.   Pertinent Imaging: CLINICAL DATA:  Kidney stones.   EXAM: ABDOMEN - 1 VIEW   COMPARISON:  Subsequent abdominopelvic CT, available at time of radiograph interpretation.   FINDINGS: Left renal calculi, including a lamellated stone in the region of the renal pelvis measuring 18 mm. The punctate right renal stones on CT are not well demonstrated by radiograph. Multiple pelvic phleboliths. Normal bowel gas pattern, no obstruction.   IMPRESSION: Left renal calculi, including a lamellated stone in the region of the renal pelvis measuring 18 mm.     Electronically Signed   By: Andrea Gasman M.D.   On: 11/29/2023 18:53  CLINICAL DATA:  Left flank pain.  Kidney stones suspected.   EXAM: CT ABDOMEN AND PELVIS WITHOUT CONTRAST   TECHNIQUE: Multidetector CT imaging of the abdomen and pelvis was performed following the standard protocol without IV contrast.   RADIATION DOSE REDUCTION: This exam was performed according to the departmental dose-optimization program which includes automated exposure control, adjustment of the mA and/or kV according to patient size and/or use of iterative reconstruction technique.   COMPARISON:  10/03/2022   FINDINGS: Lower chest: No acute findings.   Hepatobiliary: New 2.2 x 2.2 cm low-density lesion identified in the central liver. Immediately adjacent area of subcapsular low-density in the medial segment left liver noted on image 21/2. Gallbladder is contracted with calcified stones evident. No intrahepatic or extrahepatic biliary dilation.   Pancreas: No focal mass lesion. No dilatation of the main duct.  No intraparenchymal cyst. No peripancreatic edema.   Spleen: No splenomegaly. No suspicious focal mass lesion.   Adrenals/Urinary Tract: No adrenal nodule or mass. Several punctate nonobstructing stones are seen in the lower pole right kidney. Right ureter unremarkable.   Interval progression of left-sided hydronephrosis with multiple left renal stones evident including a 16 mm lamellated calcified stone in the renal pelvis. 13 x 12 x 9 mm stone is identified at the left UPJ. Additional smaller stones are seen in the dilated left intrarenal collecting system. No substantial left hydroureter below the level of the UPJ.   Bladder is decompressed.   Stomach/Bowel: Stomach is unremarkable. No gastric wall thickening. No evidence of outlet obstruction. Duodenum is normally positioned as is the ligament of Treitz. No small bowel wall thickening. No small bowel dilatation. The terminal ileum is normal. The appendix is normal. No gross colonic mass. No colonic wall thickening.   Vascular/Lymphatic: No abdominal aortic aneurysm. No abdominal aortic atherosclerotic calcification. Borderline lymphadenopathy seen in the gastrohepatic ligament. Borderline enlarged left retroperitoneal  lymph nodes measure up to 10 mm short axis (36/2) No pelvic sidewall lymphadenopathy.   Reproductive: The uterus is unremarkable.  There is no adnexal mass.   Other: No intraperitoneal free fluid.   Musculoskeletal: No worrisome lytic or sclerotic osseous abnormality.   IMPRESSION: 1. Interval progression of left-sided hydronephrosis with multiple left renal stones including a 16 mm lamellated calcified stone in the renal pelvis. 13 x 12 x 9 mm stone is identified at the left UPJ. Additional smaller stones are seen in the dilated left intrarenal collecting system. No substantial left hydroureter below the level of the UPJ. 2. Several punctate nonobstructing stones in the lower pole right kidney. 3. New  2.2 x 2.2 cm low-density lesion in the central liver with immediately adjacent area of subcapsular low-density in the medial segment left liver. This is indeterminate on noncontrast imaging but may reflect focal fatty deposition. MRI of the abdomen with and without contrast recommended to further evaluate. 4. Borderline lymphadenopathy in the gastrohepatic ligament and left retroperitoneum. Attention on follow-up recommended. 5. Cholelithiasis.     Electronically Signed   By: Camellia Candle M.D.   On: 11/29/2023 15:24 I have independently reviewed the images.  See HPI.   Assessment & Plan:    1. Left flank pain - UA w/ pyruia - KUB left renal calculi  - urine culture pending - STAT CT w/ hydro 2nd to a 13 mm stone in the left UPJ along with a 16 mm lamellated calcified stone in the renal pelvis and additional small stone in the left kidney  2. Left nephrolithiasis - patient was notified by phone regarding her CT results and recommendations to proceed with left ureteroscopy with laser lithotripsy and left ureteral stent placement -she has had the procedure in the past so she is familiar with the procedure and post-operative course -we discussed that because she has a large and dense stone burden, it will be difficult to completely clear her of the stones with one procedure and will likely need to be taking back to the OR for a staged procedure -we reviewed that the stent can be associated with flank pain, bladder pain, dysuria, urgency, frequency, urinary leakage, and gross hematuria and that the risks of the procedure are bleeding, infection, and rarely an injury to the ureter that may require another surgery to repair the ureter - urine culture is pending, but we will have her continue Omnicef  until surgery and will adjust if necessary once culture results are available  3. Liver lesion - explained that I would like her to see GI for further evaluation  - she will contact me if she is  not able to see GI in a timely manner and if that is the case, I will order the MRI of the liver for further evaluation   Return for left URS/LL/stent placement for two large stones in the left UPJ and pelvis .  These notes generated with voice recognition software. I apologize for typographical errors.  CLOTILDA HELON RIGGERS  [facility] [address]  [address] [place], [place] [address] [phone]   Surgical Physician Order Form [facility]  * Scheduling expectation : Next available   *Length of Case:   *Clearance needed: no  *Anticoagulation Instructions: Hold all anticoagulants  *Aspirin Instructions: Hold Aspirin  *Post-op visit Date/Instructions:  TBD  *Diagnosis: Left UPJ Stone  *Procedure: left Ureteroscopy w/laser lithotripsy & stent placement (47643)   Additional orders: N/A  -Admit type:  OUTpatient  -Anesthesia: General  -VTE Prophylaxis Standing Order SCD's       Other:   -Standing Lab Orders Per Anesthesia    Lab other: Urine Pregnancy  -Standing Test orders EKG/Chest x-ray per Anesthesia       Test other:   - Medications:  Ancef  2gm IV  -Other orders:  N/A

## 2023-11-28 NOTE — Telephone Encounter (Signed)
 Pts husband called in and patient is having more pain and thinks she might have a kidney stone. She does have a Hx of them in past. Pt was recently treated for a UTI and is still on the antibiotics. Pt is having sweats and pain that is barely relieved with around the clock ibuprfen. Discussed with Sam PA to move up appointment to a same day for tomorrow with [facility] PA. KUB order and instructed pt to get xray prior to appt. Also instructed pt that if the start having a fever to please seek immediate attention in the ED.

## 2023-11-29 ENCOUNTER — Ambulatory Visit
Admission: RE | Admit: 2023-11-29 | Discharge: 2023-11-29 | Disposition: A | Discharge status: Home / Self Care | Source: Ambulatory Visit | Attending: Urology | Admitting: Urology

## 2023-11-29 ENCOUNTER — Ambulatory Visit: Admitting: Urology

## 2023-11-29 ENCOUNTER — Ambulatory Visit: Payer: Self-pay | Admitting: Urology

## 2023-11-29 ENCOUNTER — Other Ambulatory Visit
Admission: RE | Admit: 2023-11-29 | Discharge: 2023-11-29 | Disposition: A | Discharge status: Home / Self Care | Source: Ambulatory Visit | Attending: Urology | Admitting: Urology

## 2023-11-29 VITALS — BP 125/82 | HR 81 | Ht 64.0 in | Wt 169.0 lb

## 2023-11-29 DIAGNOSIS — R1030 Lower abdominal pain, unspecified: Secondary | ICD-10-CM | POA: Diagnosis present

## 2023-11-29 DIAGNOSIS — N200 Calculus of kidney: Secondary | ICD-10-CM | POA: Diagnosis present

## 2023-11-29 DIAGNOSIS — R109 Unspecified abdominal pain: Secondary | ICD-10-CM

## 2023-11-29 DIAGNOSIS — N201 Calculus of ureter: Secondary | ICD-10-CM

## 2023-11-29 DIAGNOSIS — N390 Urinary tract infection, site not specified: Secondary | ICD-10-CM | POA: Diagnosis present

## 2023-11-29 LAB — URINALYSIS, COMPLETE
Bilirubin, UA: NEGATIVE
Glucose, UA: NEGATIVE
Ketones, UA: NEGATIVE
Leukocytes,UA: NEGATIVE
Nitrite, UA: NEGATIVE
Specific Gravity, UA: 1.025 (ref 1.005–1.030)
Urobilinogen, Ur: 0.2 mg/dL (ref 0.2–1.0)
pH, UA: 6 (ref 5.0–7.5)

## 2023-11-29 LAB — MICROSCOPIC EXAMINATION

## 2023-11-29 LAB — PREGNANCY, URINE: Preg Test, Ur: NEGATIVE

## 2023-11-30 ENCOUNTER — Other Ambulatory Visit: Payer: Self-pay | Admitting: Urology

## 2023-11-30 ENCOUNTER — Telehealth: Payer: Self-pay

## 2023-11-30 ENCOUNTER — Other Ambulatory Visit: Payer: Self-pay

## 2023-11-30 DIAGNOSIS — K769 Liver disease, unspecified: Secondary | ICD-10-CM

## 2023-11-30 DIAGNOSIS — N201 Calculus of ureter: Secondary | ICD-10-CM

## 2023-11-30 MED ORDER — CEFDINIR 300 MG PO CAPS: 300.0000 mg | ORAL_CAPSULE | Freq: Two times a day (BID) | ORAL | 0 refills | Status: AC

## 2023-11-30 NOTE — Telephone Encounter (Signed)
 Per Dr. Francisca, Patient is to be scheduled for Left Ureteroscopy with Laser Lithotripsy and Stent Placement   Sherry Townsend was contacted and possible surgical dates were discussed, Friday August 29th, 2025 was agreed upon for surgery.  Patient was directed to call [phone] between 1-3pm the day before surgery to find out surgical arrival time.  Instructions were given not to eat or drink from midnight on the night before surgery and have a driver for the day of surgery. On the surgery day patient was instructed to enter through the [facility] entrance of [facility] report the Same Day Surgery desk.   Pre-Admit Testing will be in contact via phone to set up an interview with the anesthesia team to review your history and medications prior to surgery.   Reminder of this information was sent via MyChart to the patient.

## 2023-11-30 NOTE — Progress Notes (Signed)
   [facility] Posting Form  Surgery Date: Date: 12/08/2023  Surgeon: Dr. Redell Burnet, MD  Inpt ( No  )   Outpt (Yes)   Obs ( No  )   Diagnosis: N20.1 Left Ureteral Stone  -CPT: [phone]  Surgery: Left Ureteroscopy with Laser Lithotripsy and Stent Placement  Stop Anticoagulations: Yes and also hold ASA  Cardiac/Medical/Pulmonary Clearance needed: no  *Orders entered into EPIC  Date: 11/30/23   *Case booked in [place]  Date: 11/30/23  *Notified pt of Surgery: Date: 11/30/23  PRE-OP UA & CX: no  *Placed into Prior Authorization [place] Date: 11/30/23  Assistant/laser/rep:No

## 2023-11-30 NOTE — Progress Notes (Signed)
 Surgical Physician Order Form [facility] [place]  Dr. Francisca, MD  * Scheduling expectation : Next available   *Length of Case:   *Clearance needed: no  *Anticoagulation Instructions: Hold all anticoagulants  *Aspirin Instructions: Hold Aspirin  *Post-op visit Date/Instructions:   TBD  *Diagnosis: Left UPJ Stone  *Procedure: left Ureteroscopy w/laser lithotripsy & stent placement ([id number])   Additional orders: N/A  -Admit type: OUTpatient  -Anesthesia: General  -VTE Prophylaxis Standing Order SCD's       Other:   -Standing Lab Orders Per Anesthesia    Lab other: Urine Pregnancy  -Standing Test orders EKG/Chest x-ray per Anesthesia       Test other:   - Medications:  Ancef  2gm IV  -Other orders:  N/A

## 2023-12-01 ENCOUNTER — Ambulatory Visit: Admitting: Physician Assistant

## 2023-12-03 ENCOUNTER — Encounter: Payer: Self-pay | Admitting: Urology

## 2023-12-04 LAB — CULTURE, URINE COMPREHENSIVE

## 2023-12-05 ENCOUNTER — Other Ambulatory Visit: Payer: Self-pay

## 2023-12-05 ENCOUNTER — Encounter
Admission: RE | Admit: 2023-12-05 | Discharge: 2023-12-05 | Disposition: A | Discharge status: Home / Self Care | Source: Ambulatory Visit | Attending: Urology | Admitting: Urology

## 2023-12-05 VITALS — Ht 64.0 in | Wt 169.0 lb

## 2023-12-05 DIAGNOSIS — Z01818 Encounter for other preprocedural examination: Secondary | ICD-10-CM

## 2023-12-05 HISTORY — DX: Anxiety disorder, unspecified: F41.9

## 2023-12-05 HISTORY — DX: Depression, unspecified: F32.A

## 2023-12-05 HISTORY — DX: Personal history of urinary calculi: Z87.442

## 2023-12-05 NOTE — Patient Instructions (Signed)
 Your procedure is scheduled on: Friday 12/08/23 Report to the Registration Desk on the 1st floor of the Medical Mall. To find out your arrival time, please call [phone] between 1PM - 3PM on: Thursday 12/07/23 If your arrival time is 6:00 am, do not arrive before that time as the Medical Mall entrance doors do not open until 6:00 am.  REMEMBER: Instructions that are not followed completely may result in serious medical risk, up to and including death; or upon the discretion of your surgeon and anesthesiologist your surgery may need to be rescheduled.  Do not eat food or drink any liquids after midnight the night before surgery.  No gum chewing or hard candies.  One week prior to surgery: Stop Anti-inflammatories (NSAIDS) such as Advil , Aleve, Ibuprofen , Motrin , Naproxen, Naprosyn and Aspirin based products such as Excedrin, Goody's Powder, BC Powder.  You may however, continue to take Tylenol  if needed for pain up until the day of surgery.  Stop ANY OVER THE COUNTER supplements and vitamins until after surgery.  Continue taking all of your other prescription medications up until the day of surgery.  ON THE DAY OF SURGERY ONLY TAKE THESE MEDICATIONS WITH SIPS OF WATER:  buPROPion (WELLBUTRIN XL) 300 MG 24 hr tablet  escitalopram  (LEXAPRO ) 20 MG tablet   No Alcohol for 24 hours before or after surgery.  No Smoking including e-cigarettes for 24 hours before surgery.  No chewable tobacco products for at least 6 hours before surgery.  No nicotine patches on the day of surgery.  Do not use any recreational drugs for at least a week (preferably 2 weeks) before your surgery.  Please be advised that the combination of cocaine and anesthesia may have negative outcomes, up to and including death. If you test positive for cocaine, your surgery will be cancelled.  On the morning of surgery brush your teeth with toothpaste and water, you may rinse your mouth with mouthwash if you wish. Do  not swallow any toothpaste or mouthwash.  Shower prior to arrival the morning of your surgery.  Do not shave body hair from the neck down 48 hours before surgery.  Do not wear lotions, powders, or perfumes.   Wear comfortable clothing (specific to your surgery type) to the hospital.  Do not wear jewelry, make-up, hairpins, clips or nail polish.  For welded (permanent) jewelry: bracelets, anklets, waist bands, etc.  Please have this removed prior to surgery.  If it is not removed, there is a chance that hospital personnel will need to cut it off on the day of surgery.  Contact lenses, hearing aids and dentures may not be worn into surgery. Bring a case for your contacts  Do not bring valuables to the hospital. [facility] is not responsible for any missing/lost belongings or valuables.   Notify your doctor if there is any change in your medical condition (cold, fever, infection).  After surgery, you can help prevent lung complications by doing breathing exercises.  Take deep breaths and cough every 1-2 hours. Your doctor may order a device called an Incentive Spirometer to help you take deep breaths.  If you are being discharged the day of surgery, you will not be allowed to drive home. You will need a responsible individual to drive you home and stay with you for 24 hours after surgery.   Please call the Pre-admissions Testing Dept. at [phone] if you have any questions about these instructions.  Surgery Visitation Policy:  Patients having surgery or a  procedure may have two visitors.  Children under the age of 6 must have an adult with them who is not the patient.   Merchandiser, retail to address health-related social needs:  [email or web address]

## 2023-12-08 ENCOUNTER — Ambulatory Visit
Admission: RE | Admit: 2023-12-08 | Discharge: 2023-12-08 | Disposition: A | Discharge status: Home / Self Care | Attending: Urology | Admitting: Urology

## 2023-12-08 ENCOUNTER — Other Ambulatory Visit: Payer: Self-pay

## 2023-12-08 ENCOUNTER — Encounter
Admission: RE | Disposition: A | Discharge status: Home / Self Care | Payer: Self-pay | Source: Home / Self Care | Attending: Urology

## 2023-12-08 ENCOUNTER — Ambulatory Visit

## 2023-12-08 ENCOUNTER — Ambulatory Visit: Admitting: Anesthesiology

## 2023-12-08 ENCOUNTER — Encounter: Payer: Self-pay | Admitting: Urology

## 2023-12-08 ENCOUNTER — Encounter: Payer: Self-pay | Admitting: Urgent Care

## 2023-12-08 DIAGNOSIS — F419 Anxiety disorder, unspecified: Secondary | ICD-10-CM | POA: Insufficient documentation

## 2023-12-08 DIAGNOSIS — N136 Pyonephrosis: Secondary | ICD-10-CM | POA: Diagnosis not present

## 2023-12-08 DIAGNOSIS — N390 Urinary tract infection, site not specified: Secondary | ICD-10-CM | POA: Diagnosis not present

## 2023-12-08 DIAGNOSIS — Z01818 Encounter for other preprocedural examination: Secondary | ICD-10-CM

## 2023-12-08 DIAGNOSIS — N202 Calculus of kidney with calculus of ureter: Secondary | ICD-10-CM

## 2023-12-08 DIAGNOSIS — F32A Depression, unspecified: Secondary | ICD-10-CM | POA: Insufficient documentation

## 2023-12-08 DIAGNOSIS — N201 Calculus of ureter: Secondary | ICD-10-CM

## 2023-12-08 HISTORY — PX: CYSTOSCOPY/URETEROSCOPY/HOLMIUM LASER/STENT PLACEMENT: SHX6546

## 2023-12-08 LAB — POCT PREGNANCY, URINE: Preg Test, Ur: NEGATIVE

## 2023-12-08 SURGERY — CYSTOSCOPY/URETEROSCOPY/HOLMIUM LASER/STENT PLACEMENT
Anesthesia: General | Laterality: Left

## 2023-12-08 MED ORDER — PROPOFOL 1000 MG/100ML IV EMUL
INTRAVENOUS | Status: AC
  Filled 2023-12-08: qty 100

## 2023-12-08 MED ORDER — LACTATED RINGERS IV SOLN: INTRAVENOUS | Status: DC

## 2023-12-08 MED ORDER — PROPOFOL 500 MG/50ML IV EMUL
INTRAVENOUS | Status: DC | PRN
  Administered 2023-12-08: 150 ug/kg/min via INTRAVENOUS

## 2023-12-08 MED ORDER — CHLORHEXIDINE GLUCONATE 0.12 % MT SOLN
OROMUCOSAL | Status: AC
  Filled 2023-12-08: qty 15

## 2023-12-08 MED ORDER — ROCURONIUM BROMIDE 10 MG/ML (PF) SYRINGE
PREFILLED_SYRINGE | INTRAVENOUS | Status: AC
  Filled 2023-12-08: qty 10

## 2023-12-08 MED ORDER — DEXAMETHASONE SODIUM PHOSPHATE 10 MG/ML IJ SOLN
INTRAMUSCULAR | Status: DC | PRN
  Administered 2023-12-08: 10 mg via INTRAVENOUS

## 2023-12-08 MED ORDER — CEFAZOLIN SODIUM-DEXTROSE 2-4 GM/100ML-% IV SOLN
2.0000 g | INTRAVENOUS | Status: AC
  Administered 2023-12-08: 2 g via INTRAVENOUS

## 2023-12-08 MED ORDER — PROPOFOL 10 MG/ML IV BOLUS
INTRAVENOUS | Status: DC | PRN
  Administered 2023-12-08: 150 mg via INTRAVENOUS

## 2023-12-08 MED ORDER — OXYCODONE HCL 5 MG/5ML PO SOLN: 5.0000 mg | Freq: Once | ORAL | Status: DC | PRN

## 2023-12-08 MED ORDER — MIDAZOLAM HCL 2 MG/2ML IJ SOLN
INTRAMUSCULAR | Status: AC
  Filled 2023-12-08: qty 2

## 2023-12-08 MED ORDER — FENTANYL CITRATE (PF) 100 MCG/2ML IJ SOLN
INTRAMUSCULAR | Status: DC | PRN
  Administered 2023-12-08 (×2): 50 ug via INTRAVENOUS

## 2023-12-08 MED ORDER — SODIUM CHLORIDE 0.9 % IR SOLN
Status: DC | PRN
Start: 2023-12-08 — End: 2023-12-08
  Administered 2023-12-08: 1000 mL via INTRAVESICAL

## 2023-12-08 MED ORDER — DEXAMETHASONE SODIUM PHOSPHATE 10 MG/ML IJ SOLN
INTRAMUSCULAR | Status: AC
  Filled 2023-12-08: qty 1

## 2023-12-08 MED ORDER — PROPOFOL 10 MG/ML IV BOLUS
INTRAVENOUS | Status: AC
  Filled 2023-12-08: qty 20

## 2023-12-08 MED ORDER — IOHEXOL 180 MG/ML  SOLN
INTRAMUSCULAR | Status: DC | PRN
  Administered 2023-12-08: 4 mL

## 2023-12-08 MED ORDER — FENTANYL CITRATE (PF) 100 MCG/2ML IJ SOLN: 25.0000 ug | INTRAMUSCULAR | Status: DC | PRN

## 2023-12-08 MED ORDER — ORAL CARE MOUTH RINSE: 15.0000 mL | Freq: Once | OROMUCOSAL | Status: AC

## 2023-12-08 MED ORDER — LIDOCAINE HCL (PF) 2 % IJ SOLN
INTRAMUSCULAR | Status: AC
  Filled 2023-12-08: qty 5

## 2023-12-08 MED ORDER — LIDOCAINE HCL (CARDIAC) PF 100 MG/5ML IV SOSY
PREFILLED_SYRINGE | INTRAVENOUS | Status: DC | PRN
  Administered 2023-12-08: 60 mg via INTRAVENOUS

## 2023-12-08 MED ORDER — MIDAZOLAM HCL 2 MG/2ML IJ SOLN
INTRAMUSCULAR | Status: DC | PRN
  Administered 2023-12-08: 2 mg via INTRAVENOUS

## 2023-12-08 MED ORDER — CEFDINIR 300 MG PO CAPS: 300.0000 mg | ORAL_CAPSULE | Freq: Two times a day (BID) | ORAL | 1 refills | Status: DC

## 2023-12-08 MED ORDER — CEFAZOLIN SODIUM-DEXTROSE 2-4 GM/100ML-% IV SOLN
INTRAVENOUS | Status: AC
  Filled 2023-12-08: qty 100

## 2023-12-08 MED ORDER — FENTANYL CITRATE (PF) 100 MCG/2ML IJ SOLN
INTRAMUSCULAR | Status: AC
  Filled 2023-12-08: qty 2

## 2023-12-08 MED ORDER — ONDANSETRON HCL 4 MG/2ML IJ SOLN
INTRAMUSCULAR | Status: DC | PRN
  Administered 2023-12-08: 4 mg via INTRAVENOUS

## 2023-12-08 MED ORDER — OXYCODONE HCL 5 MG PO TABS: 5.0000 mg | ORAL_TABLET | Freq: Once | ORAL | Status: DC | PRN

## 2023-12-08 MED ORDER — ROCURONIUM BROMIDE 100 MG/10ML IV SOLN
INTRAVENOUS | Status: DC | PRN
  Administered 2023-12-08: 10 mg via INTRAVENOUS
  Administered 2023-12-08: 50 mg via INTRAVENOUS

## 2023-12-08 MED ORDER — SUGAMMADEX SODIUM 200 MG/2ML IV SOLN
INTRAVENOUS | Status: DC | PRN
  Administered 2023-12-08: 100 mg via INTRAVENOUS
  Administered 2023-12-08: 200 mg via INTRAVENOUS
  Administered 2023-12-08: 100 mg via INTRAVENOUS

## 2023-12-08 MED ORDER — ONDANSETRON HCL 4 MG/2ML IJ SOLN
INTRAMUSCULAR | Status: AC
  Filled 2023-12-08: qty 2

## 2023-12-08 MED ORDER — CHLORHEXIDINE GLUCONATE 0.12 % MT SOLN
15.0000 mL | Freq: Once | OROMUCOSAL | Status: AC
  Administered 2023-12-08: 15 mL via OROMUCOSAL

## 2023-12-08 SURGICAL SUPPLY — 25 items
ADHESIVE MASTISOL STRL (MISCELLANEOUS) IMPLANT
BAG DRAIN SIEMENS DORNER NS (MISCELLANEOUS) ×1 IMPLANT
BAG PRESSURE INF REUSE 3000 (BAG) ×1 IMPLANT
BRUSH SCRUB EZ 4% CHG (MISCELLANEOUS) ×1 IMPLANT
CATH URET FLEX-TIP 2 LUMEN 10F (CATHETERS) IMPLANT
CATH URETL OPEN 5X70 (CATHETERS) IMPLANT
CNTNR URN SCR LID CUP LEK RST (MISCELLANEOUS) IMPLANT
DRAPE UTILITY 15X26 TOWEL STRL (DRAPES) ×1 IMPLANT
DRSG TEGADERM 2-3/8X2-3/4 SM (GAUZE/BANDAGES/DRESSINGS) IMPLANT
FIBER LASER MOSES 200 DFL (Laser) IMPLANT
FIBER LASER MOSES 365 DFL (Laser) IMPLANT
GOWN STRL REUS W/ TWL LRG LVL3 (GOWN DISPOSABLE) ×1 IMPLANT
GOWN STRL REUS W/ TWL XL LVL3 (GOWN DISPOSABLE) ×1 IMPLANT
GUIDEWIRE STR DUAL SENSOR (WIRE) ×1 IMPLANT
KIT TURNOVER CYSTO (KITS) ×1 IMPLANT
PACK CYSTO AR (MISCELLANEOUS) ×1 IMPLANT
SET CYSTO W/LG BORE CLAMP LF (SET/KITS/TRAYS/PACK) ×1 IMPLANT
SHEATH NAVIGATOR HD 12/14X36 (SHEATH) IMPLANT
SOL .9 NS 3000ML IRR UROMATIC (IV SOLUTION) ×1 IMPLANT
STENT URET 6FRX24 CONTOUR (STENTS) IMPLANT
STENT URET 6FRX26 CONTOUR (STENTS) IMPLANT
SURGILUBE 2OZ TUBE FLIPTOP (MISCELLANEOUS) ×1 IMPLANT
SYR 10ML LL (SYRINGE) ×1 IMPLANT
VALVE UROSEAL ADJ ENDO (VALVE) IMPLANT
WATER STERILE IRR 500ML POUR (IV SOLUTION) ×1 IMPLANT

## 2023-12-08 NOTE — Interval H&P Note (Signed)
 UROLOGY H&P UPDATE  Agree with prior H&P dated 11/29/2023 by Clotilda Cornwall.  Large left renal stone burden, patient opts for trial of ureteroscopy over PCNL, we discussed imaging findings concerning for matrix like stone that could require PCNL in the future if unable to be managed with ureteroscopy.  Has been on culture appropriate antibiotics, denies any fevers or chills.  Cardiac: RRR Lungs: CTA bilaterally  Laterality: Left Procedure: Left ureteroscopy, laser lithotripsy, stent placement  Urine: Culture 8/20 mixed flora  We specifically discussed the risks ureteroscopy including bleeding, infection/sepsis, stent related symptoms including flank pain/urgency/frequency/incontinence/dysuria, ureteral injury, ureteral stricture, inability to access stone, or need for staged or additional procedures.   Redell JAYSON Burnet, MD 12/08/2023

## 2023-12-08 NOTE — Anesthesia Preprocedure Evaluation (Addendum)
 Anesthesia Evaluation  Patient identified by MRN, date of birth, ID band Patient awake    Reviewed: Allergy & Precautions, NPO status , Patient's Chart, lab work & pertinent test results  History of Anesthesia Complications Negative for: history of anesthetic complications  Airway Mallampati: II  TM Distance: >3 FB Neck ROM: full    Dental no notable dental hx.    Pulmonary neg pulmonary ROS   Pulmonary exam normal        Cardiovascular negative cardio ROS Normal cardiovascular exam     Neuro/Psych  PSYCHIATRIC DISORDERS Anxiety Depression    negative neurological ROS     GI/Hepatic negative GI ROS, Neg liver ROS,,,  Endo/Other  negative endocrine ROS    Renal/GU   negative genitourinary   Musculoskeletal   Abdominal   Peds  Hematology  (+) Blood dyscrasia, anemia   Anesthesia Other Findings Past Medical History: No date: Anemia No date: Anxiety No date: Depression No date: History of kidney stones No date: Kidney stone 05/14/2015: Kidney stone complicating pregnancy  Past Surgical History: 12/28/2015: CYSTOSCOPY W/ RETROGRADES; Left     Comment:  Procedure: CYSTOSCOPY WITH RETROGRADE PYELOGRAM;                Surgeon: Rosina Riis, MD;  Location: [facility] ORS;                Service: Urology;  Laterality: Left; 12/28/2015: CYSTOSCOPY WITH STENT PLACEMENT; Left     Comment:  Procedure: CYSTOSCOPY WITH STENT PLACEMENT;  Surgeon:               Rosina Riis, MD;  Location: [facility] ORS;  Service:               Urology;  Laterality: Left; 12/28/2015: URETEROSCOPY WITH HOLMIUM LASER LITHOTRIPSY; Left     Comment:  Procedure: URETEROSCOPY WITH HOLMIUM LASER LITHOTRIPSY;               Surgeon: Rosina Riis, MD;  Location: [facility] ORS;                Service: Urology;  Laterality: Left; No date: WISDOM TOOTH EXTRACTION     Reproductive/Obstetrics negative OB ROS                               Anesthesia Physical Anesthesia Plan  ASA: 2  Anesthesia Plan: General LMA   Post-op Pain Management:    Induction: Intravenous  PONV Risk Score and Plan: Dexamethasone , Ondansetron , Midazolam  and Treatment may vary due to age or medical condition  Airway Management Planned: Oral ETT  Additional Equipment:   Intra-op Plan:   Post-operative Plan: Extubation in OR  Informed Consent: I have reviewed the patients History and Physical, chart, labs and discussed the procedure including the risks, benefits and alternatives for the proposed anesthesia with the patient or authorized representative who has indicated his/her understanding and acceptance.     Dental Advisory Given  Plan Discussed with: Anesthesiologist, CRNA and Surgeon  Anesthesia Plan Comments: (Patient consented for risks of anesthesia including but not limited to:  - adverse reactions to medications - damage to eyes, teeth, lips or other oral mucosa - nerve damage due to positioning  - sore throat or hoarseness - Damage to heart, brain, nerves, lungs, other parts of body or loss of life  Patient voiced understanding and assent.)         Anesthesia Quick Evaluation

## 2023-12-08 NOTE — Anesthesia Procedure Notes (Addendum)
 Procedure Name: Intubation Date/Time: 12/08/2023 2:55 PM  Performed by: Brien Sotero PARAS, CRNAPre-anesthesia Checklist: Patient identified, Emergency Drugs available, Suction available and Patient being monitored Patient Re-evaluated:Patient Re-evaluated prior to induction Oxygen Delivery Method: Circle system utilized Preoxygenation: Pre-oxygenation with 100% oxygen Induction Type: IV induction Ventilation: Mask ventilation without difficulty Laryngoscope Size: McGrath and 3 Grade View: Grade I Tube type: Oral Tube size: 7.0 mm Number of attempts: 1 Airway Equipment and Method: Stylet and Oral airway Placement Confirmation: ETT inserted through vocal cords under direct vision, positive ETCO2 and breath sounds checked- equal and bilateral Secured at: 20 cm Tube secured with: Tape Dental Injury: Teeth and Oropharynx as per pre-operative assessment

## 2023-12-08 NOTE — Anesthesia Postprocedure Evaluation (Signed)
 Anesthesia Post Note  Patient: Sherry Townsend  Procedure(s) Performed: CYSTOSCOPY/URETEROSCOPY/STENT PLACEMENT (Left)  Patient location during evaluation: PACU Anesthesia Type: General Level of consciousness: awake and alert Pain management: pain level controlled Vital Signs Assessment: post-procedure vital signs reviewed and stable Respiratory status: spontaneous breathing, nonlabored ventilation, respiratory function stable and patient connected to nasal cannula oxygen Cardiovascular status: blood pressure returned to baseline and stable Postop Assessment: no apparent nausea or vomiting Anesthetic complications: no   No notable events documented.   Last Vitals:  Vitals:   12/08/23 1542 12/08/23 1611  BP: 112/76 125/79  Pulse: (!) 116 (!) 112  Resp: 12 18  Temp: (!) 36.2 C (!) 36.2 C  SpO2: 96% 98%    Last Pain:  Vitals:   12/08/23 1611  TempSrc:   PainSc: 0-No pain                 Lendia LITTIE Mae

## 2023-12-08 NOTE — Op Note (Signed)
 Date of procedure: 12/08/23  Preoperative diagnosis:  Left ureteral stone Left renal stone  Postoperative diagnosis:  Left ureteral stone Left renal stone Urinary tract infection  Procedure: Cystoscopy, left diagnostic ureteroscopy, left retrograde pyelogram with intraoperative interpretation, left ureteral stent placement  Surgeon: Redell Burnet, MD  Anesthesia: General  Complications: None  Intraoperative findings:  Normal bladder Frankly purulent urine within the left kidney and stent placed   EBL: None  Specimens: None  Drains: 6 French by 24 cm ureteral stent  Indication: Sherry Townsend is a 33 y.o. patient with left ureteral stone and left renal stone, recently treated for multiple UTI, has been on antibiotics and here today for ureteroscopy and laser lithotripsy.  After reviewing the management options for treatment, they elected to proceed with the above surgical procedure(s). We have discussed the potential benefits and risks of the procedure, side effects of the proposed treatment, the likelihood of the patient achieving the goals of the procedure, and any potential problems that might occur during the procedure or recuperation. Informed consent has been obtained.  Description of procedure:  The patient was taken to the operating room and general anesthesia was induced. SCDs were placed for DVT prophylaxis. The patient was placed in the dorsal lithotomy position, prepped and draped in the usual sterile fashion, and preoperative antibiotics were administered. A preoperative time-out was performed.   A 21 French rigid cystoscope was used to intubate the urethra and thorough cystoscopy was performed.  The bladder was grossly normal.  A sensor wire was advanced into the left ureteral orifice and passed up to the kidney under fluoroscopic vision.  A 8/10 French dual-lumen ureteral access catheter was advanced over the wire to the proximal ureter and a second safety sensor  wire added.  A 12/14 French by 36 cm ureteral access sheath was then gently advanced over the wire under fluoroscopic vision into the kidney.  A digital single-channel flexible ureteroscope was advanced through the sheath and the urine in the kidney was frankly purulent.  Vision was limited.  I aspirated out some cloudy purulent urine.  Some stone could be visualized within the renal pelvis.  Her temperature was 99.5 and I opted to place a ureteral stent in the setting of residual infection prior to ureteroscopy and laser lithotripsy.  The sheath and ureteroscope were carefully removed, and a 6 [place] by 24 cm ureteral stent was placed uneventfully fluoroscopically with a curl in the renal pelvis as well as in the bladder.  The bladder was drained.  Disposition: Stable to PACU  Plan: Continue antibiotics Will admit if febrile  Redell Burnet, MD

## 2023-12-08 NOTE — Transfer of Care (Signed)
 Immediate Anesthesia Transfer of Care Note  Patient: Sherry Townsend  Procedure(s) Performed: CYSTOSCOPY/URETEROSCOPY/STENT PLACEMENT (Left)  Patient Location: PACU  Anesthesia Type:General  Level of Consciousness: awake  Airway & Oxygen Therapy: Patient Spontanous Breathing  Post-op Assessment: Report given to RN and Post -op Vital signs reviewed and stable  Post vital signs: Reviewed and stable  Last Vitals:  Vitals Value Taken Time  BP 87/71 12/08/23 15:28  Temp    Pulse 115 12/08/23 15:29  Resp 19 12/08/23 15:29  SpO2 93 % 12/08/23 15:29  Vitals shown include unfiled device data.  Last Pain:  Vitals:   12/08/23 1200  TempSrc: Temporal         Complications: No notable events documented.

## 2023-12-09 ENCOUNTER — Encounter: Payer: Self-pay | Admitting: Urology

## 2023-12-12 ENCOUNTER — Other Ambulatory Visit: Payer: Self-pay

## 2023-12-12 ENCOUNTER — Telehealth: Payer: Self-pay

## 2023-12-12 DIAGNOSIS — N201 Calculus of ureter: Secondary | ICD-10-CM

## 2023-12-12 NOTE — Progress Notes (Signed)
   [facility] Surgical Posting Form  Surgery Date: Date: 01/01/2024  Surgeon: Dr. 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

## 2023-12-12 NOTE — Telephone Encounter (Signed)
 Per Dr. Francisca, Patient is to be scheduled for Left Ureteroscopy with Laser Lithotripsy and Stent Exchange   [name] was contacted and possible surgical dates were discussed, Monday September 22nd, 2025 was agreed upon for surgery.   Patient was instructed that Dr. Francisca will require them to provide a pre-op UA & CX prior to surgery. This was ordered and scheduled drop off appointment was made for 12/25/2023.    Patient was directed to call [phone] between 1-3pm the day before surgery to find out surgical arrival time.  Instructions were given not to eat or drink from midnight on the night before surgery and have a driver for the day of surgery. On the surgery day patient was instructed to enter through the Medical Mall entrance of [facility] report the Same Day Surgery desk.   Pre-Admit Testing will be in contact via phone to set up an interview with the anesthesia team to review your history and medications prior to surgery.   Reminder of this information was sent via MyChart to the patient.

## 2023-12-12 NOTE — Progress Notes (Signed)
 Surgical Physician Order Form [facility] [place]   Dr. Francisca, MD   * Scheduling expectation :2-3 week after infection clears   *Length of Case:    *Clearance needed: no   *Anticoagulation Instructions: Hold all anticoagulants   *Aspirin Instructions: Hold Aspirin   *Post-op visit Date/Instructions:   TBD   *Diagnosis: Left UPJ Stone   *Procedure: left Ureteroscopy w/laser lithotripsy & stent Exchange    Additional orders: N/A   -Admit type: OUTpatient   -Anesthesia: General   -VTE Prophylaxis Standing Order SCD's       Other:    -Standing Lab Orders Per Anesthesia     Lab other: Urine Pregnancy   -Standing Test orders EKG/Chest x-ray per Anesthesia             Test other:    - Medications:  Ancef  2gm IV   -Other orders:  N/A

## 2023-12-25 ENCOUNTER — Other Ambulatory Visit

## 2023-12-25 DIAGNOSIS — N201 Calculus of ureter: Secondary | ICD-10-CM

## 2023-12-25 LAB — URINALYSIS, COMPLETE
Bilirubin, UA: NEGATIVE
Glucose, UA: NEGATIVE
Ketones, UA: NEGATIVE
Nitrite, UA: NEGATIVE
Specific Gravity, UA: 1.025 (ref 1.005–1.030)
Urobilinogen, Ur: 0.2 mg/dL (ref 0.2–1.0)
pH, UA: 6 (ref 5.0–7.5)

## 2023-12-25 LAB — MICROSCOPIC EXAMINATION
RBC, Urine: >30 /HPF — AB (ref 0–2)
WBC, UA: >30 /HPF — AB (ref 0–5)

## 2023-12-28 ENCOUNTER — Ambulatory Visit: Payer: Self-pay | Admitting: Urology

## 2023-12-28 LAB — CULTURE, URINE COMPREHENSIVE

## 2023-12-28 MED ORDER — CIPROFLOXACIN HCL 500 MG PO TABS: 500.0000 mg | ORAL_TABLET | Freq: Two times a day (BID) | ORAL | 0 refills | Status: DC

## 2023-12-28 NOTE — Telephone Encounter (Signed)
 Left detailed message on patients voicemail for the need to start Cipro . I have sent to patients pharmacy, will also send MyChart notification as well.

## 2023-12-28 NOTE — Telephone Encounter (Signed)
-----   Message from Redell JAYSON Burnet sent at 12/28/2023  2:14 PM EDT ----- Still a small amount of bacteria in the urine.  Please add Cipro  500 mg twice daily x 7 days to her current antibiotics of cefdinir , thanks  Redell Burnet, MD 12/28/2023  ----- Message ----- From: Interface, [facility] Lab Results In Sent: 12/25/2023  11:35 AM EDT To: Redell JAYSON Burnet, MD

## 2023-12-29 ENCOUNTER — Other Ambulatory Visit: Payer: Self-pay

## 2023-12-29 ENCOUNTER — Encounter
Admission: RE | Admit: 2023-12-29 | Discharge: 2023-12-29 | Disposition: A | Discharge status: Home / Self Care | Source: Ambulatory Visit | Attending: Urology | Admitting: Urology

## 2023-12-29 ENCOUNTER — Ambulatory Visit

## 2023-12-29 DIAGNOSIS — Z01812 Encounter for preprocedural laboratory examination: Secondary | ICD-10-CM

## 2023-12-29 NOTE — Patient Instructions (Addendum)
 Your procedure is scheduled on: 01/01/24 Report to the Registration Desk on the 1st floor of the Medical Mall. To find out your arrival time, please call [phone] between 1PM - 3PM on: 12/29/23 If your arrival time is 6:00 am, do not arrive before that time as the Medical Mall entrance doors do not open until 6:00 am.  REMEMBER: Instructions that are not followed completely may result in serious medical risk, up to and including death; or upon the discretion of your surgeon and anesthesiologist your surgery may need to be rescheduled.  Do not eat food or drink any liquids after midnight the night before surgery.  No gum chewing or hard candies.  One week prior to surgery: Stop Anti-inflammatories (NSAIDS) such as Advil , Aleve, Ibuprofen , Motrin , Naproxen, Naprosyn and Aspirin based products such as Excedrin, Goody's Powder, BC Powder. You may take Tylenol  if needed for pain up until the day of surgery.  Stop ANY OVER THE COUNTER supplements until after surgery.  ON THE DAY OF SURGERY ONLY TAKE THESE MEDICATIONS WITH SIPS OF WATER:  buPROPion (WELLBUTRIN XL)  escitalopram  (LEXAPRO )    No Alcohol for 24 hours before or after surgery.  No Smoking including e-cigarettes for 24 hours before surgery.  No chewable tobacco products for at least 6 hours before surgery.  No nicotine patches on the day of surgery.  Do not use any recreational drugs for at least a week (preferably 2 weeks) before your surgery.  Please be advised that the combination of cocaine and anesthesia may have negative outcomes, up to and including death. If you test positive for cocaine, your surgery will be cancelled.  On the morning of surgery brush your teeth with toothpaste and water, you may rinse your mouth with mouthwash if you wish. Do not swallow any toothpaste or mouthwash.  Do not wear jewelry, make-up, hairpins, clips or nail polish.  For welded (permanent) jewelry: bracelets, anklets, waist  bands, etc.  Please have this removed prior to surgery.  If it is not removed, there is a chance that hospital personnel will need to cut it off on the day of surgery.  Do not wear lotions, powders, or perfumes.   Do not shave body hair from the neck down 48 hours before surgery.  Contact lenses, hearing aids and dentures may not be worn into surgery.  Do not bring valuables to the hospital. [facility] is not responsible for any missing/lost belongings or valuables.   Notify your doctor if there is any change in your medical condition (cold, fever, infection).  Wear comfortable clothing (specific to your surgery type) to the hospital.  After surgery, you can help prevent lung complications by doing breathing exercises.  Take deep breaths and cough every 1-2 hours. Your doctor may order a device called an Incentive Spirometer to help you take deep breaths.  When coughing or sneezing, hold a pillow firmly against your incision with both hands. This is called "splinting." Doing this helps protect your incision. It also decreases belly discomfort.  If you are being admitted to the hospital overnight, leave your suitcase in the car. After surgery it may be brought to your room.  In case of increased patient census, it may be necessary for you, the patient, to continue your postoperative care in the Same Day Surgery department.  If you are being discharged the day of surgery, you will not be allowed to drive home. You will need a responsible individual to drive you home and stay with  you for 24 hours after surgery.   If you are taking public transportation, you will need to have a responsible individual with you.  Please call the Pre-admissions Testing Dept. at [phone] if you have any questions about these instructions.  Surgery Visitation Policy:  Patients having surgery or a procedure may have two visitors.  Children under the age of 19 must have an adult with them who is not the  patient.  Inpatient Visitation:    Visiting hours are 7 a.m. to 8 p.m. Up to four visitors are allowed at one time in a patient room. The visitors may rotate out with other people during the day.  One visitor age 80 or older may stay with the patient overnight and must be in the room by 8 p.m.   Merchandiser, retail to address health-related social needs:  [email or web address]

## 2024-01-01 ENCOUNTER — Ambulatory Visit: Admitting: Anesthesiology

## 2024-01-01 ENCOUNTER — Ambulatory Visit

## 2024-01-01 ENCOUNTER — Encounter: Payer: Self-pay | Admitting: Urology

## 2024-01-01 ENCOUNTER — Other Ambulatory Visit: Payer: Self-pay

## 2024-01-01 ENCOUNTER — Encounter
Admission: RE | Disposition: A | Discharge status: Home / Self Care | Payer: Self-pay | Source: Home / Self Care | Attending: Urology

## 2024-01-01 ENCOUNTER — Ambulatory Visit
Admission: RE | Admit: 2024-01-01 | Discharge: 2024-01-01 | Disposition: A | Discharge status: Home / Self Care | Attending: Urology | Admitting: Urology

## 2024-01-01 DIAGNOSIS — N201 Calculus of ureter: Secondary | ICD-10-CM

## 2024-01-01 DIAGNOSIS — N200 Calculus of kidney: Secondary | ICD-10-CM

## 2024-01-01 DIAGNOSIS — Z87442 Personal history of urinary calculi: Secondary | ICD-10-CM | POA: Insufficient documentation

## 2024-01-01 DIAGNOSIS — Z01812 Encounter for preprocedural laboratory examination: Secondary | ICD-10-CM

## 2024-01-01 HISTORY — PX: CYSTOSCOPY/URETEROSCOPY/HOLMIUM LASER/STENT PLACEMENT: SHX6546

## 2024-01-01 HISTORY — PX: CYSTOSCOPY W/ RETROGRADES: SHX1426

## 2024-01-01 LAB — POCT PREGNANCY, URINE: Preg Test, Ur: NEGATIVE

## 2024-01-01 SURGERY — CYSTOSCOPY/URETEROSCOPY/HOLMIUM LASER/STENT PLACEMENT
Anesthesia: General | Laterality: Left

## 2024-01-01 MED ORDER — OXYCODONE HCL 5 MG/5ML PO SOLN: 5.0000 mg | Freq: Once | ORAL | Status: DC | PRN

## 2024-01-01 MED ORDER — FENTANYL CITRATE (PF) 100 MCG/2ML IJ SOLN
INTRAMUSCULAR | Status: DC | PRN
  Administered 2024-01-01: 50 ug via INTRAVENOUS

## 2024-01-01 MED ORDER — LACTATED RINGERS IV SOLN
INTRAVENOUS | Status: DC
Start: 2024-01-01 — End: 2024-01-01

## 2024-01-01 MED ORDER — LIDOCAINE HCL (PF) 2 % IJ SOLN
INTRAMUSCULAR | Status: AC
Start: 2024-01-01 — End: 2024-01-01
  Filled 2024-01-01: qty 5

## 2024-01-01 MED ORDER — ROCURONIUM BROMIDE 100 MG/10ML IV SOLN
INTRAVENOUS | Status: DC | PRN
  Administered 2024-01-01: 50 mg via INTRAVENOUS

## 2024-01-01 MED ORDER — DEXAMETHASONE SODIUM PHOSPHATE 10 MG/ML IJ SOLN
INTRAMUSCULAR | Status: DC | PRN
  Administered 2024-01-01: 55 mg via INTRAVENOUS

## 2024-01-01 MED ORDER — CIPROFLOXACIN HCL 500 MG PO TABS: 500.0000 mg | ORAL_TABLET | Freq: Two times a day (BID) | ORAL | 1 refills | Status: AC

## 2024-01-01 MED ORDER — ROCURONIUM BROMIDE 10 MG/ML (PF) SYRINGE
PREFILLED_SYRINGE | INTRAVENOUS | Status: AC
Start: 2024-01-01 — End: 2024-01-01
  Filled 2024-01-01: qty 10

## 2024-01-01 MED ORDER — ORAL CARE MOUTH RINSE: 15.0000 mL | Freq: Once | OROMUCOSAL | Status: AC

## 2024-01-01 MED ORDER — CEFAZOLIN SODIUM-DEXTROSE 2-4 GM/100ML-% IV SOLN
INTRAVENOUS | Status: AC
  Filled 2024-01-01: qty 100

## 2024-01-01 MED ORDER — SODIUM CHLORIDE 0.9 % IR SOLN
Status: DC | PRN
  Administered 2024-01-01: 3000 mL via INTRAVESICAL

## 2024-01-01 MED ORDER — FENTANYL CITRATE (PF) 100 MCG/2ML IJ SOLN: 25.0000 ug | INTRAMUSCULAR | Status: DC | PRN

## 2024-01-01 MED ORDER — PHENYLEPHRINE 80 MCG/ML (10ML) SYRINGE FOR IV PUSH (FOR BLOOD PRESSURE SUPPORT)
PREFILLED_SYRINGE | INTRAVENOUS | Status: DC | PRN
  Administered 2024-01-01: 80 ug via INTRAVENOUS

## 2024-01-01 MED ORDER — PROPOFOL 10 MG/ML IV BOLUS
INTRAVENOUS | Status: DC | PRN
  Administered 2024-01-01: 140 mg via INTRAVENOUS

## 2024-01-01 MED ORDER — ONDANSETRON HCL 4 MG/2ML IJ SOLN
INTRAMUSCULAR | Status: DC | PRN
  Administered 2024-01-01: 4 mg via INTRAVENOUS

## 2024-01-01 MED ORDER — SUGAMMADEX SODIUM 200 MG/2ML IV SOLN
INTRAVENOUS | Status: DC | PRN
  Administered 2024-01-01: 200 mg via INTRAVENOUS

## 2024-01-01 MED ORDER — MIDAZOLAM HCL 2 MG/2ML IJ SOLN
INTRAMUSCULAR | Status: DC | PRN
  Administered 2024-01-01: 2 mg via INTRAVENOUS

## 2024-01-01 MED ORDER — FENTANYL CITRATE (PF) 100 MCG/2ML IJ SOLN
INTRAMUSCULAR | Status: AC
  Filled 2024-01-01: qty 2

## 2024-01-01 MED ORDER — IOHEXOL 180 MG/ML  SOLN
INTRAMUSCULAR | Status: DC | PRN
  Administered 2024-01-01: 10 mL

## 2024-01-01 MED ORDER — LIDOCAINE HCL (CARDIAC) PF 100 MG/5ML IV SOSY
PREFILLED_SYRINGE | INTRAVENOUS | Status: DC | PRN
  Administered 2024-01-01: 80 mg via INTRAVENOUS

## 2024-01-01 MED ORDER — CHLORHEXIDINE GLUCONATE 0.12 % MT SOLN
OROMUCOSAL | Status: AC
Start: 2024-01-01 — End: 2024-01-01
  Filled 2024-01-01: qty 15

## 2024-01-01 MED ORDER — MIDAZOLAM HCL 2 MG/2ML IJ SOLN
INTRAMUSCULAR | Status: AC
  Filled 2024-01-01: qty 2

## 2024-01-01 MED ORDER — OXYCODONE HCL 5 MG PO TABS: 5.0000 mg | ORAL_TABLET | Freq: Once | ORAL | Status: DC | PRN

## 2024-01-01 MED ORDER — CEFAZOLIN SODIUM-DEXTROSE 2-3 GM-%(50ML) IV SOLR
INTRAVENOUS | Status: DC | PRN
  Administered 2024-01-01: 2 g via INTRAVENOUS

## 2024-01-01 MED ORDER — CEFAZOLIN SODIUM-DEXTROSE 2-4 GM/100ML-% IV SOLN: 2.0000 g | INTRAVENOUS | Status: DC

## 2024-01-01 MED ORDER — CHLORHEXIDINE GLUCONATE 0.12 % MT SOLN
15.0000 mL | Freq: Once | OROMUCOSAL | Status: AC
  Administered 2024-01-01: 15 mL via OROMUCOSAL

## 2024-01-01 SURGICAL SUPPLY — 23 items
ADHESIVE MASTISOL STRL (MISCELLANEOUS) IMPLANT
BAG DRAIN SIEMENS DORNER NS (MISCELLANEOUS) ×2 IMPLANT
BASKET ZERO TIP 1.9FR (BASKET) IMPLANT
BRUSH SCRUB EZ 4% CHG (MISCELLANEOUS) ×2 IMPLANT
CATH URET FLEX-TIP 2 LUMEN 10F (CATHETERS) IMPLANT
CATH URETL OPEN 5X70 (CATHETERS) IMPLANT
DRAPE UTILITY 15X26 TOWEL STRL (DRAPES) ×2 IMPLANT
DRSG TEGADERM 2-3/8X2-3/4 SM (GAUZE/BANDAGES/DRESSINGS) IMPLANT
FIBER LASER MOSES 365 DFL (Laser) IMPLANT
GOWN STRL REUS W/ TWL LRG LVL3 (GOWN DISPOSABLE) ×2 IMPLANT
GOWN STRL REUS W/ TWL XL LVL3 (GOWN DISPOSABLE) ×2 IMPLANT
GUIDEWIRE STR DUAL SENSOR (WIRE) ×2 IMPLANT
KIT TURNOVER CYSTO (KITS) ×2 IMPLANT
PACK CYSTO AR (MISCELLANEOUS) ×2 IMPLANT
SET CYSTO W/LG BORE CLAMP LF (SET/KITS/TRAYS/PACK) ×2 IMPLANT
SHEATH NAVIGATOR HD 12/14X36 (SHEATH) IMPLANT
SOL .9 NS 3000ML IRR UROMATIC (IV SOLUTION) ×2 IMPLANT
STENT URET 6FRX24 CONTOUR (STENTS) IMPLANT
STENT URET 6FRX26 CONTOUR (STENTS) IMPLANT
SURGILUBE 2OZ TUBE FLIPTOP (MISCELLANEOUS) ×2 IMPLANT
SYR 10ML LL (SYRINGE) ×2 IMPLANT
VALVE UROSEAL ADJ ENDO (VALVE) IMPLANT
WATER STERILE IRR 500ML POUR (IV SOLUTION) ×2 IMPLANT

## 2024-01-01 NOTE — Op Note (Signed)
 Date of procedure: 01/01/24  Preoperative diagnosis:  Left renal stone  Postoperative diagnosis:  Same  Procedure: Cystoscopy, left ureteroscopy, laser lithotripsy, left retrograde pyelogram with intraoperative interpretation, left ureteral stent placement  Surgeon: Redell Burnet, MD  Anesthesia: General  Complications: None  Intraoperative findings:  Normal bladder Matrix like left renal stone, dusted extensively, multiple matrix like fragments extracted Uncomplicated stent placement  EBL: Minimal  Specimens: Stone for analysis  Drains: Left 6 French by 24 cm ureteral stent  Indication: Sherry Townsend is a 33 y.o. patient with left renal stone and recurrent infections, previously stented for pyelonephritis, here today for definitive management with ureteroscopy.  After reviewing the management options for treatment, they elected to proceed with the above surgical procedure(s). We have discussed the potential benefits and risks of the procedure, side effects of the proposed treatment, the likelihood of the patient achieving the goals of the procedure, and any potential problems that might occur during the procedure or recuperation. Informed consent has been obtained.  Description of procedure:  The patient was taken to the operating room and general anesthesia was induced. SCDs were placed for DVT prophylaxis. The patient was placed in the dorsal lithotomy position, prepped and draped in the usual sterile fashion, and preoperative antibiotics were administered. A preoperative time-out was performed.   A 21 French rigid cystoscope was used to intubate the urethra and thorough cystoscopy was performed.  The bladder was grossly normal.  The left ureteral stent was grasped and pulled to the meatus and a sensor wire advanced through the stent up to the kidney, the old stent was removed.  A dual-lumen 8/10 French ureteral access catheter was advanced over the wire, and a second sensor  safety wire was added.  A 12/14 French by 35 cm ureteral access sheath was then gently advanced over the wire up to the kidney under fluoroscopic vision.  Thorough pyeloscopy revealed 2 moderate-sized stones that were matrix like and fibrinous.  A 365 m laser fiber and settings of 0.5 J and 80 Hz was used to methodically dust the stones.  A basket was advanced and multiple fibrinous pieces were basket extracted free.  Thorough pyeloscopy revealed no stone fragments larger than the laser fiber.  A retrograde pyelogram performed from the proximal ureter showed no extravasation or filling defects.  A 6 French by 24 cm ureteral stent was placed uneventfully with a curl in the kidney as well as in the bladder and the bladder was drained.  Disposition: Stable to PACU  Plan: Stent removal in 1 week in clinic  Redell Burnet, MD

## 2024-01-01 NOTE — H&P (Signed)
   01/01/24 10:05 AM   Sherry Townsend [birth year] [id number]  CC: Left renal stone, recurrent UTI  HPI: 33 year old female with large left renal stone, recurrent UTIs, recently stented for pyelonephritis, here today for definitive treatment with ureteroscopy.  She has felt significantly better since stent placement and most recent course of antibiotics.   PMH: Past Medical History:  Diagnosis Date   Anemia    Anxiety    Depression    History of kidney stones    Kidney stone    Kidney stone complicating pregnancy 05/14/2015    Surgical History: Past Surgical History:  Procedure Laterality Date   CYSTOSCOPY W/ RETROGRADES Left 12/28/2015   Procedure: CYSTOSCOPY WITH RETROGRADE PYELOGRAM;  Surgeon: Rosina Riis, MD;  Location: [facility];  Service: Urology;  Laterality: Left;   CYSTOSCOPY WITH STENT PLACEMENT Left 12/28/2015   Procedure: CYSTOSCOPY WITH STENT PLACEMENT;  Surgeon: Rosina Riis, MD;  Location: [facility];  Service: Urology;  Laterality: Left;   CYSTOSCOPY/URETEROSCOPY/HOLMIUM LASER/STENT PLACEMENT Left 12/08/2023   Procedure: CYSTOSCOPY/URETEROSCOPY/STENT PLACEMENT;  Surgeon: Francisca Redell BROCKS, MD;  Location: [facility];  Service: Urology;  Laterality: Left;   URETEROSCOPY WITH HOLMIUM LASER LITHOTRIPSY Left 12/28/2015   Procedure: URETEROSCOPY WITH HOLMIUM LASER LITHOTRIPSY;  Surgeon: Rosina Riis, MD;  Location: [facility];  Service: Urology;  Laterality: Left;   WISDOM TOOTH EXTRACTION        Family History: Family History  Problem Relation Age of Onset   Nephrolithiasis Maternal Grandfather    Nephrolithiasis Maternal Aunt    Heart disease Maternal Grandmother    Bladder Cancer Paternal Grandmother     Social History:  reports that she has never smoked. She has never used smokeless tobacco. She reports that she does not currently use alcohol. She reports that she does not use drugs.  Physical Exam: BP (!) 126/97   Pulse 85   Temp 98.7 F (37.1 C)  (Temporal)   Resp 17   Ht 5' 4 (1.626 m)   Wt 74.8 kg   LMP  (LMP Unknown)   SpO2 100%   Breastfeeding Yes   BMI 28.32 kg/m    Constitutional:  Alert and oriented, No acute distress. Cardiovascular: Regular rate and rhythm Respiratory: Clear to auscultation bilaterally GI: Abdomen is soft, nontender, nondistended, no abdominal masses   Laboratory Data: Culture 9/15 25-50k E. coli, on culture appropriate Cipro   Assessment & Plan:   33 year old female with large left renal stone, previously stented for infection, here today for definitive management with ureteroscopy and laser lithotripsy.  We specifically discussed the risks ureteroscopy including bleeding, infection/sepsis, stent related symptoms including flank pain/urgency/frequency/incontinence/dysuria, ureteral injury, ureteral stricture, inability to access stone, or need for staged or additional procedures.  Left ureteroscopy, laser lithotripsy, stent change  Redell Francisca, MD 01/01/2024  [facility] [address], [address] [place], [place] [address] [phone]

## 2024-01-01 NOTE — Anesthesia Postprocedure Evaluation (Signed)
 Anesthesia Post Note  Patient: Sherry Townsend  Procedure(s) Performed: CYSTOSCOPY/URETEROSCOPY/HOLMIUM LASER/STENT PLACEMENT (Left) CYSTOSCOPY, WITH RETROGRADE PYELOGRAM  Patient location during evaluation: PACU Anesthesia Type: General Level of consciousness: awake and alert Pain management: pain level controlled Vital Signs Assessment: post-procedure vital signs reviewed and stable Respiratory status: spontaneous breathing, nonlabored ventilation and respiratory function stable Cardiovascular status: blood pressure returned to baseline and stable Postop Assessment: no apparent nausea or vomiting Anesthetic complications: no   There were no known notable events for this encounter.   Last Vitals:  Vitals:   01/01/24 1145 01/01/24 1203  BP: (!) 127/90 (!) 131/95  Pulse: 82 78  Resp: 14 16  Temp:  (!) 36.3 C  SpO2: 100% 98%    Last Pain:  Vitals:   01/01/24 1203  TempSrc: Temporal  PainSc: 0-No pain                 Fairy POUR Alexiya Franqui

## 2024-01-01 NOTE — Anesthesia Preprocedure Evaluation (Signed)
 Anesthesia Evaluation  Patient identified by MRN, date of birth, ID band Patient awake    Reviewed: Allergy & Precautions, NPO status , Patient's Chart, lab work & pertinent test results  History of Anesthesia Complications Negative for: history of anesthetic complications  Airway Mallampati: III  TM Distance: >3 FB Neck ROM: full    Dental  (+) Chipped   Pulmonary neg pulmonary ROS, neg shortness of breath   Pulmonary exam normal        Cardiovascular Exercise Tolerance: Good (-) angina (-) Past MI negative cardio ROS Normal cardiovascular exam     Neuro/Psych negative neurological ROS     GI/Hepatic negative GI ROS, Neg liver ROS,neg GERD  ,,  Endo/Other  negative endocrine ROS    Renal/GU Renal disease     Musculoskeletal   Abdominal   Peds  Hematology negative hematology ROS (+)   Anesthesia Other Findings Past Medical History: No date: Anemia No date: Anxiety No date: Depression No date: History of kidney stones No date: Kidney stone 05/14/2015: Kidney stone complicating pregnancy  Past Surgical History: 12/28/2015: CYSTOSCOPY W/ RETROGRADES; Left     Comment:  Procedure: CYSTOSCOPY WITH RETROGRADE PYELOGRAM;                Surgeon: Rosina Riis, MD;  Location: [facility];                Service: Urology;  Laterality: Left; 12/28/2015: CYSTOSCOPY WITH STENT PLACEMENT; Left     Comment:  Procedure: CYSTOSCOPY WITH STENT PLACEMENT;  Surgeon:               Rosina Riis, MD;  Location: [facility];  Service:               Urology;  Laterality: Left; 12/08/2023: CYSTOSCOPY/URETEROSCOPY/HOLMIUM LASER/STENT PLACEMENT; Left     Comment:  Procedure: CYSTOSCOPY/URETEROSCOPY/STENT PLACEMENT;                Surgeon: Francisca Redell BROCKS, MD;  Location: [facility];                Service: Urology;  Laterality: Left; 12/28/2015: URETEROSCOPY WITH HOLMIUM LASER LITHOTRIPSY; Left     Comment:  Procedure: URETEROSCOPY WITH  HOLMIUM LASER LITHOTRIPSY;               Surgeon: Rosina Riis, MD;  Location: [facility];                Service: Urology;  Laterality: Left; No date: WISDOM TOOTH EXTRACTION  BMI    Body Mass Index: 28.32 kg/m      Reproductive/Obstetrics negative OB ROS                              Anesthesia Physical Anesthesia Plan  ASA: 2  Anesthesia Plan: General ETT   Post-op Pain Management:    Induction: Intravenous  PONV Risk Score and Plan: Ondansetron , Dexamethasone , Midazolam  and Treatment may vary due to age or medical condition  Airway Management Planned: Oral ETT  Additional Equipment:   Intra-op Plan:   Post-operative Plan: Extubation in OR  Informed Consent: I have reviewed the patients History and Physical, chart, labs and discussed the procedure including the risks, benefits and alternatives for the proposed anesthesia with the patient or authorized representative who has indicated his/her understanding and acceptance.     Dental Advisory Given  Plan Discussed with: Anesthesiologist, CRNA and Surgeon  Anesthesia Plan Comments: (Patient consented  for risks of anesthesia including but not limited to:  - adverse reactions to medications - damage to eyes, teeth, lips or other oral mucosa - nerve damage due to positioning  - sore throat or hoarseness - Damage to heart, brain, nerves, lungs, other parts of body or loss of life  Patient voiced understanding and assent.)        Anesthesia Quick Evaluation

## 2024-01-01 NOTE — Anesthesia Procedure Notes (Signed)
 Procedure Name: Intubation Date/Time: 01/01/2024 10:24 AM  Performed by: Veronica Alm BROCKS, CRNAPre-anesthesia Checklist: Patient identified, Patient being monitored, Timeout performed, Emergency Drugs available and Suction available Patient Re-evaluated:Patient Re-evaluated prior to induction Oxygen Delivery Method: Circle system utilized Preoxygenation: Pre-oxygenation with 100% oxygen Induction Type: IV induction Ventilation: Mask ventilation without difficulty Laryngoscope Size: 3 and McGrath Grade View: Grade I Tube type: Oral Tube size: 7.0 mm Number of attempts: 1 Airway Equipment and Method: Stylet Placement Confirmation: ETT inserted through vocal cords under direct vision, positive ETCO2 and breath sounds checked- equal and bilateral Secured at: 21 cm Tube secured with: Tape Dental Injury: Teeth and Oropharynx as per pre-operative assessment

## 2024-01-01 NOTE — Transfer of Care (Signed)
 Immediate Anesthesia Transfer of Care Note  Patient: Sherry Townsend  Procedure(s) Performed: CYSTOSCOPY/URETEROSCOPY/HOLMIUM LASER/STENT PLACEMENT (Left) CYSTOSCOPY, WITH RETROGRADE PYELOGRAM  Patient Location: PACU  Anesthesia Type:General  Level of Consciousness: drowsy  Airway & Oxygen Therapy: Patient Spontanous Breathing and Patient connected to face mask oxygen  Post-op Assessment: Report given to RN and Post -op Vital signs reviewed and stable  Post vital signs: Reviewed  Last Vitals:  Vitals Value Taken Time  BP 129/93 01/01/24 11:19  Temp    Pulse 97 01/01/24 11:20  Resp 27 01/01/24 11:20  SpO2 99 % 01/01/24 11:20  Vitals shown include unfiled device data.  Last Pain:  Vitals:   01/01/24 0902  TempSrc: Temporal  PainSc: 0-No pain         Complications: There were no known notable events for this encounter.

## 2024-01-02 ENCOUNTER — Encounter: Payer: Self-pay | Admitting: Urology

## 2024-01-09 ENCOUNTER — Ambulatory Visit (INDEPENDENT_AMBULATORY_CARE_PROVIDER_SITE_OTHER): Admitting: Urology

## 2024-01-09 VITALS — BP 127/85 | HR 97 | Wt 162.0 lb

## 2024-01-09 DIAGNOSIS — N200 Calculus of kidney: Secondary | ICD-10-CM | POA: Diagnosis not present

## 2024-01-09 MED ORDER — LIDOCAINE HCL URETHRAL/MUCOSAL 2 % EX GEL
1.0000 | Freq: Once | CUTANEOUS | Status: AC
  Administered 2024-01-09: 1 via URETHRAL

## 2024-01-09 NOTE — Progress Notes (Signed)
 Cystoscopy Procedure Note:  Indication: Stent removal s/p 01/01/2024 left ureteroscopy and laser lithotripsy 1.5 cm matrix like renal stone  Currently on cipro  from pre-op  After informed consent and discussion of the procedure and its risks, Sherry Townsend was positioned and prepped in the standard fashion. Cystoscopy was performed with a flexible cystoscope. The stent was grasped with flexible graspers and removed in its entirety. The patient tolerated the procedure well.  Findings: Uncomplicated stent removal  Assessment and Plan: Stone analysis pending 24 hr urine metabolic work up, call w results RTC 1 yr PA KUB  Redell JAYSON Burnet, MD 01/09/2024

## 2024-01-09 NOTE — Patient Instructions (Addendum)

## 2024-01-10 LAB — STONE ANALYSIS
Calcium Oxalate Dihydrate: 20 %
Calcium Oxalate Monohydrate: 30 %
Calcium Phosphate (Hydroxyl): 50 %
Weight Calculi: 4 mg

## 2024-01-30 ENCOUNTER — Encounter: Payer: Self-pay | Admitting: Family Medicine

## 2024-02-17 LAB — LITHOLINK 24HR URINE PANEL
Ammonium, Urine: 22 mmol/(24.h) (ref 15–60)
Calcium Oxalate Saturation: 2.81 — ABNORMAL LOW (ref 6.00–10.00)
Calcium Phosphate Saturation: 0.34 — ABNORMAL LOW (ref 0.50–2.00)
Calcium, Urine: 113 mg/(24.h) (ref <200)
Calcium/Creatinine Ratio: 91 mg/g{creat} (ref 51–262)
Calcium/Kg Body Weight: 1.6 mg/kg/d (ref <4.0)
Chloride, Urine: 216 mmol/(24.h) (ref 70–250)
Citrate, Urine: 258 mg/(24.h) — ABNORMAL LOW (ref >550)
Creatinine, Urine: 1233 mg/(24.h)
Creatinine/Kg Body Weight: 17 mg/kg/d (ref 8.7–20.3)
Magnesium, Urine: 54 mg/(24.h) (ref 30–120)
Oxalate, Urine: 26 mg/(24.h) (ref 20–40)
Phosphorus, Urine: 683 mg/(24.h) (ref 600–1200)
Potassium, Urine: 42 mmol/(24.h) (ref 20–100)
Protein Catabolic Rate: 0.7 g/kg/d — ABNORMAL LOW (ref 0.8–1.4)
Sodium, Urine: 210 mmol/(24.h) — ABNORMAL HIGH (ref 50–150)
Sulfate, Urine: 21 meq/(24.h) (ref 20–80)
Urea Nitrogen, Urine: 6.51 g/(24.h) (ref 6.00–14.00)
Uric Acid Saturation: 0.49 (ref <1.00)
Uric Acid, Urine: 571 mg/(24.h) (ref <750)
Urine Volume (Preserved): 2230 mL/(24.h) (ref 500–4000)
pH, 24 hr, Urine: 6.029 (ref 5.800–6.200)

## 2024-02-19 ENCOUNTER — Ambulatory Visit: Payer: Self-pay | Admitting: Urology

## 2024-03-21 NOTE — Progress Notes (Signed)
 Telemedicine video encounter documentation  This video encounter was conducted with the patient's (or proxy's) verbal consent via secure, interactive audio and video telecommunications while in clinic/office/hospital.  The patient (or proxy) was instructed to have this encounter in a suitably private space and to only have persons present to whom they give permission to participate. In addition, patient identity was confirmed by use of name plus two identifiers.   [facility] Problem Visit  CC: medication refill  History of Present Illness Sherry Townsend is a 33 year old [id number] female with anxiety and depression who presents for a medication refill.  She is seeking a refill for Wellbutrin 300 mg once daily and Lexapro  20 mg once daily, which she uses to manage her anxiety and depression. She has not taken Lexapro  for about a month and reports that she can tell a difference in her mood without the medication.  She has a history of anxiety and depression, exacerbated by the loss of her son in 2022. She recently had another child in June and is uncertain if her current symptoms are due to anxiety, depression, postpartum depression, or a combination of these conditions.  No recent thoughts of self-harm, but she acknowledges having such thoughts in the past. No thoughts of harming others. She is currently seeing a therapist every other week and has discussed a safety plan with her therapist.   Review of Systems: Pertinent positive/negative documented in the HPI, all other systems reviewed and negative.     History reviewed in detail and updated: Past Medical History:  Past Medical History:  Diagnosis Date   Abnormal cervical Papanicolaou smear    Anxiety 2022   After losing my infant son.   Depression 2022   After losing my infant son   Kidney stones    Psychological trauma 2022   Loss of son    Past Surgical History:   Past Surgical History:   Procedure Laterality Date   EXTRACTION TEETH     widsom teeth   laser kidney stone removal      Past Obstetric History:  OB History     Gravida  4   Para  3   Term  3   Preterm  0   AB  1   Living  2      SAB  1   IAB  0   Ectopic  0   Molar  0   Multiple      Live Births  3         Past Family History:  family history includes Cancer in her paternal grandmother; Diabetes in her maternal grandmother and paternal grandfather; Fibroids in her mother; Genetic problem/disorder in her father; Heart disease in her maternal grandmother and paternal grandfather; High blood pressure (Hypertension) in her father; Kidney disease in her maternal grandfather; Mental illness in her paternal aunt; No Known Problems in her brother and brother; Sleep apnea in her father.    Past Social History:  Social History   Socioeconomic History   Marital status: Married  Tobacco Use   Smoking status: Never   Smokeless tobacco: Never  Substance and Sexual Activity   Alcohol use: Never   Drug use: Never   Sexual activity: Yes    Partners: Male  Other Topics Concern   Would you please tell us  about the people who live in your home, your pets, or anything else important to your social life? Yes    Comment: Donnice  Quin Kennyth Quin, new position at work  Social History Narrative   ** Merged History Encounter **       Social Drivers of Corporate Investment Banker Strain: Low Risk  (02/23/2023)   Overall Financial Resource Strain (CARDIA)    Difficulty of Paying Living Expenses: Not hard at all  Food Insecurity: Low Risk (09/28/2023)   Received from [facility]   Food Insecurity    Within the past 12 months, did the food you bought just not last and you didn't have money to get more?: No    Within the past 12 months, did you worry that your food would run out before you got money to buy more?: No  Transportation Needs: Low Risk (09/28/2023)    Received from [facility]   Transportation Needs    Within the past 12 months, has a lack of transportation kept you from medical appointments or from doing things needed for daily living?: No  Housing Stability: Low Risk (09/28/2023)   Received from [facility]   Housing Stability    Within the past 12 months, have you ever stayed: outside, in a car, in a tent, in an overnight shelter, or temporarily in someone else's home (i.e. couch-surfing)?: No    Are you worried about losing your housing? : No   Allergies:  Ketorolac  tromethamine  Medications:  No current outpatient medications on file prior to visit.   No current facility-administered medications on file prior to visit.    Detailed physical exam performed: Not performed as this was a virtual visit  Assessment/Plan: 33 y.o. [id number] with anxiety/depression  Diagnoses and all orders for this visit:  Encounter for medication refill  Situational mixed anxiety and depressive disorder  Other orders -     buPROPion (WELLBUTRIN XL) 300 MG XL tablet; Take 1 tablet (300 mg total) by mouth once daily -     escitalopram  oxalate (LEXAPRO ) 20 MG tablet; Take 1 tablet (20 mg total) by mouth once daily   Assessment & Plan Depressive and Anxiety Disorders, Postpartum Mood changes noted after discontinuing Lexapro . No current self-harm ideation. Engaged in biweekly therapy. - Refilled Wellbutrin 300 mg once daily and Lexapro  20 mg once daily. - Follow-up in three months to assess mood. - Advised to contact provider if self-harm thoughts return. - Continue biweekly therapy.   Attestation Statement:   I personally performed the service. (TP)  This note has been created using automated tools and reviewed for accuracy by RACHEL LEIGH VISCONTI.  RACHEL LEIGH VISCONTI, DO

## 2025-01-13 ENCOUNTER — Ambulatory Visit: Admitting: Urology
